# Patient Record
Sex: Female | Born: 1955 | Race: Black or African American | Hispanic: No | Marital: Single | Smoking: Former smoker
Health system: Southern US, Community
[De-identification: ages and names within clinical notes are randomized; demographics above are authoritative.]

## PROBLEM LIST (undated history)

## (undated) DIAGNOSIS — K635 Polyp of colon: Secondary | ICD-10-CM

## (undated) DIAGNOSIS — E785 Hyperlipidemia, unspecified: Secondary | ICD-10-CM

## (undated) DIAGNOSIS — Z87442 Personal history of urinary calculi: Secondary | ICD-10-CM

## (undated) DIAGNOSIS — R0600 Dyspnea, unspecified: Secondary | ICD-10-CM

## (undated) DIAGNOSIS — J189 Pneumonia, unspecified organism: Secondary | ICD-10-CM

## (undated) DIAGNOSIS — R911 Solitary pulmonary nodule: Secondary | ICD-10-CM

## (undated) DIAGNOSIS — J302 Other seasonal allergic rhinitis: Secondary | ICD-10-CM

## (undated) DIAGNOSIS — K219 Gastro-esophageal reflux disease without esophagitis: Secondary | ICD-10-CM

## (undated) DIAGNOSIS — I10 Essential (primary) hypertension: Secondary | ICD-10-CM

## (undated) HISTORY — PX: MULTIPLE TOOTH EXTRACTIONS: SHX2053

## (undated) HISTORY — DX: Essential (primary) hypertension: I10

## (undated) HISTORY — DX: Polyp of colon: K63.5

## (undated) HISTORY — PX: OTHER SURGICAL HISTORY: SHX169

---
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
## 1998-02-26 ENCOUNTER — Emergency Department (HOSPITAL_COMMUNITY): Admission: EM | Admit: 1998-02-26 | Discharge: 1998-02-26 | Payer: Self-pay | Admitting: Emergency Medicine

## 1998-08-31 ENCOUNTER — Emergency Department (HOSPITAL_COMMUNITY): Admission: EM | Admit: 1998-08-31 | Discharge: 1998-08-31 | Payer: Self-pay

## 2000-02-23 ENCOUNTER — Emergency Department (HOSPITAL_COMMUNITY): Admission: EM | Admit: 2000-02-23 | Discharge: 2000-02-23 | Payer: Self-pay

## 2001-01-18 ENCOUNTER — Emergency Department (HOSPITAL_COMMUNITY): Admission: EM | Admit: 2001-01-18 | Discharge: 2001-01-18 | Payer: Self-pay

## 2004-06-17 ENCOUNTER — Emergency Department (HOSPITAL_COMMUNITY): Admission: EM | Admit: 2004-06-17 | Discharge: 2004-06-17 | Payer: Self-pay | Admitting: Emergency Medicine

## 2005-03-24 ENCOUNTER — Emergency Department (HOSPITAL_COMMUNITY): Admission: EM | Admit: 2005-03-24 | Discharge: 2005-03-24 | Payer: Self-pay | Admitting: Emergency Medicine

## 2005-04-05 ENCOUNTER — Emergency Department (HOSPITAL_COMMUNITY): Admission: EM | Admit: 2005-04-05 | Discharge: 2005-04-05 | Payer: Self-pay | Admitting: Emergency Medicine

## 2005-07-26 ENCOUNTER — Emergency Department (HOSPITAL_COMMUNITY): Admission: EM | Admit: 2005-07-26 | Discharge: 2005-07-26 | Payer: Self-pay | Admitting: Emergency Medicine

## 2006-03-03 ENCOUNTER — Emergency Department (HOSPITAL_COMMUNITY): Admission: EM | Admit: 2006-03-03 | Discharge: 2006-03-03 | Payer: Self-pay | Admitting: Emergency Medicine

## 2006-06-08 ENCOUNTER — Emergency Department (HOSPITAL_COMMUNITY): Admission: EM | Admit: 2006-06-08 | Discharge: 2006-06-08 | Payer: Self-pay | Admitting: Family Medicine

## 2007-01-31 ENCOUNTER — Emergency Department (HOSPITAL_COMMUNITY): Admission: EM | Admit: 2007-01-31 | Discharge: 2007-01-31 | Payer: Self-pay | Admitting: Emergency Medicine

## 2007-07-26 ENCOUNTER — Emergency Department (HOSPITAL_COMMUNITY): Admission: EM | Admit: 2007-07-26 | Discharge: 2007-07-26 | Payer: Self-pay | Admitting: Emergency Medicine

## 2008-06-07 ENCOUNTER — Ambulatory Visit: Payer: Self-pay | Admitting: Internal Medicine

## 2008-06-07 ENCOUNTER — Inpatient Hospital Stay (HOSPITAL_COMMUNITY): Admission: EM | Admit: 2008-06-07 | Discharge: 2008-06-09 | Payer: Self-pay | Admitting: Emergency Medicine

## 2008-06-08 ENCOUNTER — Encounter: Payer: Self-pay | Admitting: Internal Medicine

## 2008-06-12 ENCOUNTER — Encounter (HOSPITAL_COMMUNITY): Admission: RE | Admit: 2008-06-12 | Discharge: 2008-09-10 | Payer: Self-pay | Admitting: Internal Medicine

## 2008-06-12 ENCOUNTER — Ambulatory Visit: Payer: Self-pay | Admitting: Cardiology

## 2008-08-16 ENCOUNTER — Ambulatory Visit: Payer: Self-pay | Admitting: Internal Medicine

## 2008-08-16 DIAGNOSIS — M25569 Pain in unspecified knee: Secondary | ICD-10-CM | POA: Insufficient documentation

## 2008-08-16 DIAGNOSIS — I10 Essential (primary) hypertension: Secondary | ICD-10-CM | POA: Insufficient documentation

## 2008-08-16 DIAGNOSIS — E785 Hyperlipidemia, unspecified: Secondary | ICD-10-CM

## 2009-01-02 ENCOUNTER — Encounter (INDEPENDENT_AMBULATORY_CARE_PROVIDER_SITE_OTHER): Payer: Self-pay | Admitting: *Deleted

## 2010-07-17 LAB — LIPID PANEL
Cholesterol: 209 mg/dL — ABNORMAL HIGH (ref 0–200)
HDL: 29 mg/dL — ABNORMAL LOW (ref >39)
LDL Cholesterol: 143 mg/dL — ABNORMAL HIGH (ref 0–99)
Triglycerides: 187 mg/dL — ABNORMAL HIGH (ref <150)

## 2010-07-17 LAB — URINALYSIS, ROUTINE W REFLEX MICROSCOPIC
Hgb urine dipstick: NEGATIVE
Ketones, ur: NEGATIVE mg/dL
Protein, ur: NEGATIVE mg/dL
Urobilinogen, UA: 0.2 mg/dL (ref 0.0–1.0)

## 2010-07-17 LAB — COMPREHENSIVE METABOLIC PANEL
Albumin: 4.2 g/dL (ref 3.5–5.2)
BUN: 5 mg/dL — ABNORMAL LOW (ref 6–23)
Creatinine, Ser: 0.7 mg/dL (ref 0.4–1.2)
Total Protein: 7.3 g/dL (ref 6.0–8.3)

## 2010-07-17 LAB — CBC
HCT: 39.4 % (ref 36.0–46.0)
MCV: 94.5 fL (ref 78.0–100.0)
Platelets: 298 10*3/uL (ref 150–400)
RDW: 13.8 % (ref 11.5–15.5)

## 2010-07-17 LAB — TSH: TSH: 2.018 u[IU]/mL (ref 0.350–4.500)

## 2010-07-17 LAB — CK TOTAL AND CKMB (NOT AT ARMC): Total CK: 182 U/L — ABNORMAL HIGH (ref 7–177)

## 2010-07-17 LAB — CARDIAC PANEL(CRET KIN+CKTOT+MB+TROPI): Troponin I: <0.01 ng/mL (ref 0.00–0.06)

## 2010-07-17 LAB — DIFFERENTIAL
Basophils Absolute: 0 10*3/uL (ref 0.0–0.1)
Lymphocytes Relative: 54 % — ABNORMAL HIGH (ref 12–46)
Monocytes Absolute: 0.3 10*3/uL (ref 0.1–1.0)
Monocytes Relative: 5 % (ref 3–12)
Neutro Abs: 2.5 10*3/uL (ref 1.7–7.7)

## 2010-07-17 LAB — POCT CARDIAC MARKERS
CKMB, poc: 1.6 ng/mL (ref 1.0–8.0)
Troponin i, poc: <0.05 ng/mL (ref 0.00–0.09)

## 2010-08-19 NOTE — Consult Note (Signed)
NAMESINDI, Tasha Nguyen                ACCOUNT NO.:  [id number]   MEDICAL RECORD NO.:  [id number]          PATIENT TYPE:  INP   LOCATION:  4712                         FACILITY:  [facility]   PHYSICIAN:  Doylene Canning. Ladona Ridgel, MD    DATE OF BIRTH:  11/28/1955   DATE OF CONSULTATION:  06/07/2008  DATE OF DISCHARGE:                                 CONSULTATION   The patient is referred today by Dr. Flonnie Overman for evaluation of chest pain  and make recommendations about her treatment.   The patient is a 55 year old woman who has a very strong family history  for coronary artery disease.  She also has a history of hypertension,  age undetermined.  She has a history of alcohol abuse and tobacco abuse  remotely.  The patient works 2 jobs and has done so for years.  She only  sleeps 3-4 hours at night.  She noted that over the last several days  that she has noted substernal chest discomfort which radiates to the  right arm.  This has been waxing and waning but worsened today and  resulted in her presenting to the hospital for additional evaluation  where she was treated with nitroglycerin with resolution of her pain.  The patient describes the pain is sharp and radiating to the right arm  and shoulder and axilla.  There is associated nausea, mild diaphoresis,  and shortness of breath.  The symptoms are not related to exertion.  The  patient is now here for additional evaluation.  She has never had frank  syncope.  She has rare palpitations.  She states that nothing makes the  pain worse or better.  She has otherwise been healthy.  Her additional  past medical history is notable for hypertension.  She notes that she  drinks 3-4 alcoholic beverages daily, sometimes more.  She has a history  of tobacco abuse but stopped smoking several months ago.   FAMILY HISTORY:  Father died of an MI in his 67s.  Her mother is alive  and well and healthy.  Her several siblings have had heart disease.   SOCIAL HISTORY:  The  patient is single.  She works 2 jobs.  Her alcohol  and tobacco abuse are previously described.  The patient does admit to  some marijuana use.   REVIEW OF SYSTEMS:  Negative except as noted in the HPI.  All systems  were reviewed and negative except as noted above.   PHYSICAL EXAMINATION:  GENERAL:  Notable for being a pleasant, middle-  aged woman in no acute distress.  VITAL SIGNS:  Blood pressure was initially 160/100, repeat blood  pressure several hours later after nitroglycerin was 130/90; pulse was  80 and regular; respirations were 18-20; the temperature was 98; and  oxygen saturation was 99%.  HEENT:  Normocephalic and atraumatic.  Pupils were equal and round.  Oropharynx is moist.  Sclerae anicteric.  The patient was wearing  glasses.  NECK:  No jugular venous distention.  There is no thyromegaly.  Trachea  was midline.  Carotids are 2+ and symmetric.  LUNGS:  Clear bilaterally to auscultation.  No wheezes, rales, or  rhonchi are present.  There is no increased work of breathing.  CARDIOVASCULAR:  Regular rate and rhythm.  Normal S1 and S2.  There is  soft S4 gallop.  There are no murmurs or rubs.  ABDOMEN:  Soft and  nontender.  There is no organomegaly.  Bowel sounds are present.  There  is no rebound or guarding.  EXTREMITIES:  Demonstrated no cyanosis, clubbing, or edema.  Pulses were  2+ and symmetric.  NEUROLOGIC:  Alert and oriented x3.  Cranial nerves II through XII  intact.  Strength is 5/5 and symmetric.   EKG demonstrates sinus rhythm.  There are no acute ST-T wave changes.  Labs of note include an initial cardiac markers being negative.  Sodium  was 140, potassium 3.7, glucose was 91, and creatinine 0.7.  Her  urinalysis was negative.  Her white blood cell count was normal.  Her  hemoglobin was 13.6.   IMPRESSION:  1. Chest pain with multiple features for unstable angina, also some      atypical features.  2. Hypertension.  3. Strong family history for  heart disease (premature heart disease).   DISCUSSION:  I have recommended that the patient be admitted and  observed on telemetry.  Serial cardiac enzymes and EKGs will be  recommended as well as a 2-D echo.  If her enzymes are negative and her  echo is okay, a stress Myoview will be performed perhaps even as an  outpatient.  I have encouraged the patient stopped drinking alcohol in  excess.      Doylene Canning. Ladona Ridgel, MD  Electronically Signed     GWT/MEDQ  D:  06/07/2008  T:  06/08/2008  Job:  [id number]

## 2010-08-19 NOTE — H&P (Signed)
Tasha Nguyen, Tasha Nguyen                ACCOUNT NO.:  [id number]   MEDICAL RECORD NO.:  [id number]          PATIENT TYPE:  EMS   LOCATION:  MAJO                         FACILITY:  [facility]   PHYSICIAN:  Lucita Ferrara, MD         DATE OF BIRTH:  1955-09-24   DATE OF ADMISSION:  06/07/2008  DATE OF DISCHARGE:                              HISTORY & PHYSICAL   PRIMARY CARE PHYSICIAN:  [facility]/unassigned.   HISTORY OF PRESENT ILLNESS:  The patient is a 55 year old African  American female with a chief complaint of chest pressure and chest  discomfort located in the left anterior precordial area, nonexertional,  but worse when she does exert herself and pressure-like in its  characterization.  It radiates to the shoulder as well, but  nonreproducible.  Not associated with p.o. intake, not associated with  gastroesophageal reflux disease-type symptoms.  There are no fevers,  cough.  Chest discomfort has been on and off for the last month, but  again started yesterday.  The patient's risk factors include a  significant family history of early premature coronary artery disease.  Risk factors include hypertension undiagnosed.  She currently does not smoke tobacco, but smokes cannabis.  She is an  occasional drinker.   SOCIAL HISTORY:  Former smoker, occasional drinker, cannabis abuse.   FAMILY HISTORY:  Significant for premature coronary artery disease and  diabetes.   PAST MEDICAL HISTORY:  None.   ALLERGIES:  NO KNOWN DRUG ALLERGIES.   MEDICATIONS:  None.   REVIEW OF SYSTEMS:  As per HPI, otherwise negative.   PHYSICAL EXAMINATION:  GENERAL:  The patient in no acute distress.  The  pain still persists, however.  VITAL SIGNS:  Blood pressure 164/95, pulse 79, respirations 18,  temperature 98.3, pulse ox 99% on room air.  HEENT:  Normocephalic, atraumatic.  Sclerae anicteric.  Mucous membranes  are moist.  NECK:  Supple.  No JVD.  CARDIOVASCULAR:  S1 and S2.  Regular rate and rhythm.  No  murmurs, rubs  or clicks.  LUNGS:  Clear to auscultation bilaterally.  No rhonchi or wheezes.  ABDOMEN:  Soft, nontender, nondistended.  Positive bowel sounds.  EXTREMITIES:  No clubbing, cyanosis or edema.  NEURO:  Patient is alert and oriented x3.  Cranial nerves II-XII are  grossly intact.   LABORATORY DATA:  Urinalysis negative.  Cardiac enzymes x1 negative.  Complete metabolic panel, AST, ALT, alk phos and electrolytes within  normal limits.  CBC normal.   DIAGNOSTICS:  Chest x-ray:  No cardiopulmonary disease.   ASSESSMENT:  The patient is a 55 year old African American female with  chest pain that is characterized as pressure-like.  Her risk factors  include undiagnosed hypertension, family history of coronary disease.   PLAN:  She will be admitted to the medical telemetry unit.  Cardiac  enzymes x3 q.8 h.  I have already asked [facility] to come and  see her, and proceed with appropriate measures including a possible  nuclear stress test.  Fasting lipid profile and TSH in the morning.  DVT  and GI prophylaxis  with Lovenox and Protonix.      Lucita Ferrara, MD  Electronically Signed     RR/MEDQ  D:  06/07/2008  T:  06/07/2008  Job:  [id number]

## 2010-08-19 NOTE — Discharge Summary (Signed)
Tasha Nguyen, Tasha Nguyen                ACCOUNT NO.:  [id number]   MEDICAL RECORD NO.:  [id number]          PATIENT TYPE:  INP   LOCATION:  4712                         FACILITY:  [facility]   PHYSICIAN:  Michelene Gardener, MD    DATE OF BIRTH:  1956/02/06   DATE OF ADMISSION:  06/07/2008  DATE OF DISCHARGE:  06/09/2008                               DISCHARGE SUMMARY   PRIMARY PHYSICIAN:  [facility].   DISCHARGE DIAGNOSES:  1. Chest pain.  2. Hypertension.  3. Hyperlipidemia, which is diet controlled.  4. Positive family history of heart disease.   DISCHARGE MEDICATIONS:  1. Aspirin 81 mg once a day.  2. Toprol-XL 25 mg once a day.   CONSULTATIONS:  Cardiology consult by Dr. Lewayne Bunting.   PROCEDURES:  None.   RADIOLOGY STUDIES:  1. Chest x-ray on June 07, 2008, showed no active disease.  2. Echocardiogram on June 08, 2008, showed ejection fraction of 55-65%      without evidence of wall motion abnormalities.   COURSE OF HOSPITALIZATION:  1. Chest pain.  This patient had some risk factors that include      hypertension, hyperlipidemia, which is diet controlled and positive      family history.  The patient was admitted to the hospital for      further evaluation.  She was monitored in telemetry and her tele      monitor showed no evidence of arrhythmia.  Three sets of troponin      and cardiac enzymes were negative.  Echocardiogram was done and      showed normal ejection fraction without evidence of wall motion      abnormalities.  Cardiology consultation was done by [facility]      Cardiology.  Cardiology discussed the case with the patient on the      time of discharge, and the patient chose to go home and to come      back for a stress test next week.  Stress test was scheduled on      Tuesday and as per Cardiology that will be reviewed by Dr. Ladona Ridgel.  2. Hypertension.  The patient was started on Toprol-XL that gave      better control.  3. Hyperlipidemia.  That was diet  controlled and the patient was      advised about lifestyle modifications.   Otherwise other medical conditions remained stable.  At the time of  discharge, the patient was in stable condition.  No chest pain and no  shortness of breath.  She was advised to come to the ER if she developed  symptoms such as chest pain or shortness of breath.   TOTAL ASSESSMENT TIME:  40 minutes.      Michelene Gardener, MD  Electronically Signed     NAE/MEDQ  D:  06/09/2008  T:  06/10/2008  Job:  [phone]

## 2010-12-30 LAB — POCT RAPID STREP A: Streptococcus, Group A Screen (Direct): POSITIVE — AB

## 2011-03-17 ENCOUNTER — Other Ambulatory Visit (HOSPITAL_COMMUNITY)
Admission: RE | Admit: 2011-03-17 | Discharge: 2011-03-17 | Disposition: A | Discharge status: Home / Self Care | Payer: BC Managed Care – PPO | Source: Ambulatory Visit | Attending: Family Medicine | Admitting: Family Medicine

## 2011-03-17 DIAGNOSIS — Z01419 Encounter for gynecological examination (general) (routine) without abnormal findings: Secondary | ICD-10-CM | POA: Insufficient documentation

## 2013-10-25 ENCOUNTER — Ambulatory Visit (INDEPENDENT_AMBULATORY_CARE_PROVIDER_SITE_OTHER): Payer: Self-pay | Admitting: Family Medicine

## 2013-10-25 VITALS — BP 138/84 | HR 82 | Temp 98.0°F | Resp 16 | Ht 68.5 in | Wt 132.6 lb

## 2013-10-25 DIAGNOSIS — S41102A Unspecified open wound of left upper arm, initial encounter: Secondary | ICD-10-CM

## 2013-10-25 DIAGNOSIS — M79602 Pain in left arm: Secondary | ICD-10-CM

## 2013-10-25 DIAGNOSIS — S41109A Unspecified open wound of unspecified upper arm, initial encounter: Secondary | ICD-10-CM

## 2013-10-25 DIAGNOSIS — M79609 Pain in unspecified limb: Secondary | ICD-10-CM
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
## 2013-10-25 NOTE — Patient Instructions (Signed)
Keep your arm covered while at work, return in one week to take out the stitches. Please come in sooner if he sees swelling, increasing pain or redness.

## 2013-10-25 NOTE — Progress Notes (Signed)
Patient ID: Tasha Nguyen MRN: [id number], DOB: 11/25/1955, 58 y.o. Date of Encounter: 10/25/2013, 7:11 PM   PROCEDURE NOTE: Verbal consent obtained. Sterile technique employed. Numbing: Anesthesia obtained with 2% lidocaine with epinephrine.   Cleansed with soap and water. Irrigated.  Wound explored, no deep structures involved, no foreign bodies.   Wound repaired with # 5 SI sutures.  Hemostasis obtained. Wound cleansed and dressed.  Wound care instructions including precautions covered with patient. Handout given.  Anticipate suture removal in 7-10 days  Georgiann Mccoy, PA-C 10/25/2013 7:11 PM

## 2013-11-06 ENCOUNTER — Ambulatory Visit (INDEPENDENT_AMBULATORY_CARE_PROVIDER_SITE_OTHER): Payer: Self-pay | Admitting: Physician Assistant

## 2013-11-06 VITALS — BP 100/64 | HR 75 | Temp 98.2°F | Resp 16 | Ht 68.5 in | Wt 134.0 lb

## 2013-11-06 DIAGNOSIS — S41102D Unspecified open wound of left upper arm, subsequent encounter: Secondary | ICD-10-CM

## 2013-11-06 DIAGNOSIS — S41109A Unspecified open wound of unspecified upper arm, initial encounter: Secondary | ICD-10-CM

## 2013-11-06 DIAGNOSIS — Z5189 Encounter for other specified aftercare: Secondary | ICD-10-CM

## 2013-11-06 NOTE — Progress Notes (Signed)
   Subjective:    Patient ID: Tasha Nguyen, female    DOB: June 07, 1955, 58 y.o.   MRN: [id number]  HPI  Pt presents to clinic for suture removal.  He has had no problems with the wound but a little mild tenderness.    Review of Systems     Objective:   Physical Exam  Vitals reviewed. Constitutional: She is oriented to person, place, and time. She appears well-developed and well-nourished.  HENT:  Head: Normocephalic and atraumatic.  Right Ear: External ear normal.  Left Ear: External ear normal.  Pulmonary/Chest: Effort normal.  Neurological: She is alert and oriented to person, place, and time.  Skin: Skin is warm and dry.  3 cm wound healed on arm without signs of infection.  Some mild swelling present.   Sutures removed without difficulty but center of wound slightly dehisced on the epidermis so steri strips placed to prevent opening of the wound.    Assessment & Plan:  Arm wound, left, subsequent encounter  - well healed wound on her arm without signs of infection - wound care d/w pt  Windell Hummingbird PA-C  [facility] 11/06/2013 4:37 PM

## 2013-12-30 ENCOUNTER — Emergency Department (HOSPITAL_COMMUNITY)
Admission: EM | Admit: 2013-12-30 | Discharge: 2013-12-30 | Disposition: A | Discharge status: Home / Self Care | Payer: BC Managed Care – PPO | Attending: Emergency Medicine | Admitting: Emergency Medicine

## 2013-12-30 ENCOUNTER — Emergency Department (HOSPITAL_COMMUNITY): Payer: BC Managed Care – PPO

## 2013-12-30 ENCOUNTER — Encounter (HOSPITAL_COMMUNITY): Payer: Self-pay | Admitting: Emergency Medicine

## 2013-12-30 DIAGNOSIS — S4980XA Other specified injuries of shoulder and upper arm, unspecified arm, initial encounter: Secondary | ICD-10-CM | POA: Insufficient documentation

## 2013-12-30 DIAGNOSIS — M25512 Pain in left shoulder: Secondary | ICD-10-CM

## 2013-12-30 DIAGNOSIS — Y9389 Activity, other specified: Secondary | ICD-10-CM | POA: Insufficient documentation

## 2013-12-30 DIAGNOSIS — S46909A Unspecified injury of unspecified muscle, fascia and tendon at shoulder and upper arm level, unspecified arm, initial encounter: Secondary | ICD-10-CM | POA: Insufficient documentation

## 2013-12-30 DIAGNOSIS — Y92410 Unspecified street and highway as the place of occurrence of the external cause: Secondary | ICD-10-CM | POA: Insufficient documentation

## 2013-12-30 MED ORDER — IBUPROFEN 800 MG PO TABS: 800.0000 mg | ORAL_TABLET | Freq: Three times a day (TID) | ORAL | Status: DC | PRN

## 2013-12-30 MED ORDER — IBUPROFEN 800 MG PO TABS
800.0000 mg | ORAL_TABLET | Freq: Once | ORAL | Status: AC
  Administered 2013-12-30: 800 mg via ORAL
  Filled 2013-12-30: qty 1
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
## 2013-12-30 NOTE — ED Notes (Signed)
Patient transported to X-ray 
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
## 2014-07-12 ENCOUNTER — Encounter (HOSPITAL_COMMUNITY): Payer: Self-pay | Admitting: Emergency Medicine

## 2014-07-12 ENCOUNTER — Emergency Department (HOSPITAL_COMMUNITY)
Admission: EM | Admit: 2014-07-12 | Discharge: 2014-07-13 | Disposition: A | Discharge status: Home / Self Care | Payer: BC Managed Care – PPO | Attending: Emergency Medicine | Admitting: Emergency Medicine

## 2014-07-12 ENCOUNTER — Emergency Department (HOSPITAL_COMMUNITY): Payer: BC Managed Care – PPO

## 2014-07-12 DIAGNOSIS — J209 Acute bronchitis, unspecified: Secondary | ICD-10-CM

## 2014-07-12 DIAGNOSIS — R197 Diarrhea, unspecified: Secondary | ICD-10-CM | POA: Insufficient documentation

## 2014-07-12 DIAGNOSIS — R110 Nausea: Secondary | ICD-10-CM | POA: Insufficient documentation

## 2014-07-12 NOTE — ED Notes (Signed)
Pt states she has had a cough for the past 5 days or so  Pt states this evening she was coughing and coughed up about 2 tablespoons of bright red blood  Pt states she felt like a quiver in her chest then started coughing and that's when the blood came up

## 2014-07-13 LAB — CBC WITH DIFFERENTIAL/PLATELET
Basophils Absolute: 0 10*3/uL (ref 0.0–0.1)
Basophils Relative: 0 % (ref 0–1)
EOS ABS: 0 10*3/uL (ref 0.0–0.7)
EOS PCT: 1 % (ref 0–5)
HCT: 39.4 % (ref 36.0–46.0)
HEMOGLOBIN: 13.1 g/dL (ref 12.0–15.0)
Lymphocytes Relative: 40 % (ref 12–46)
Lymphs Abs: 3.2 10*3/uL (ref 0.7–4.0)
MCH: 32.1 pg (ref 26.0–34.0)
MCHC: 33.2 g/dL (ref 30.0–36.0)
MCV: 96.6 fL (ref 78.0–100.0)
MONO ABS: 0.4 10*3/uL (ref 0.1–1.0)
MONOS PCT: 5 % (ref 3–12)
Neutro Abs: 4.3 10*3/uL (ref 1.7–7.7)
Neutrophils Relative %: 54 % (ref 43–77)
Platelets: 367 10*3/uL (ref 150–400)
RBC: 4.08 MIL/uL (ref 3.87–5.11)
RDW: 13.6 % (ref 11.5–15.5)
WBC: 7.9 10*3/uL (ref 4.0–10.5)

## 2014-07-13 LAB — BASIC METABOLIC PANEL
ANION GAP: 9 (ref 5–15)
BUN: 14 mg/dL (ref 6–23)
CO2: 24 mmol/L (ref 19–32)
Calcium: 9.4 mg/dL (ref 8.4–10.5)
Chloride: 108 mmol/L (ref 96–112)
Creatinine, Ser: 0.6 mg/dL (ref 0.50–1.10)
GFR calc Af Amer: >90 mL/min (ref >90)
Glucose, Bld: 97 mg/dL (ref 70–99)
Potassium: 3.9 mmol/L (ref 3.5–5.1)
Sodium: 141 mmol/L (ref 135–145)

## 2014-07-13 LAB — D-DIMER, QUANTITATIVE (NOT AT ARMC)

## 2014-07-13 MED ORDER — NAPROXEN 500 MG PO TABS: 500.0000 mg | ORAL_TABLET | Freq: Two times a day (BID) | ORAL | Status: DC

## 2014-07-13 MED ORDER — PROMETHAZINE HCL 25 MG PO TABS
25.0000 mg | ORAL_TABLET | Freq: Four times a day (QID) | ORAL | Status: DC | PRN
Start: 2014-07-13 — End: 2014-10-24

## 2014-07-13 MED ORDER — BENZONATATE 100 MG PO CAPS: 100.0000 mg | ORAL_CAPSULE | Freq: Three times a day (TID) | ORAL | Status: DC

## 2014-07-13 NOTE — ED Notes (Addendum)
Per pt, night sweats, "hard to catch breath"  5 days along with cough. She cough twice and noticed blood. She estimated to be a tsp. Denies dizziness. Family at bedside.

## 2014-07-13 NOTE — ED Notes (Signed)
Pt verbalized understanding of discharge instructions. Pt ambulated to exit with family, moving all extremities well.

## 2014-07-13 NOTE — Discharge Instructions (Signed)
Acute Bronchitis Bronchitis is inflammation of the airways that extend from the windpipe into the lungs (bronchi). The inflammation often causes mucus to develop. This leads to a cough, which is the most common symptom of bronchitis.  In acute bronchitis, the condition usually develops suddenly and goes away over time, usually in a couple weeks. Smoking, allergies, and asthma can make bronchitis worse. Repeated episodes of bronchitis may cause further lung problems.  CAUSES Acute bronchitis is most often caused by the same virus that causes a cold. The virus can spread from person to person (contagious) through coughing, sneezing, and touching contaminated objects. SIGNS AND SYMPTOMS   Cough.   Fever.   Coughing up mucus.   Body aches.   Chest congestion.   Chills.   Shortness of breath.   Sore throat.  DIAGNOSIS  Acute bronchitis is usually diagnosed through a physical exam. Your health care provider will also ask you questions about your medical history. Tests, such as chest X-rays, are sometimes done to rule out other conditions.  TREATMENT  Acute bronchitis usually goes away in a couple weeks. Oftentimes, no medical treatment is necessary. Medicines are sometimes given for relief of fever or cough. Antibiotic medicines are usually not needed but may be prescribed in certain situations. In some cases, an inhaler may be recommended to help reduce shortness of breath and control the cough. A cool mist vaporizer may also be used to help thin bronchial secretions and make it easier to clear the chest.  HOME CARE INSTRUCTIONS  Get plenty of rest.   Drink enough fluids to keep your urine clear or pale yellow (unless you have a medical condition that requires fluid restriction). Increasing fluids may help thin your respiratory secretions (sputum) and reduce chest congestion, and it will prevent dehydration.   Take medicines only as directed by your health care provider.  If  you were prescribed an antibiotic medicine, finish it all even if you start to feel better.  Avoid smoking and secondhand smoke. Exposure to cigarette smoke or irritating chemicals will make bronchitis worse. If you are a smoker, consider using nicotine gum or skin patches to help control withdrawal symptoms. Quitting smoking will help your lungs heal faster.   Reduce the chances of another bout of acute bronchitis by washing your hands frequently, avoiding people with cold symptoms, and trying not to touch your hands to your mouth, nose, or eyes.   Keep all follow-up visits as directed by your health care provider.  SEEK MEDICAL CARE IF: Your symptoms do not improve after 1 week of treatment.  SEEK IMMEDIATE MEDICAL CARE IF:  You develop an increased fever or chills.   You have chest pain.   You have severe shortness of breath.  You have bloody sputum.   You develop dehydration.  You faint or repeatedly feel like you are going to pass out.  You develop repeated vomiting.  You develop a severe headache. MAKE SURE YOU:   Understand these instructions.  Will watch your condition.  Will get help right away if you are not doing well or get worse. Document Released: 04/30/2004 Document Revised: 08/07/2013 Document Reviewed: 09/13/2012 [facility] Patient Information 2015 [place], [place]. This information is not intended to replace advice given to you by your health care provider. Make sure you discuss any questions you have with your health care provider.   Viral Infections A viral infection can be caused by different types of viruses.Most viral infections are not serious and resolve on their  own. However, some infections may cause severe symptoms and may lead to further complications. SYMPTOMS Viruses can frequently cause:  Minor sore throat.  Aches and pains.  Headaches.  Runny nose.  Different types of rashes.  Watery eyes.  Tiredness.  Cough.  Loss of  appetite.  Gastrointestinal infections, resulting in nausea, vomiting, and diarrhea. These symptoms do not respond to antibiotics because the infection is not caused by bacteria. However, you might catch a bacterial infection following the viral infection. This is sometimes called a "superinfection." Symptoms of such a bacterial infection may include:  Worsening sore throat with pus and difficulty swallowing.  Swollen neck glands.  Chills and a high or persistent fever.  Severe headache.  Tenderness over the sinuses.  Persistent overall ill feeling (malaise), muscle aches, and tiredness (fatigue).  Persistent cough.  Yellow, green, or [place] mucus production with coughing. HOME CARE INSTRUCTIONS   Only take over-the-counter or prescription medicines for pain, discomfort, diarrhea, or fever as directed by your caregiver.  Drink enough water and fluids to keep your urine clear or pale yellow. Sports drinks can provide valuable electrolytes, sugars, and hydration.  Get plenty of rest and maintain proper nutrition. Soups and broths with crackers or rice are fine. SEEK IMMEDIATE MEDICAL CARE IF:   You have severe headaches, shortness of breath, chest pain, neck pain, or an unusual rash.  You have uncontrolled vomiting, diarrhea, or you are unable to keep down fluids.  You or your child has an oral temperature above 102 F (38.9 C), not controlled by medicine.  Your baby is older than 3 months with a rectal temperature of 102 F (38.9 C) or higher.  Your baby is 38 months old or younger with a rectal temperature of 100.4 F (38 C) or higher. MAKE SURE YOU:   Understand these instructions.  Will watch your condition.  Will get help right away if you are not doing well or get worse. Document Released: 12/31/2004 Document Revised: 06/15/2011 Document Reviewed: 07/28/2010 [facility] Patient Information 2015 [place], [place]. This information is not intended to replace advice given  to you by your health care provider. Make sure you discuss any questions you have with your health care provider.

## 2014-07-13 NOTE — ED Notes (Signed)
Claiborne Billings PA at bedside with pt and family at this time

## 2014-07-13 NOTE — ED Provider Notes (Signed)
CSN: [id number]     Arrival date & time 07/12/14  2216 History   First MD Initiated Contact with Patient 07/13/14 0309     Chief Complaint  Patient presents with  . Hemoptysis    (Consider location/radiation/quality/duration/timing/severity/associated sxs/prior Treatment) HPI Comments: Patient is a 59 year old female with no significant past medical history who presents to the emergency department for further evaluation of hemoptysis. Patient states that she has had a "cold" over the past 5 days for which she has been taking TheraFlu. She reports that TheraFlu has been helping her cough, but has not been providing relief to her associated diarrhea, nausea, and sweats. She reports a mild shortness of breath when coughing. She states that, yesterday evening, she coughed up approximately 2 tablespoons of bright red blood followed by mucus. She denies any recurrence of these symptoms since arrival in the ED. Patient states that she has been around sick contacts at work. She denies associated fever, leg swelling, loss of consciousness, vomiting, recent travel, and recent surgeries or hospitalizations. Patient denies a history of DVT/PE.  The history is provided by the patient. No language interpreter was used.    History reviewed. No pertinent past medical history. History reviewed. No pertinent past surgical history. Family History  Problem Relation Age of Onset  . CAD Father   . CAD Sister   . CAD Other    History  Substance Use Topics  . Smoking status: Never Smoker   . Smokeless tobacco: Not on file  . Alcohol Use: Yes     Comment: weekends   OB History    No data available      Review of Systems  Constitutional: Positive for diaphoresis. Negative for fever.  HENT: Negative for sore throat and trouble swallowing.   Respiratory: Positive for cough and shortness of breath.   Cardiovascular: Negative for chest pain and leg swelling.  Gastrointestinal: Positive for nausea and  diarrhea. Negative for vomiting.  Neurological: Negative for syncope.  All other systems reviewed and are negative.   Allergies  Oxycodone  Home Medications   Prior to Admission medications   Medication Sig Start Date End Date Taking? Authorizing Provider  Chlorphen-Pseudoephed-APAP (THERAFLU FLU/COLD PO) Take 1 packet by mouth every 4 (four) hours as needed (cough).   Yes Historical Provider, MD  benzonatate (TESSALON) 100 MG capsule Take 1 capsule (100 mg total) by mouth every 8 (eight) hours. 07/13/14   Antonietta Breach, PA-C  ibuprofen (ADVIL,MOTRIN) 800 MG tablet Take 1 tablet (800 mg total) by mouth every 8 (eight) hours as needed for moderate pain. Patient not taking: Reported on 07/13/2014 12/30/13   Everlene Balls, MD  naproxen (NAPROSYN) 500 MG tablet Take 1 tablet (500 mg total) by mouth 2 (two) times daily. 07/13/14   Antonietta Breach, PA-C  promethazine (PHENERGAN) 25 MG tablet Take 1 tablet (25 mg total) by mouth every 6 (six) hours as needed for nausea or vomiting. 07/13/14   Antonietta Breach, PA-C   BP 131/95 mmHg  Pulse 79  Temp(Src) 98.3 F (36.8 C) (Oral)  Resp 18  SpO2 98%   Physical Exam  Constitutional: She is oriented to person, place, and time. She appears well-developed and well-nourished. No distress.  Nontoxic/nonseptic appearing  HENT:  Head: Normocephalic and atraumatic.  Mouth/Throat: Oropharynx is clear and moist. No oropharyngeal exudate.  Oropharynx clear. Uvula midline. No posterior oropharyngeal erythema.  Eyes: Conjunctivae and EOM are normal. Pupils are equal, round, and reactive to light. No scleral icterus.  Neck: Normal  range of motion.  Cardiovascular: Normal rate, regular rhythm and normal heart sounds.   Pulmonary/Chest: Effort normal and breath sounds normal. No respiratory distress. She has no wheezes. She has no rales.  Lungs clear. No tachypnea or dyspnea.  Musculoskeletal: Normal range of motion.  Neurological: She is alert and oriented to person, place,  and time. She exhibits normal muscle tone. Coordination normal.  GCS 15. Patient moves extremities without ataxia.  Skin: Skin is warm and dry. No rash noted. She is not diaphoretic. No erythema. No pallor.  Psychiatric: She has a normal mood and affect. Her behavior is normal.  Nursing note and vitals reviewed.   ED Course  Procedures (including critical care time) Labs Review Labs Reviewed  D-DIMER, QUANTITATIVE  CBC WITH DIFFERENTIAL/PLATELET  BASIC METABOLIC PANEL    Imaging Review Dg Chest 2 View  07/12/2014   CLINICAL DATA:  Cough and cold symptoms  EXAM: CHEST  2 VIEW  COMPARISON:  06/07/2008  FINDINGS: The heart size and mediastinal contours are within normal limits. Both lungs are clear. The visualized skeletal structures are unremarkable.  IMPRESSION: No active cardiopulmonary disease.   Electronically Signed   By: Inez Catalina M.D.   On: 07/12/2014 23:01     EKG Interpretation None      MDM   Final diagnoses:  Acute bronchitis, unspecified organism    59 year old female presents to the emergency department for further evaluation of hemoptysis in the setting of upper respiratory symptoms and cough. Symptoms likely secondary to a viral process and acute bronchitis. Doubt pulmonary embolus and d-dimer is negative today. Lungs CTAB. No hypoxia. Patient has no anemia or signs of acute blood loss such as tachycardia, hypotension, or elevated BUN. She has no leukocytosis or fever today. Patient is, overall, well-appearing. She is pleasant.  Patient to be discharged with instructions for supportive treatment as well as primary care follow-up for a recheck of symptoms. Return precautions discussed and provided. Patient agreeable to plan with no unaddressed concerns. Patient discharged in good condition; VSS.   Filed Vitals:   07/12/14 2231 07/13/14 0133 07/13/14 0432  BP: 159/92 149/90 131/95  Pulse: 90 75 79  Temp: 98.3 F (36.8 C)    TempSrc: Oral    Resp: 20 18    SpO2: 100% 100% 98%     Antonietta Breach, PA-C 07/13/14 0535  Veatrice Kells, MD 07/13/14 [phone]

## 2014-10-24 ENCOUNTER — Emergency Department (HOSPITAL_COMMUNITY)
Admission: EM | Admit: 2014-10-24 | Discharge: 2014-10-24 | Disposition: A | Discharge status: Home / Self Care | Payer: No Typology Code available for payment source | Source: Home / Self Care

## 2014-10-24 ENCOUNTER — Encounter (HOSPITAL_COMMUNITY): Payer: Self-pay | Admitting: Emergency Medicine

## 2014-10-24 DIAGNOSIS — B070 Plantar wart: Secondary | ICD-10-CM | POA: Diagnosis not present

## 2014-10-24 DIAGNOSIS — L84 Corns and callosities: Secondary | ICD-10-CM

## 2014-10-24 DIAGNOSIS — M79675 Pain in left toe(s): Secondary | ICD-10-CM

## 2014-10-24 NOTE — ED Notes (Signed)
C/o left 5th toe bunion/pain onset 5 days Denies inj/trauma Alert, no signs of acute distress.

## 2014-10-24 NOTE — ED Provider Notes (Signed)
CSN: [id number]     Arrival date & time 10/24/14  1623 History   None    Chief Complaint  Patient presents with  . Toe Pain   (Consider location/radiation/quality/duration/timing/severity/associated sxs/prior Treatment) HPI  L 5th toe pain. Started 1 week ago after patient had a pedicure done. Toe has become significant swollen and very tender to palpation. Patient has tried Epsom salt soaks as well as changing her insoles to Dr. Felicie Morn without relief. Denies any previous callus/corn formation on the toe. Denies surrounding toe pain fevers, nausea, vomiting, chest pain, shortness of breath, palpitations, rash.  History reviewed. No pertinent past medical history. History reviewed. No pertinent past surgical history. Family History  Problem Relation Age of Onset  . CAD Father   . CAD Sister   . CAD Other   . Diabetes Other    History  Substance Use Topics  . Smoking status: Never Smoker   . Smokeless tobacco: Not on file  . Alcohol Use: Yes     Comment: weekends   OB History    No data available     Review of Systems Per HPI with all other pertinent systems negative.   Allergies  Oxycodone  Home Medications   Prior to Admission medications   Not on File   BP 151/87 mmHg  Pulse 65  Temp(Src) 97.9 F (36.6 C) (Oral)  Resp 18  SpO2 100% Physical Exam Physical Exam  Constitutional: oriented to person, place, and time. appears well-developed and well-nourished. No distress.  HENT:  Head: Normocephalic and atraumatic.  Eyes: EOMI. PERRL.  Neck: Normal range of motion.  Cardiovascular: RRR, no m/r/g, 2+ distal pulses,  Pulmonary/Chest: Effort normal and breath sounds normal. No respiratory distress.  Abdominal: Soft. Bowel sounds are normal. NonTTP, no distension.  Musculoskeletal: Left pinky toe with lateral 1 x 2 cm firm pale lesion. No central fluctuance. Tender to palpation. Surrounding tenderness to palpation without induration.  Neurological: alert and  oriented to person, place, and time.  Skin: Skin is warm. No rash noted. non diaphoretic.  Psychiatric: normal mood and affect. behavior is normal. Judgment and thought content normal.   ED Course  Procedures (including critical care time) Labs Review Labs Reviewed - No data to display  Imaging Review No results found.   MDM  No diagnosis found. After obtaining verbal consent. Digital block was performed and a 15 blade was used to begin taking layers of the callus off. Total lesion size was 1.5 x 1.8 cm. A central core was found which was spongy in nature and using pen cautery this was "out in successive treatments with cautery and 15 blade. Healthy tissue was found at the base of the lesion. No purulence was ever noted. Discussed extensively with patient treatment of calluses and basic plantar warts. Patient given detailed wound care instructions. Anabolic ointment and a sterile bandage was applied.    Waldemar Dickens, MD 10/24/14 [phone]

## 2014-10-24 NOTE — ED Notes (Signed)
Applied bacitracin and covered w/bandaid

## 2014-10-24 NOTE — Discharge Instructions (Signed)
Please apply antibiotic ointment twice daily over the next several days as well as a sterile bandage to your toe. After approximately 1 week you can start using an emery board and lotion to the toe to help keep it soft and smooth. He may need follow-up with a podiatrist for this problem in the future.

## 2014-11-06 ENCOUNTER — Encounter (HOSPITAL_COMMUNITY): Payer: Self-pay | Admitting: Emergency Medicine

## 2014-11-06 ENCOUNTER — Emergency Department (HOSPITAL_COMMUNITY)
Admission: EM | Admit: 2014-11-06 | Discharge: 2014-11-06 | Disposition: A | Discharge status: Home / Self Care | Payer: No Typology Code available for payment source | Attending: Emergency Medicine | Admitting: Emergency Medicine

## 2014-11-06 ENCOUNTER — Emergency Department (HOSPITAL_COMMUNITY): Payer: No Typology Code available for payment source

## 2014-11-06 DIAGNOSIS — M25542 Pain in joints of left hand: Secondary | ICD-10-CM

## 2014-11-06 DIAGNOSIS — Y998 Other external cause status: Secondary | ICD-10-CM | POA: Insufficient documentation

## 2014-11-06 DIAGNOSIS — M25521 Pain in right elbow: Secondary | ICD-10-CM

## 2014-11-06 DIAGNOSIS — S6992XA Unspecified injury of left wrist, hand and finger(s), initial encounter: Secondary | ICD-10-CM | POA: Insufficient documentation

## 2014-11-06 DIAGNOSIS — S4991XA Unspecified injury of right shoulder and upper arm, initial encounter: Secondary | ICD-10-CM | POA: Diagnosis not present

## 2014-11-06 DIAGNOSIS — M25511 Pain in right shoulder: Secondary | ICD-10-CM

## 2014-11-06 DIAGNOSIS — Y92410 Unspecified street and highway as the place of occurrence of the external cause: Secondary | ICD-10-CM | POA: Insufficient documentation

## 2014-11-06 DIAGNOSIS — Y9389 Activity, other specified: Secondary | ICD-10-CM | POA: Diagnosis not present

## 2014-11-06 MED ORDER — METHOCARBAMOL 500 MG PO TABS
500.0000 mg | ORAL_TABLET | Freq: Once | ORAL | Status: AC
  Administered 2014-11-06: 500 mg via ORAL
  Filled 2014-11-06: qty 1

## 2014-11-06 MED ORDER — METHOCARBAMOL 500 MG PO TABS: 500.0000 mg | ORAL_TABLET | Freq: Two times a day (BID) | ORAL | Status: DC

## 2014-11-06 MED ORDER — ACETAMINOPHEN 500 MG PO TABS
1000.0000 mg | ORAL_TABLET | Freq: Once | ORAL | Status: AC
  Administered 2014-11-06: 1000 mg via ORAL
  Filled 2014-11-06: qty 2

## 2014-11-06 MED ORDER — NAPROXEN 500 MG PO TABS: 500.0000 mg | ORAL_TABLET | Freq: Two times a day (BID) | ORAL | Status: DC

## 2014-11-06 NOTE — ED Notes (Signed)
Spoke with Ortho.
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
## 2014-11-06 NOTE — Progress Notes (Signed)
Orthopedic Tech Progress Note Patient Details:  Tasha Nguyen 09-11-1955 [id number]  Ortho Devices Type of Ortho Device: Thumb velcro splint Ortho Device/Splint Location: LUE Ortho Device/Splint Interventions: Ordered, Application   Braulio Bosch 11/06/2014, 3:35 PM

## 2014-11-06 NOTE — ED Notes (Signed)
Patient transported to X-ray 

## 2014-11-06 NOTE — Discharge Instructions (Signed)
1. Medications: robaxin, naproxyn, usual home medications 2. Treatment: rest, drink plenty of fluids, gentle stretching as discussed, alternate ice and heat 3. Follow Up: Please followup with your primary doctor in 3 days for discussion of your diagnoses and further evaluation after today's visit; if you do not have a primary care doctor use the resource guide provided to find one;  Return to the ER for development of severe back pain, difficulty walking, loss of bowel or bladder control or other worsening or concerning symptoms    Arthralgia Your caregiver has diagnosed you as suffering from an arthralgia. Arthralgia means there is pain in a joint. This can come from many reasons including:  Bruising the joint which causes soreness (inflammation) in the joint.  Wear and tear on the joints which occur as we grow older (osteoarthritis).  Overusing the joint.  Various forms of arthritis.  Infections of the joint. Regardless of the cause of pain in your joint, most of these different pains respond to anti-inflammatory drugs and rest. The exception to this is when a joint is infected, and these cases are treated with antibiotics, if it is a bacterial infection. HOME CARE INSTRUCTIONS   Rest the injured area for as long as directed by your caregiver. Then slowly start using the joint as directed by your caregiver and as the pain allows. Crutches as directed may be useful if the ankles, knees or hips are involved. If the knee was splinted or casted, continue use and care as directed. If an stretchy or elastic wrapping bandage has been applied today, it should be removed and re-applied every 3 to 4 hours. It should not be applied tightly, but firmly enough to keep swelling down. Watch toes and feet for swelling, bluish discoloration, coldness, numbness or excessive pain. If any of these problems (symptoms) occur, remove the ace bandage and re-apply more loosely. If these symptoms persist, contact your  caregiver or return to this location.  For the first 24 hours, keep the injured extremity elevated on pillows while lying down.  Apply ice for 15-20 minutes to the sore joint every couple hours while awake for the first half day. Then 03-04 times per day for the first 48 hours. Put the ice in a plastic bag and place a towel between the bag of ice and your skin.  Wear any splinting, casting, elastic bandage applications, or slings as instructed.  Only take over-the-counter or prescription medicines for pain, discomfort, or fever as directed by your caregiver. Do not use aspirin immediately after the injury unless instructed by your physician. Aspirin can cause increased bleeding and bruising of the tissues.  If you were given crutches, continue to use them as instructed and do not resume weight bearing on the sore joint until instructed. Persistent pain and inability to use the sore joint as directed for more than 2 to 3 days are warning signs indicating that you should see a caregiver for a follow-up visit as soon as possible. Initially, a hairline fracture (break in bone) may not be evident on X-rays. Persistent pain and swelling indicate that further evaluation, non-weight bearing or use of the joint (use of crutches or slings as instructed), or further X-rays are indicated. X-rays may sometimes not show a small fracture until a week or 10 days later. Make a follow-up appointment with your own caregiver or one to whom we have referred you. A radiologist (specialist in reading X-rays) may read your X-rays. Make sure you know how you are to  obtain your X-ray results. Do not assume everything is normal if you do not hear from [place]. SEEK MEDICAL CARE IF: Bruising, swelling, or pain increases. SEEK IMMEDIATE MEDICAL CARE IF:   Your fingers or toes are numb or blue.  The pain is not responding to medications and continues to stay the same or get worse.  The pain in your joint becomes severe.  You  develop a fever over 102 F (38.9 C).  It becomes impossible to move or use the joint. MAKE SURE YOU:   Understand these instructions.  Will watch your condition.  Will get help right away if you are not doing well or get worse. Document Released: 03/23/2005 Document Revised: 06/15/2011 Document Reviewed: 11/09/2007 [facility] Patient Information 2015 [place], [place]. This information is not intended to replace advice given to you by your health care provider. Make sure you discuss any questions you have with your health care provider.    Emergency Department Resource Guide 1) Find a Doctor and Pay Out of Pocket Although you won't have to find out who is covered by your insurance plan, it is a good idea to ask around and get recommendations. You will then need to call the office and see if the doctor you have chosen will accept you as a new patient and what types of options they offer for patients who are self-pay. Some doctors offer discounts or will set up payment plans for their patients who do not have insurance, but you will need to ask so you aren't surprised when you get to your appointment.  2) Contact Your Local Health Department Not all health departments have doctors that can see patients for sick visits, but many do, so it is worth a call to see if yours does. If you don't know where your local health department is, you can check in your phone book. The CDC also has a tool to help you locate your state's health department, and many state websites also have listings of all of their local health departments.  3) Find a [facility] If your illness is not likely to be very severe or complicated, you may want to try a walk in clinic. These are popping up all over the country in pharmacies, drugstores, and shopping centers. They're usually staffed by nurse practitioners or physician assistants that have been trained to treat common illnesses and complaints. They're usually fairly quick  and inexpensive. However, if you have serious medical issues or chronic medical problems, these are probably not your best option.  No Primary Care Doctor: - Call Health Connect at  [phone] - they can help you locate a primary care doctor that  accepts your insurance, provides certain services, etc. - Physician Referral Service- [phone]  Chronic Pain Problems: Organization         Address  Phone   Notes  [facility]  [phone] Patients need to be referred by their primary care doctor.   Medication Assistance: Organization         Address  Phone   Notes  [facility] [address] [place], [place] [address] [phone] --Must be a resident of [facility] -- Must have NO insurance coverage whatsoever (no Medicaid/ Medicare, etc.) -- The pt. MUST have a primary care doctor that directs their care regularly and follows them in the community   Med[facility]ist  [phone]   [employer]  [phone]    Agencies that provide  inexpensive medical care: Organization         Address  Phone   Notes  [place]  [phone]   [facility]    [phone]   [facility] [place], [place] [address] [phone]   [place] [address], [place] [phone]   Planned Parenthood    [phone]   [facility]    [phone]   [place] [address], [place] Phone:  [phone], Fax:  [phone] Hours of Operation:  9 am - 6 pm, M-F.  Also accepts Medicaid/Medicare and self-pay.  [facility] for [address], [address], [place] Phone: [phone], Fax: [phone]. Hours of Operation:  8:30 am - 5:30 pm, M-F.  Also accepts Medicaid and self-pay.  [facility] [address] [place] Phone: [phone]    [place], [place], [place] [phone], Ext. [phone] Mondays & Thursdays: 7-9 AM.  First 15 patients are seen on a first come, first serve basis.    [place] Providers:  Organization         Address  Phone   Notes  [facility] [address], [place] [phone] Also accepts self-pay patients.  [facility] [address], [place]  [phone]   [place], [address], [place] [phone]   [facility] [address] [place] [phone]   Lucianne Lei [address], [place]   [phone] Only accepts [place] Access [place] patients after they have their name applied to their card.   Self-Pay (no insurance) in [facility]:  Organization         Address  Phone   Notes  Sickle Cell Patients, [facility] [place] [phone]   [facility] [place] [phone]   [facility] [place]  [place], [place], [place] [phone]   [facility]/Dr. Osei-Bonsu  [address], [place] or [address], [place] [phone] Phone number for both [place] and [place] locations is the same.  [facility] [address], [place] [phone]   [facility] [address], [place] or [address] [phone]   [facility] [address], [place] [phone], phone; [phone], fax Sees patients 1st and 3rd Saturday of every month.  Must not qualify for public or private insurance (i.e. Medicaid, Medicare, [place] Health Choice, Veterans' Benefits)  Household income should be no more than 200% of the poverty level The clinic cannot treat you if you are  pregnant or think you are pregnant  Sexually transmitted diseases are not treated at the clinic.    Dental Care: Organization         Address  Phone  Notes  [facility] Department of [facility] [place] [phone] Accepts children up to age 65 who are enrolled in [place] or [place]; pregnant women with a Medicaid card; and children who have applied for Medicaid or [place] Health Choice,  but were declined, whose parents can pay a reduced fee at time of service.  [facility] Department of [facility]  [address], [place] [phone] Accepts children up to age 62 who are enrolled in [place] or [place]; pregnant women with a Medicaid card; and children who have applied for Medicaid or [place] Health Choice, but were declined, whose parents can pay a reduced fee at time of service.  [place] Adult [facility] Access PROGRAM  [place] [phone] Patients are seen by appointment only. Walk-ins are not accepted. [place] will see patients 58 years of age and older. Monday - Tuesday (8am-5pm) Most Wednesdays (8:30-5pm) $30 per visit, cash only  [facility] Adult Dental Access PROGRAM  [address], [facility] [phone] Patients are seen by appointment only. Walk-ins are not accepted. [place] will see patients 9 years of age and older. One Wednesday Evening (Monthly: Volunteer Based).  $30 per visit, cash only  [place]  [phone] for adults; Children under age 55, call Graduate Pediatric Dentistry at [phone]. Children aged 84-14, please call [phone] to request a pediatric application.  Dental services are provided in all areas of dental care including fillings, crowns and bridges, complete and partial dentures, implants, gum treatment, root canals, and extractions. Preventive care is also provided. Treatment is provided to  both adults and children. Patients are selected via a lottery and there is often a waiting list.   [facility] [address] [place]  [phone] [email or web address]   [facility] [address] [place], [place] [phone], Ext. [phone] Second and Fourth Thursday of each month, opens at 6:30 AM; Clinic ends at 9 AM.  Patients are seen on a first-come first-served basis, and a limited number are seen during each clinic.   [facility]  [address] Hillard Danker [place], [place] [phone]   Eligibility Requirements You must have lived in [place], [place], or [place] counties for at least the last three months.   You cannot be eligible for state or federal sponsored [employer], including [employer], [place], or [employer].   You generally cannot be eligible for healthcare insurance through your employer.    How to apply: Eligibility screenings are held every Tuesday and Wednesday afternoon from 1:00 pm until 4:00 pm. You do not need an appointment for the interview!  [facility] [address] [place], [place]   [place]  [place] Department  [place]  [phone]    Behavioral Health Resources in the Community: Intensive Outpatient Programs Organization         Address  Phone  Notes  [place] [facility]. [address] [place], [place] [phone]   [facility] Outpatient [address] [place], [place]   ADS: Alcohol [facility] Drug S[facility] [address] [place], [place]   [place] [address],  [place], [place] or [phone]   Substance Abuse Resources Organization         Address  Phone  Notes  Alcohol and Drug Services  [phone]   [facility]  [phone]   [facility] [place]   [phone]   Chinita Pester  [phone]   Residential & Outpatient Substance Abuse Program  [phone]   Psychological  Special educational needs teacher         Address  Phone  Notes  [facility] [place]  [place]  [phone]   [place] [address], [place] or [phone]    Mobile Crisis Teams Organization         Address  Phone  Notes  [facility], Mobile Crisis Care Unit  [phone]   [facility]  [address] [place], [place]   [place] [address] [place] [phone]    Self-Help/Support Groups Organization         Address  Phone             Notes  [place] [place] - variety of support groups  [place] Call for more information  Narcotics Anonymous (NA), [facility] [address], [employer] [place]  2 meetings at this location   Special educational needs teacher         Address  Phone  Notes  [facility] [place],    [place]  [phone]   [facility]  [address] [place] 784696, [place], [place]   [place], [place] [phone] Admissions: 8am-3pm M-F  Incentives Substance [place] [address]    [place], [place] [phone]   [facility] [address] [place], [place], [place]   [facility] [address],  [place], [place]   [facility] - Intensive Outpatient [address] Kristeen Mans [address], [place], [place]   [facility] [place].,  [place], [place] [phone] or [phone]   [facility] (RT[facility]) [address] [place], [place] Accepts Medicaid  [facility] [place] [address],  [place] [phone] Substance Abuse/Addiction Treatment   [facility] Organization         Address  Phone  Notes  [facility]  [phone]   Domenic Schwab, PhD [address] Arlis Porta [place], [place]   [phone] or [phone]   [facility] [place], [place] [phone]   [facility] [address], [place], [place] [phone] Insurance/Medicaid/sponsorship through [facility] and Families [address]                                    [place], [place] [phone] [place] [address], [place] [phone]    Dr. Adele Schilder  [phone]   [facility] Dept. 1) [address] [place] 2) [place] 3)  [place] [address], [place] [phone]  [phone]   [place] [phone] or [phone] (After Hours)

## 2014-12-18 ENCOUNTER — Other Ambulatory Visit: Payer: Self-pay | Admitting: Family Medicine

## 2014-12-18 DIAGNOSIS — Z1231 Encounter for screening mammogram for malignant neoplasm of breast: Secondary | ICD-10-CM

## 2014-12-24 ENCOUNTER — Ambulatory Visit: Payer: No Typology Code available for payment source

## 2014-12-25 ENCOUNTER — Ambulatory Visit
Admission: RE | Admit: 2014-12-25 | Discharge: 2014-12-25 | Disposition: A | Discharge status: Home / Self Care | Payer: No Typology Code available for payment source | Source: Ambulatory Visit | Attending: Family Medicine | Admitting: Family Medicine

## 2014-12-25 DIAGNOSIS — Z1231 Encounter for screening mammogram for malignant neoplasm of breast: Secondary | ICD-10-CM

## 2014-12-26 ENCOUNTER — Ambulatory Visit: Payer: No Typology Code available for payment source

## 2015-03-20 ENCOUNTER — Ambulatory Visit (INDEPENDENT_AMBULATORY_CARE_PROVIDER_SITE_OTHER): Payer: No Typology Code available for payment source | Admitting: Physician Assistant

## 2015-03-20 VITALS — BP 120/80 | HR 74 | Temp 98.4°F | Resp 16 | Ht 68.0 in | Wt 154.2 lb

## 2015-03-20 DIAGNOSIS — L84 Corns and callosities: Secondary | ICD-10-CM | POA: Diagnosis not present

## 2015-03-20 DIAGNOSIS — M25572 Pain in left ankle and joints of left foot: Secondary | ICD-10-CM

## 2015-03-20 MED ORDER — MELOXICAM 15 MG PO TABS: 15.0000 mg | ORAL_TABLET | Freq: Every day | ORAL | Status: DC

## 2015-03-20 NOTE — Progress Notes (Signed)
   Patient ID: Tasha Nguyen, female    DOB: January 29, 1956, 59 y.o.   MRN: [id number]  PCP: No primary care provider on file.  Chief Complaint  Patient presents with  . Foot Injury    left foot plantar wart    Subjective:  HPI Presents for evaluation of pain of the LEFT 5th toe and bottom of the LEFT foot. She was seen for the lesion on the toe at [facility] 10[facility], and had a callous pared and cauterized successively down to healthy skin . Reports it resolved [facility]. LEFT 5th toe lesion recurred about 3 weeks ago. The new lesion, on the bottom of the foot, has been present 4 weeks. Progressively worsening. Hurts to stand. Has to wear heavy work boots for her job, where she stands her whole shift. Soaking her foot in Dr. Felicie Morn foot powder and Epsom salts. Using an OTC electric callous shaver.  Review of Systems As above.    Patient Active Problem List   Diagnosis Date Noted  . HYPERLIPIDEMIA 08/16/2008  . HYPERTENSION 08/16/2008  . KNEE PAIN, RIGHT 08/16/2008    Allergies  Allergen Reactions  . Oxycodone Other (See Comments)    Hallucinations    Prior to Admission medications   Not on File     Past Medical, Surgical Family and Social History reviewed and updated.        Objective:  Physical Exam  Constitutional: She is oriented to person, place, and time. She appears well-developed and well-nourished. She is active and cooperative. No distress.  BP 120/80 mmHg  Pulse 74  Temp(Src) 98.4 F (36.9 C) (Oral)  Resp 16  Ht 5\' 8"  (1.727 m)  Wt 154 lb 3.2 oz (69.945 kg)  BMI 23.45 kg/m2  SpO2 97%   Eyes: Conjunctivae are normal.  Pulmonary/Chest: Effort normal.  Neurological: She is alert and oriented to person, place, and time.  Skin: Skin is warm and dry.  Thickened skin on the outer LEFT 5th toe, consistent with corn. Large callous on the plantar surface of the distal foot, head of the 2nd and 3rd metatarsals (she notes it is about  1/2 the original size). Both areas are tender on palpation. No surrounding erythema, edema or induration. No drainage.  Psychiatric: She has a normal mood and affect. Her speech is normal and behavior is normal.           Assessment & Plan:  1. Pain in joint, ankle and foot, left Due to #2 - meloxicam (MOBIC) 15 MG tablet; Take 1 tablet (15 mg total) by mouth daily.  Dispense: 30 tablet; Refill: 1 - Ambulatory referral to Podiatry  2. Pre-ulcerative corn or callous Continue soaks and paring the hypertrophic skin until she can see podiatry. Continue callous pads to cushion and reduce further friction. - Ambulatory referral to Podiatry   Fara Chute, PA-C Physician Assistant-Certified [facility]

## 2015-03-20 NOTE — Patient Instructions (Signed)
Continue soaking the foot and shaving the calloused skin. Continue using the pads to cushion the areas. The podiatry office will contact you directly to schedule with the foot specialist.  Take the meloxicam with food. Do not take ibuprofen or naproxen with it. You CAN take acetaminophen (Tylenol) with it if needed.

## 2015-05-20 ENCOUNTER — Encounter (HOSPITAL_COMMUNITY): Payer: Self-pay | Admitting: Emergency Medicine

## 2015-05-20 ENCOUNTER — Emergency Department (HOSPITAL_COMMUNITY)
Admission: EM | Admit: 2015-05-20 | Discharge: 2015-05-20 | Disposition: A | Discharge status: Home / Self Care | Payer: No Typology Code available for payment source | Attending: Emergency Medicine | Admitting: Emergency Medicine

## 2015-05-20 ENCOUNTER — Ambulatory Visit: Payer: Managed Care, Other (non HMO)

## 2015-05-20 DIAGNOSIS — Z791 Long term (current) use of non-steroidal anti-inflammatories (NSAID): Secondary | ICD-10-CM | POA: Insufficient documentation

## 2015-05-20 DIAGNOSIS — N390 Urinary tract infection, site not specified: Secondary | ICD-10-CM | POA: Insufficient documentation

## 2015-05-20 DIAGNOSIS — R319 Hematuria, unspecified: Secondary | ICD-10-CM

## 2015-05-20 DIAGNOSIS — R110 Nausea: Secondary | ICD-10-CM | POA: Insufficient documentation

## 2015-05-20 DIAGNOSIS — R197 Diarrhea, unspecified: Secondary | ICD-10-CM | POA: Insufficient documentation

## 2015-05-20 LAB — URINALYSIS, ROUTINE W REFLEX MICROSCOPIC
Bilirubin Urine: NEGATIVE
GLUCOSE, UA: NEGATIVE mg/dL
Ketones, ur: NEGATIVE mg/dL
Nitrite: NEGATIVE
PH: 5.5 (ref 5.0–8.0)
Protein, ur: NEGATIVE mg/dL
Specific Gravity, Urine: 1.009 (ref 1.005–1.030)

## 2015-05-20 LAB — URINE MICROSCOPIC-ADD ON

## 2015-05-20 MED ORDER — CIPROFLOXACIN HCL 500 MG PO TABS
500.0000 mg | ORAL_TABLET | Freq: Once | ORAL | Status: AC
  Administered 2015-05-20: 500 mg via ORAL
  Filled 2015-05-20: qty 1

## 2015-05-20 MED ORDER — CIPROFLOXACIN HCL 500 MG PO TABS: 500.0000 mg | ORAL_TABLET | Freq: Two times a day (BID) | ORAL | Status: DC

## 2015-05-20 NOTE — Discharge Instructions (Signed)
Urinary Tract Infection °Urinary tract infections (UTIs) can develop anywhere along your urinary tract. Your urinary tract is your body's drainage system for removing wastes and extra water. Your urinary tract includes two kidneys, two ureters, a bladder, and a urethra. Your kidneys are a pair of bean-shaped organs. Each kidney is about the size of your fist. They are located below your ribs, one on each side of your spine. °CAUSES °Infections are caused by microbes, which are microscopic organisms, including fungi, viruses, and bacteria. These organisms are so small that they can only be seen through a microscope. Bacteria are the microbes that most commonly cause UTIs. °SYMPTOMS  °Symptoms of UTIs may vary by age and gender of the patient and by the location of the infection. Symptoms in young women typically include a frequent and intense urge to urinate and a painful, burning feeling in the bladder or urethra during urination. Older women and men are more likely to be tired, shaky, and weak and have muscle aches and abdominal pain. A fever may mean the infection is in your kidneys. Other symptoms of a kidney infection include pain in your back or sides below the ribs, nausea, and vomiting. °DIAGNOSIS °To diagnose a UTI, your caregiver will ask you about your symptoms. Your caregiver will also ask you to provide a urine sample. The urine sample will be tested for bacteria and white blood cells. White blood cells are made by your body to help fight infection. °TREATMENT  °Typically, UTIs can be treated with medication. Because most UTIs are caused by a bacterial infection, they usually can be treated with the use of antibiotics. The choice of antibiotic and length of treatment depend on your symptoms and the type of bacteria causing your infection. °HOME CARE INSTRUCTIONS °· If you were prescribed antibiotics, take them exactly as your caregiver instructs you. Finish the medication even if you feel better after  you have only taken some of the medication. °· Drink enough water and fluids to keep your urine clear or pale yellow. °· Avoid caffeine, tea, and carbonated beverages. They tend to irritate your bladder. °· Empty your bladder often. Avoid holding urine for long periods of time. °· Empty your bladder before and after sexual intercourse. °· After a bowel movement, women should cleanse from front to back. Use each tissue only once. °SEEK MEDICAL CARE IF:  °· You have back pain. °· You develop a fever. °· Your symptoms do not begin to resolve within 3 days. °SEEK IMMEDIATE MEDICAL CARE IF:  °· You have severe back pain or lower abdominal pain. °· You develop chills. °· You have nausea or vomiting. °· You have continued burning or discomfort with urination. °MAKE SURE YOU:  °· Understand these instructions. °· Will watch your condition. °· Will get help right away if you are not doing well or get worse. °  °This information is not intended to replace advice given to you by your health care provider. Make sure you discuss any questions you have with your health care provider. °  °Document Released: 12/31/2004 Document Revised: 12/12/2014 Document Reviewed: 05/01/2011 °Elsevier Interactive Patient Education ©2016 [employer] ° °Hematuria, Adult °Hematuria is blood in your urine. It can be caused by a bladder infection, kidney infection, prostate infection, kidney stone, or cancer of your urinary tract. Infections can usually be treated with medicine, and a kidney stone usually will pass through your urine. If neither of these is the cause of your hematuria, further workup to find out   the reason may be needed. °It is very important that you tell your health care provider about any blood you see in your urine, even if the blood stops without treatment or happens without causing pain. Blood in your urine that happens and then stops and then happens again can be a symptom of a very serious condition. Also, pain is not a  symptom in the initial stages of many urinary cancers. °HOME CARE INSTRUCTIONS  °· Drink lots of fluid, 3-4 quarts a day. If you have been diagnosed with an infection, cranberry juice is especially recommended, in addition to large amounts of water. °· Avoid caffeine, tea, and carbonated beverages because they tend to irritate the bladder. °· Avoid alcohol because it may irritate the prostate. °· Take all medicines as directed by your health care provider. °· If you were prescribed an antibiotic medicine, finish it all even if you start to feel better. °· If you have been diagnosed with a kidney stone, follow your health care provider's instructions regarding straining your urine to catch the stone. °· Empty your bladder often. Avoid holding urine for long periods of time. °· After a bowel movement, women should cleanse front to back. Use each tissue only once. °· Empty your bladder before and after sexual intercourse if you are a female. °SEEK MEDICAL CARE IF: °· You develop back pain. °· You have a fever. °· You have a feeling of sickness in your stomach (nausea) or vomiting. °· Your symptoms are not better in 3 days. Return sooner if you are getting worse. °SEEK IMMEDIATE MEDICAL CARE IF:  °· You develop severe vomiting and are unable to keep the medicine down. °· You develop severe back or abdominal pain despite taking your medicines. °· You begin passing a large amount of blood or clots in your urine. °· You feel extremely weak or faint, or you pass out. °MAKE SURE YOU:  °· Understand these instructions. °· Will watch your condition. °· Will get help right away if you are not doing well or get worse. °  °This information is not intended to replace advice given to you by your health care provider. Make sure you discuss any questions you have with your health care provider. °  °Document Released: 03/23/2005 Document Revised: 04/13/2014 Document Reviewed: 11/21/2012 °Elsevier Interactive Patient Education ©2016  [employer]vier Inc. ° °

## 2015-05-20 NOTE — ED Provider Notes (Signed)
CSN: [id number]     Arrival date & time 05/20/15  1214 History  By signing my name below, I, Tasha Nguyen, attest that this documentation has been prepared under the direction and in the presence of Delos Haring, PA-C Electronically Signed: [name], ED Scribe. 05/20/2015. 8:40 PM.   Chief Complaint  Patient presents with  . Hematuria      The history is provided by the patient. No language interpreter was used.    HPI Comments: Tasha Nguyen is a 60 y.o. female  who presents to the Emergency Department complaining of hematuria onset this morning. She notes that she did an epsom salt soak prior to the onset of her symptoms for her muscle aches. She states that she said blood mixed in with her urine intially and it has now resulted in light pink urine. She states that she is having associated symptoms of abdominal pressure when urinating, diarrhea, and frequency. She has not tried any medications for the relief for her symptoms. She denies fever, vomiting, nausea, flank pain, and any other symptoms. Pt PCP is at [facility]. Pt is allergic to oxycodone. Pt denies any medical issues besides arthritis at this time.   History reviewed. No pertinent past medical history. History reviewed. No pertinent past surgical history. Family History  Problem Relation Age of Onset  . CAD Father   . CAD Sister   . CAD Other   . Diabetes Other    Social History  Substance Use Topics  . Smoking status: Never Smoker   . Smokeless tobacco: None  . Alcohol Use: Yes     Comment: weekends   OB History    No data available     Review of Systems  Constitutional: Negative for fever.  Gastrointestinal: Positive for abdominal pain and diarrhea. Negative for nausea and vomiting.  Genitourinary: Positive for frequency and hematuria. Negative for flank pain.  All other systems reviewed and are negative.     Allergies  Oxycodone  Home Medications   Prior to Admission  medications   Medication Sig Start Date End Date Taking? Authorizing Provider  meloxicam (MOBIC) 15 MG tablet Take 1 tablet (15 mg total) by mouth daily. 03/20/15   Chelle Jeffery, PA-C   BP 142/100 mmHg  Pulse 68  Temp(Src) 98.1 F (36.7 C) (Oral)  Resp 18  SpO2 99% Physical Exam  Constitutional: She is oriented to person, place, and time. She appears well-developed and well-nourished. No distress.  HENT:  Head: Normocephalic and atraumatic.  Eyes: EOM are normal.  Neck: Neck supple.  Cardiovascular: Normal rate, regular rhythm and normal heart sounds.  Exam reveals no gallop and no friction rub.   No murmur heard. Pulmonary/Chest: Effort normal and breath sounds normal. No respiratory distress. She has no wheezes. She has no rales.  Abdominal: Soft. Bowel sounds are normal. She exhibits no distension. There is tenderness (mild) in the suprapubic area. There is no CVA tenderness.  Musculoskeletal: Normal range of motion. She exhibits no tenderness.  Neurological: She is alert and oriented to person, place, and time.  Skin: Skin is warm and dry.  Psychiatric: She has a normal mood and affect. Her behavior is normal.  Nursing note and vitals reviewed.   ED Course  Procedures (including critical care time) DIAGNOSTIC STUDIES: Oxygen Saturation is 99% on RA, nl by my interpretation.    COORDINATION OF CARE: 8:38 PM Discussed treatment plan with pt at bedside which includes UA, ciprofloxacin Rx, follow up with PCP,  and pt agreed to plan.   Labs Review Labs Reviewed  URINALYSIS, ROUTINE W REFLEX MICROSCOPIC (NOT AT [facility]) - Abnormal; Notable for the following:    APPearance TURBID (*)    Hgb urine dipstick LARGE (*)    Leukocytes, UA LARGE (*)    All other components within normal limits  URINE MICROSCOPIC-ADD ON - Abnormal; Notable for the following:    Squamous Epithelial / LPF 0-5 (*)    Bacteria, UA RARE (*)    All other components within normal limits  URINE CULTURE     Imaging Review No results found. I have personally reviewed and evaluated these lab results as part of my medical decision-making.   EKG Interpretation None      MDM   Final diagnoses:  UTI (lower urinary tract infection)  Hematuria   Medications  ciprofloxacin (CIPRO) tablet 500 mg (500 mg Oral Given 05/20/15 2045)    Pt diagnosed with a UTI. Pt is afebrile, tachycardia, hypotension, or other signs of serious infection.  Pt to be dc home with antibiotics and instructions to follow up with PCP in 7-10 days if symptoms persist. Discussed return precautions. Pt appears safe for discharge. I personally performed the services described in this documentation, which was scribed in my presence. The recorded information has been reviewed and is accurate.    Delos Haring, PA-C 05/24/15 1112  Davonna Belling, MD 05/24/15 2209

## 2015-05-20 NOTE — ED Notes (Signed)
Pt reports blood in urine this am. Reports pressure in her stomach when she urinates and increase frequency. Denies n/v/d. Denies pain at this time and fever at home.

## 2015-05-22 LAB — URINE CULTURE

## 2016-06-18 DIAGNOSIS — G5762 Lesion of plantar nerve, left lower limb: Secondary | ICD-10-CM | POA: Insufficient documentation

## 2016-11-13 ENCOUNTER — Emergency Department (HOSPITAL_COMMUNITY): Payer: No Typology Code available for payment source

## 2016-11-13 ENCOUNTER — Encounter (HOSPITAL_COMMUNITY): Payer: Self-pay

## 2016-11-13 ENCOUNTER — Emergency Department (HOSPITAL_COMMUNITY)
Admission: EM | Admit: 2016-11-13 | Discharge: 2016-11-13 | Disposition: A | Discharge status: Home / Self Care | Payer: No Typology Code available for payment source | Attending: Emergency Medicine | Admitting: Emergency Medicine

## 2016-11-13 DIAGNOSIS — W2209XA Striking against other stationary object, initial encounter: Secondary | ICD-10-CM | POA: Diagnosis not present

## 2016-11-13 DIAGNOSIS — Y929 Unspecified place or not applicable: Secondary | ICD-10-CM | POA: Diagnosis not present

## 2016-11-13 DIAGNOSIS — Y939 Activity, unspecified: Secondary | ICD-10-CM | POA: Diagnosis not present

## 2016-11-13 DIAGNOSIS — Y990 Civilian activity done for income or pay: Secondary | ICD-10-CM | POA: Insufficient documentation

## 2016-11-13 DIAGNOSIS — Z79899 Other long term (current) drug therapy: Secondary | ICD-10-CM | POA: Diagnosis not present

## 2016-11-13 DIAGNOSIS — S63621A Sprain of interphalangeal joint of right thumb, initial encounter: Secondary | ICD-10-CM | POA: Diagnosis not present

## 2016-11-13 DIAGNOSIS — M79641 Pain in right hand: Secondary | ICD-10-CM | POA: Diagnosis present

## 2016-11-13 NOTE — ED Triage Notes (Signed)
Pt jammed rt thumb yesterday. Today pain in thumb through hand and up arm

## 2016-11-13 NOTE — Discharge Instructions (Signed)
Please read attached information. If you experience any new or worsening signs or symptoms please return to the emergency room for evaluation. Please follow-up with your primary care provider or specialist as discussed.  °

## 2016-11-13 NOTE — ED Notes (Signed)
Discharge instructions reviewed with patient. Patient verbalizes understanding. VSS.   

## 2016-11-13 NOTE — ED Provider Notes (Signed)
[facility] DEPT Provider Note   CSN: [id number] Arrival date & time: 11/13/16  1716     History   Chief Complaint Chief Complaint  Patient presents with  . Hand Pain    HPI Tasha Nguyen is a 61 y.o. female.  HPI   61 year old female presents today with complaints of right thumb and hand pain.  Patient notes that she works in a [employer], clinical (histocompatibility and immunogenetics) her right thumb into a pan last night.  She notes swelling to the thumb with pain in the proximal hand with radiation up into her elbow.  She denies any pain or swelling to the wrist forearm or elbow other than the radiation of symptoms.  No medications prior to arrival.  No history of the same.  History reviewed. No pertinent past medical history.  Patient Active Problem List   Diagnosis Date Noted  . HYPERLIPIDEMIA 08/16/2008  . HYPERTENSION 08/16/2008  . KNEE PAIN, RIGHT 08/16/2008    History reviewed. No pertinent surgical history.  OB History    No data available       Home Medications    Prior to Admission medications   Medication Sig Start Date End Date Taking? Authorizing Provider  ciprofloxacin (CIPRO) 500 MG tablet Take 1 tablet (500 mg total) by mouth 2 (two) times daily. 05/20/15   Delos Haring, PA-C  meloxicam (MOBIC) 15 MG tablet Take 1 tablet (15 mg total) by mouth daily. 03/20/15   Harrison Mons, PA-C    Family History Family History  Problem Relation Age of Onset  . CAD Father   . CAD Sister   . CAD Other   . Diabetes Other     Social History Social History  Substance Use Topics  . Smoking status: Never Smoker  . Smokeless tobacco: Never Used  . Alcohol use Yes     Comment: weekends     Allergies   Oxycodone   Review of Systems Review of Systems  All other systems reviewed and are negative.    Physical Exam Updated Vital Signs BP (!) 135/96 (BP Location: Left Arm)   Pulse 75   Temp 99.7 F (37.6 C) (Oral)   Resp 18   Ht 5\' 9"  (1.753 m)   Wt 61.2 kg (135 lb)   SpO2  100%   BMI 19.94 kg/m   Physical Exam  Constitutional: She is oriented to person, place, and time. She appears well-developed and well-nourished.  HENT:  Head: Normocephalic and atraumatic.  Eyes: Pupils are equal, round, and reactive to light. Conjunctivae are normal. Right eye exhibits no discharge. Left eye exhibits no discharge. No scleral icterus.  Neck: Normal range of motion. No JVD present. No tracheal deviation present.  Pulmonary/Chest: Effort normal. No stridor.  Musculoskeletal:  Tenderness to palpation of the right distal and middle phalanx of the right thumb, no open wounds, no subungual hematoma,  Neurological: She is alert and oriented to person, place, and time. Coordination normal.  Psychiatric: She has a normal mood and affect. Her behavior is normal. Judgment and thought content normal.  Nursing note and vitals reviewed.    ED Treatments / Results  Labs (all labs ordered are listed, but only abnormal results are displayed) Labs Reviewed - No data to display  EKG  EKG Interpretation None       Radiology Dg Finger Thumb Right  Result Date: 11/13/2016 CLINICAL DATA:  Thumb injury yesterday. Patient jammed the thumb and has pain today the hand and arm. EXAM: RIGHT THUMB 2+V  COMPARISON:  None. FINDINGS: There is no evidence of fracture or dislocation. Osteoarthritic joint space narrowing of the first [facility] articulation. There is no evidence of arthropathy or other focal bone abnormality. Soft tissues are unremarkable apart from a punctate capsular calcification at the first MCP. IMPRESSION: Negative for acute fracture or dislocations of the right thumb. 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 Electronically Signed   By: Ashley Royalty M.D.   On: 11/13/2016 18:46    Procedures Procedures (including critical care time)  Medications Ordered in ED Medications - No data to display   Initial Impression / Assessment and Plan / ED Course  I have reviewed the triage vital  signs and the nursing notes.  Pertinent labs & imaging results that were available during my care of the patient were reviewed by me and considered in my medical decision making (see chart for details).     61 year old female presents today with hand pain secondary to jamming her thumb.  She has no significant laxity or severe sprain.  She has no open wounds or broken bones.  Splinted with symptomatic care instructions and return precautions.  She verbalized understanding and agreement to today's plan.  Final Clinical Impressions(s) / ED Diagnoses   Final diagnoses:  Sprain of interphalangeal joint of right thumb, initial encounter    New Prescriptions Discharge Medication List as of 11/13/2016  8:05 PM       Okey Regal, PA-C 11/13/16 2314    Carmin Muskrat, MD 11/14/16 [phone]

## 2018-01-28 ENCOUNTER — Emergency Department (HOSPITAL_COMMUNITY)
Admission: EM | Admit: 2018-01-28 | Discharge: 2018-01-28 | Disposition: A | Discharge status: Home / Self Care | Payer: No Typology Code available for payment source | Attending: Emergency Medicine | Admitting: Emergency Medicine

## 2018-01-28 ENCOUNTER — Other Ambulatory Visit: Payer: Self-pay

## 2018-01-28 ENCOUNTER — Emergency Department (HOSPITAL_COMMUNITY): Payer: No Typology Code available for payment source

## 2018-01-28 ENCOUNTER — Encounter (HOSPITAL_COMMUNITY): Payer: Self-pay | Admitting: Emergency Medicine

## 2018-01-28 DIAGNOSIS — R110 Nausea: Secondary | ICD-10-CM | POA: Diagnosis not present

## 2018-01-28 DIAGNOSIS — I10 Essential (primary) hypertension: Secondary | ICD-10-CM | POA: Insufficient documentation

## 2018-01-28 DIAGNOSIS — R1084 Generalized abdominal pain: Secondary | ICD-10-CM | POA: Insufficient documentation

## 2018-01-28 DIAGNOSIS — Z79899 Other long term (current) drug therapy: Secondary | ICD-10-CM | POA: Insufficient documentation

## 2018-01-28 DIAGNOSIS — R197 Diarrhea, unspecified: Secondary | ICD-10-CM | POA: Insufficient documentation

## 2018-01-28 LAB — CBC WITH DIFFERENTIAL/PLATELET
ABS IMMATURE GRANULOCYTES: 0.03 10*3/uL (ref 0.00–0.07)
BASOS ABS: 0 10*3/uL (ref 0.0–0.1)
Basophils Relative: 0 %
EOS ABS: 0 10*3/uL (ref 0.0–0.5)
Eosinophils Relative: 0 %
HEMATOCRIT: 46.3 % — AB (ref 36.0–46.0)
Hemoglobin: 14.9 g/dL (ref 12.0–15.0)
Immature Granulocytes: 1 %
LYMPHS ABS: 0.9 10*3/uL (ref 0.7–4.0)
Lymphocytes Relative: 15 %
MCH: 31.3 pg (ref 26.0–34.0)
MCHC: 32.2 g/dL (ref 30.0–36.0)
MCV: 97.3 fL (ref 80.0–100.0)
Monocytes Absolute: 0.3 10*3/uL (ref 0.1–1.0)
Monocytes Relative: 5 %
NEUTROS ABS: 4.6 10*3/uL (ref 1.7–7.7)
NEUTROS PCT: 79 %
NRBC: 0 % (ref 0.0–0.2)
Platelets: 343 10*3/uL (ref 150–400)
RBC: 4.76 MIL/uL (ref 3.87–5.11)
RDW: 13.7 % (ref 11.5–15.5)
WBC: 5.8 10*3/uL (ref 4.0–10.5)

## 2018-01-28 LAB — COMPREHENSIVE METABOLIC PANEL
ALBUMIN: 4.1 g/dL (ref 3.5–5.0)
ALT: 15 U/L (ref 0–44)
AST: 20 U/L (ref 15–41)
Alkaline Phosphatase: 64 U/L (ref 38–126)
Anion gap: 10 (ref 5–15)
BILIRUBIN TOTAL: 0.7 mg/dL (ref 0.3–1.2)
BUN: 10 mg/dL (ref 6–20)
CHLORIDE: 108 mmol/L (ref 98–111)
CO2: 22 mmol/L (ref 22–32)
CREATININE: 0.54 mg/dL (ref 0.44–1.00)
Calcium: 9.1 mg/dL (ref 8.9–10.3)
GFR calc Af Amer: >60 mL/min (ref >60)
GFR calc non Af Amer: >60 mL/min (ref >60)
Glucose, Bld: 94 mg/dL (ref 70–99)
POTASSIUM: 3.4 mmol/L — AB (ref 3.5–5.1)
Sodium: 140 mmol/L (ref 135–145)
TOTAL PROTEIN: 7.4 g/dL (ref 6.5–8.1)

## 2018-01-28 LAB — LIPASE, BLOOD: Lipase: 22 U/L (ref 11–51)

## 2018-01-28 MED ORDER — FAMOTIDINE IN NACL 20-0.9 MG/50ML-% IV SOLN
20.0000 mg | Freq: Once | INTRAVENOUS | Status: AC
  Administered 2018-01-28: 20 mg via INTRAVENOUS
  Filled 2018-01-28: qty 50

## 2018-01-28 MED ORDER — ONDANSETRON HCL 4 MG/2ML IJ SOLN
4.0000 mg | Freq: Once | INTRAMUSCULAR | Status: AC
  Administered 2018-01-28: 4 mg via INTRAVENOUS
  Filled 2018-01-28: qty 2

## 2018-01-28 MED ORDER — DICYCLOMINE HCL 10 MG/ML IM SOLN
20.0000 mg | Freq: Once | INTRAMUSCULAR | Status: AC
  Administered 2018-01-28: 20 mg via INTRAMUSCULAR
  Filled 2018-01-28: qty 2

## 2018-01-28 MED ORDER — IOPAMIDOL (ISOVUE-300) INJECTION 61%
INTRAVENOUS | Status: AC
  Filled 2018-01-28: qty 100

## 2018-01-28 MED ORDER — DICYCLOMINE HCL 20 MG PO TABS: 20.0000 mg | ORAL_TABLET | Freq: Two times a day (BID) | ORAL | 0 refills | Status: DC

## 2018-01-28 MED ORDER — SUCRALFATE 1 GM/10ML PO SUSP: 1.0000 g | Freq: Three times a day (TID) | ORAL | 0 refills | Status: DC

## 2018-01-28 MED ORDER — FAMOTIDINE 20 MG PO TABS: 20.0000 mg | ORAL_TABLET | Freq: Two times a day (BID) | ORAL | 0 refills | Status: DC

## 2018-01-28 MED ORDER — ONDANSETRON 4 MG PO TBDP: ORAL_TABLET | ORAL | 0 refills | Status: DC

## 2018-01-28 MED ORDER — SODIUM CHLORIDE 0.9 % IJ SOLN
INTRAMUSCULAR | Status: AC
  Filled 2018-01-28: qty 50

## 2018-01-28 MED ORDER — IOPAMIDOL (ISOVUE-300) INJECTION 61%
100.0000 mL | Freq: Once | INTRAVENOUS | Status: AC | PRN
  Administered 2018-01-28: 100 mL via INTRAVENOUS

## 2018-01-28 MED ORDER — SODIUM CHLORIDE 0.9 % IV BOLUS
1000.0000 mL | Freq: Once | INTRAVENOUS | Status: AC
  Administered 2018-01-28: 1000 mL via INTRAVENOUS

## 2018-01-28 NOTE — ED Triage Notes (Signed)
Pt c/o abd pains x 2 days. Diarrhea and nausea started today. Was seen at PCP today and had blood work and UA done there. Was told to come to ED imaging on abdomin.   Asked patient if we could get more blood work on her due to not being able to see the results from her PCP, patient wants to wait to be seen by provider first and results to be sent from her PCP.

## 2018-01-28 NOTE — Discharge Instructions (Signed)
Your CT scan and lab work was very reassuring today and I think your symptoms are likely due to gastroenteritis this can be caused by a virus and is usually self-limited and improved on its own you can use these medications to help improve your symptoms.  Zofran to help with nausea, Pepcid and Carafate to help with stomach inflammation and irritation, and Bentyl to help with abdominal pain.  If your symptoms are not improving please follow-up with your primary care doctor if you have worsening abdominal pain, fevers, persistent vomiting, worsening diarrhea or blood in the stools please return to the emergency department for reevaluation.

## 2018-01-28 NOTE — ED Provider Notes (Signed)
[facility] DEPT Provider Note   CSN: [id number] Arrival date & time: 01/28/18  1601     History   Chief Complaint Chief Complaint  Patient presents with  . Abdominal Pain  . Diarrhea    HPI KHRYSTAL JEANMARIE is a 62 y.o. female.  OLIVER NEUWIRTH is a 62 y.o. Female with a history of hypertension, hyperlipidemia, who presents to the emergency department for evaluation of 2 days of abdominal pain with nausea and diarrhea that started today.  She was seen by her primary care doctor and had blood work in any urinalysis done which was unremarkable but given her diffuse abdominal tenderness she was encouraged to come to the emergency department for possible imaging of her abdomen.  She reports no history of abdominal issues in the past, no prior surgeries.  She has not had any fevers or chills, has had multiple episodes of vomiting, nonbloody and several episodes of watery diarrhea, no melena or hematochezia.  She has not been on any recent antibiotics.  She denies any dysuria or urinary frequency.  No chest pain or shortness of breath.  She has not taken anything to treat her symptoms prior to arrival.     History reviewed. No pertinent past medical history.  Patient Active Problem List   Diagnosis Date Noted  . HYPERLIPIDEMIA 08/16/2008  . HYPERTENSION 08/16/2008  . KNEE PAIN, RIGHT 08/16/2008    History reviewed. No pertinent surgical history.   OB History   None      Home Medications    Prior to Admission medications   Medication Sig Start Date End Date Taking? Authorizing Provider  ciprofloxacin (CIPRO) 500 MG tablet Take 1 tablet (500 mg total) by mouth 2 (two) times daily. 05/20/15   Delos Haring, PA-C  dicyclomine (BENTYL) 20 MG tablet Take 1 tablet (20 mg total) by mouth 2 (two) times daily. 01/28/18   Jacqlyn Larsen, PA-C  famotidine (PEPCID) 20 MG tablet Take 1 tablet (20 mg total) by mouth 2 (two) times daily. 01/28/18   Jacqlyn Larsen, PA-C  meloxicam (MOBIC) 15 MG tablet Take 1 tablet (15 mg total) by mouth daily. 03/20/15   Harrison Mons, PA  ondansetron (ZOFRAN ODT) 4 MG disintegrating tablet 4mg  ODT q4 hours prn nausea/vomit 01/28/18   Jacqlyn Larsen, PA-C  sucralfate (CARAFATE) 1 GM/10ML suspension Take 10 mLs (1 g total) by mouth 4 (four) times daily -  with meals and at bedtime. 01/28/18   Jacqlyn Larsen, PA-C    Family History Family History  Problem Relation Age of Onset  . CAD Father   . CAD Sister   . CAD Other   . Diabetes Other     Social History Social History   Tobacco Use  . Smoking status: Never Smoker  . Smokeless tobacco: Never Used  Substance Use Topics  . Alcohol use: Yes    Comment: weekends  . Drug use: Yes    Types: Marijuana    Comment: occ     Allergies   Oxycodone   Review of Systems Review of Systems  Constitutional: Negative for chills and fever.  HENT: Negative.   Eyes: Negative for visual disturbance.  Respiratory: Negative for cough and shortness of breath.   Cardiovascular: Negative for chest pain.  Gastrointestinal: Positive for abdominal pain, diarrhea, nausea and vomiting. Negative for blood in stool and constipation.  Genitourinary: Negative for dysuria, flank pain and frequency.  Musculoskeletal: Negative for arthralgias, back pain and  joint swelling.  Skin: Negative for color change and rash.  Neurological: Negative for dizziness, syncope and light-headedness.     Physical Exam Updated Vital Signs BP (!) 156/116 (BP Location: Right Arm)   Pulse (!) 102   Temp 98.4 F (36.9 C) (Oral)   Resp 18   SpO2 97%   Physical Exam  Constitutional: She appears well-developed and well-nourished. She does not appear ill. No distress.  HENT:  Head: Normocephalic and atraumatic.  Mouth/Throat: Oropharynx is clear and moist.  Eyes: Right eye exhibits no discharge. Left eye exhibits no discharge.  Neck: Neck supple.  Cardiovascular: Normal rate, regular  rhythm, normal heart sounds and intact distal pulses. Exam reveals no gallop and no friction rub.  No murmur heard. Pulmonary/Chest: Effort normal and breath sounds normal. No respiratory distress.  Respirations equal and unlabored, patient able to speak in full sentences, lungs clear to auscultation bilaterally  Abdominal: Soft. Normal appearance and bowel sounds are normal. She exhibits no distension. There is generalized tenderness. There is guarding. There is no rigidity, no rebound and no CVA tenderness.  Abdomen is soft, nondistended, bowel sounds are present throughout patient has diffuse tenderness with mild guarding there is no focal rigidity or rebound tenderness, no CVA tenderness bilaterally.  Musculoskeletal: She exhibits no deformity.  Neurological: She is alert. Coordination normal.  Skin: Skin is warm and dry. Capillary refill takes less than 2 seconds. She is not diaphoretic.  Psychiatric: She has a normal mood and affect. Her behavior is normal.  Nursing note and vitals reviewed.    ED Treatments / Results  Labs (all labs ordered are listed, but only abnormal results are displayed) Labs Reviewed  CBC WITH DIFFERENTIAL/PLATELET - Abnormal; Notable for the following components:      Result Value   HCT 46.3 (*)    All other components within normal limits  COMPREHENSIVE METABOLIC PANEL - Abnormal; Notable for the following components:   Potassium 3.4 (*)    All other components within normal limits  GASTROINTESTINAL PANEL BY PCR, STOOL (REPLACES STOOL CULTURE)  LIPASE, BLOOD  URINALYSIS, ROUTINE W REFLEX MICROSCOPIC    EKG None  Radiology Ct Abdomen Pelvis W Contrast  Addendum Date: 01/28/2018   ADDENDUM REPORT: 01/28/2018 21:41 IMPRESSION: 6. There is a 6 millimeter nodule at the RIGHT lung base, associated with atelectasis within the RIGHT LOWER lobe. Non-contrast chest CT at 6-12 months is recommended. If the nodule is stable at time of repeat CT, then future CT  at 18-24 months (from today's scan) is considered optional for low-risk patients, but is recommended for high-risk patients. This recommendation follows the consensus statement: Guidelines for Management of Incidental Pulmonary Nodules Detected on CT Images: From the Fleischner Society 2017; Radiology 2017; 284:228-243. Electronically Signed   By: Nolon Nations M.D.   On: 01/28/2018 21:41   Result Date: 01/28/2018 CLINICAL DATA:  Pt c/o abd pains x 2 days. Diarrhea and nausea started today. Was seen at PCP today and had blood work and UA done there. Was told to come to ED imaging on abdomen. EXAM: CT ABDOMEN AND PELVIS WITH CONTRAST TECHNIQUE: Multidetector CT imaging of the abdomen and pelvis was performed using the standard protocol following bolus administration of intravenous contrast. CONTRAST:  163mL ISOVUE-300 IOPAMIDOL (ISOVUE-300) INJECTION 61% COMPARISON:  None. FINDINGS: Lower chest: There is streaky atelectasis at the RIGHT lung base. A 6 millimeter nodule is identified at the RIGHT lung base. LEFT lung base is clear. Hepatobiliary: Gallbladder is distended and otherwise  normal in CT appearance. The liver is homogeneous without focal mass. Pancreas: Unremarkable. No pancreatic ductal dilatation or surrounding inflammatory changes. Spleen: Normal in size without focal abnormality. Adrenals/Urinary Tract: Adrenal glands are normal in appearance. There is symmetric enhancement and excretion from both kidneys. No renal mass. No ureteral obstruction. The bladder and visualized portion of the urethra are normal. Stomach/Bowel: The stomach and small bowel loops are normal in appearance. The appendix is well seen and has a normal appearance. Loops of colon are normal in appearance. Vascular/Lymphatic: There is atherosclerotic calcification of the abdominal aorta not associated with aneurysm. No retroperitoneal or mesenteric adenopathy. Reproductive: The uterus is present.  No adnexal mass. Other: There is  a small amount of free pelvic fluid, of indeterminate etiology. Small fat containing paraumbilical hernia is present. Musculoskeletal: No acute or significant osseous findings. IMPRESSION: 1. No acute abnormality of the abdomen or pelvis. 2. Normal appendix. 3.  Aortic atherosclerosis.  (ICD10-I70.0) 4. Small amount of free pelvic fluid, of indeterminate the etiology/significance. 5. Small fat containing paraumbilical hernia. Electronically Signed: By: Nolon Nations M.D. On: 01/28/2018 21:15    Procedures Procedures (including critical care time)  Medications Ordered in ED Medications  iopamidol (ISOVUE-300) 61 % injection (has no administration in time range)  sodium chloride 0.9 % injection (has no administration in time range)  ondansetron (ZOFRAN) injection 4 mg (4 mg Intravenous Given 01/28/18 1931)  dicyclomine (BENTYL) injection 20 mg (20 mg Intramuscular Given 01/28/18 1933)  famotidine (PEPCID) IVPB 20 mg premix (0 mg Intravenous Stopped 01/28/18 2008)  sodium chloride 0.9 % bolus 1,000 mL (0 mLs Intravenous Stopped 01/28/18 2039)  iopamidol (ISOVUE-300) 61 % injection 100 mL (100 mLs Intravenous Contrast Given 01/28/18 2049)     Initial Impression / Assessment and Plan / ED Course  I have reviewed the triage vital signs and the nursing notes.  Pertinent labs & imaging results that were available during my care of the patient were reviewed by me and considered in my medical decision making (see chart for details).  Patient presents to the emergency department for evaluation of 2 days of generalized abdominal pain with associated nausea and diarrhea that began today.  No melena or hematochezia.  No fevers.  No urinary symptoms.  Patient was seen by her PCP with reassuring labs but given abdominal tenderness was sent for further evaluation.  I am unable to see the lab work done by her primary care doctor.  On arrival patient appears audible but is in no acute distress stable vitals.   She has diffuse tenderness with guarding throughout the abdomen.  Will get abdominal labs and CT abdomen pelvis.  We will give IV fluids, Bentyl, Zofran and Pepcid for symptomatic management.  Given abdominal exam and symptoms I suspect gastroenteritis will check CT for any other acute intra-abdominal pathology requiring surgical intervention such as diverticulitis or colitis.   Labs are overall very reassuring, no leukocytosis, normal hemoglobin, no acute electrolyte derangements requiring intervention, normal renal function liver function and normal lipase.  No evidence of urinary tract infection.  CT abdomen pelvis shows no acute intra-abdominal findings there is a small fat-containing periumbilical hernia no other acute findings.  I think symptoms are likely related to gastroenteritis, likely viral, on reevaluation after medications patient reports she is feeling much better.  I have will treat him home with Carafate, Pepcid, Zofran and Bentyl and have her follow closely with her primary care doctor.  Return precautions have been discussed.  Patient expresses understanding  of plan.  She is stable for discharge home in good condition.  Final Clinical Impressions(s) / ED Diagnoses   Final diagnoses:  Generalized abdominal pain  Nausea  Diarrhea, unspecified type    ED Discharge Orders         Ordered    dicyclomine (BENTYL) 20 MG tablet  2 times daily     01/28/18 2131    ondansetron (ZOFRAN ODT) 4 MG disintegrating tablet     01/28/18 2131    famotidine (PEPCID) 20 MG tablet  2 times daily     01/28/18 2131    sucralfate (CARAFATE) 1 GM/10ML suspension  3 times daily with meals & bedtime     01/28/18 2131           Jacqlyn Larsen, PA-C 01/29/18 1141    Drenda Freeze, MD 01/29/18 1451

## 2019-04-07 HISTORY — PX: COLONOSCOPY: SHX174

## 2019-05-21 ENCOUNTER — Other Ambulatory Visit: Payer: Self-pay

## 2019-05-21 ENCOUNTER — Emergency Department (HOSPITAL_COMMUNITY)
Admission: EM | Admit: 2019-05-21 | Discharge: 2019-05-22 | Disposition: A | Discharge status: Home / Self Care | Payer: No Typology Code available for payment source | Attending: Emergency Medicine | Admitting: Emergency Medicine

## 2019-05-21 ENCOUNTER — Encounter (HOSPITAL_COMMUNITY): Payer: Self-pay

## 2019-05-21 DIAGNOSIS — N390 Urinary tract infection, site not specified: Secondary | ICD-10-CM | POA: Insufficient documentation

## 2019-05-21 DIAGNOSIS — I10 Essential (primary) hypertension: Secondary | ICD-10-CM | POA: Insufficient documentation

## 2019-05-21 DIAGNOSIS — R109 Unspecified abdominal pain: Secondary | ICD-10-CM

## 2019-05-21 DIAGNOSIS — R1012 Left upper quadrant pain: Secondary | ICD-10-CM | POA: Diagnosis present

## 2019-05-21 DIAGNOSIS — Z79899 Other long term (current) drug therapy: Secondary | ICD-10-CM | POA: Diagnosis not present

## 2019-05-21 NOTE — ED Triage Notes (Signed)
Pt reports L flank pain that started yesterday. She reports that the area is swollen. Endorses a "pulling" sensation with urination. She denies hematuria or foul smelling urine. Denies nausea/ vomiting.

## 2019-05-22 ENCOUNTER — Other Ambulatory Visit: Payer: Self-pay

## 2019-05-22 ENCOUNTER — Emergency Department (HOSPITAL_COMMUNITY): Payer: No Typology Code available for payment source

## 2019-05-22 LAB — URINALYSIS, ROUTINE W REFLEX MICROSCOPIC
Bilirubin Urine: NEGATIVE
Glucose, UA: NEGATIVE mg/dL
Ketones, ur: NEGATIVE mg/dL
Nitrite: NEGATIVE
Protein, ur: 30 mg/dL — AB
Specific Gravity, Urine: 1.005 (ref 1.005–1.030)
WBC, UA: >50 WBC/hpf — ABNORMAL HIGH (ref 0–5)
pH: 6 (ref 5.0–8.0)

## 2019-05-22 LAB — BASIC METABOLIC PANEL
Anion gap: 9 (ref 5–15)
BUN: 11 mg/dL (ref 8–23)
CO2: 24 mmol/L (ref 22–32)
Calcium: 9.3 mg/dL (ref 8.9–10.3)
Chloride: 108 mmol/L (ref 98–111)
Creatinine, Ser: 0.68 mg/dL (ref 0.44–1.00)
GFR calc Af Amer: >60 mL/min (ref >60)
GFR calc non Af Amer: >60 mL/min (ref >60)
Glucose, Bld: 117 mg/dL — ABNORMAL HIGH (ref 70–99)
Potassium: 3.7 mmol/L (ref 3.5–5.1)
Sodium: 141 mmol/L (ref 135–145)

## 2019-05-22 LAB — CBC
HCT: 41.8 % (ref 36.0–46.0)
Hemoglobin: 13.5 g/dL (ref 12.0–15.0)
MCH: 32.1 pg (ref 26.0–34.0)
MCHC: 32.3 g/dL (ref 30.0–36.0)
MCV: 99.5 fL (ref 80.0–100.0)
Platelets: 346 10*3/uL (ref 150–400)
RBC: 4.2 MIL/uL (ref 3.87–5.11)
RDW: 13.2 % (ref 11.5–15.5)
WBC: 8.7 10*3/uL (ref 4.0–10.5)
nRBC: 0 % (ref 0.0–0.2)

## 2019-05-22 MED ORDER — IBUPROFEN 200 MG PO TABS
400.0000 mg | ORAL_TABLET | Freq: Once | ORAL | Status: AC
  Administered 2019-05-22: 400 mg via ORAL
  Filled 2019-05-22: qty 2

## 2019-05-22 MED ORDER — CEPHALEXIN 500 MG PO CAPS: 500.0000 mg | ORAL_CAPSULE | Freq: Three times a day (TID) | ORAL | 0 refills | Status: DC

## 2019-05-22 MED ORDER — CEPHALEXIN 500 MG PO CAPS
500.0000 mg | ORAL_CAPSULE | Freq: Once | ORAL | Status: AC
  Administered 2019-05-22: 02:00:00 500 mg via ORAL
  Filled 2019-05-22: qty 1

## 2019-05-22 NOTE — ED Notes (Signed)
Patient complaining of left plank pain since yesterday. Patient reports pain when urinating.

## 2019-05-22 NOTE — ED Provider Notes (Signed)
[facility] DEPT Provider Note   CSN: [id number] Arrival date & time: 05/21/19  2333   History Chief Complaint  Patient presents with   Flank Pain    Tasha Nguyen is a 64 y.o. female.  The history is provided by the patient.  Flank Pain  She has history of hypertension, hyperlipidemia and comes in complaining of pain in the left flank which started yesterday.  She noted a pulling sensation in that area when she urinated.  She noted that there is swelling in that area.  She wonders if she may have strained it But does not recall any specific trauma.  Pain is relatively modest and she rates it at 3/10.  She has taken acetaminophen which has given slight relief.  She denies fever, chills, sweats.  She denies nausea or vomiting.  History reviewed. No pertinent past medical history.  Patient Active Problem List   Diagnosis Date Noted   HYPERLIPIDEMIA 08/16/2008   HYPERTENSION 08/16/2008   KNEE PAIN, RIGHT 08/16/2008    History reviewed. No pertinent surgical history.   OB History   No obstetric history on file.     Family History  Problem Relation Age of Onset   CAD Father    CAD Sister    CAD Other    Diabetes Other     Social History   Tobacco Use   Smoking status: Never Smoker   Smokeless tobacco: Never Used  Substance Use Topics   Alcohol use: Yes    Comment: weekends   Drug use: Yes    Types: Marijuana    Comment: occ    Home Medications Prior to Admission medications   Medication Sig Start Date End Date Taking? Authorizing Provider  ciprofloxacin (CIPRO) 500 MG tablet Take 1 tablet (500 mg total) by mouth 2 (two) times daily. 05/20/15   Delos Haring, PA-C  dicyclomine (BENTYL) 20 MG tablet Take 1 tablet (20 mg total) by mouth 2 (two) times daily. 01/28/18   Jacqlyn Larsen, PA-C  famotidine (PEPCID) 20 MG tablet Take 1 tablet (20 mg total) by mouth 2 (two) times daily. 01/28/18   Jacqlyn Larsen, PA-C    meloxicam (MOBIC) 15 MG tablet Take 1 tablet (15 mg total) by mouth daily. 03/20/15   Harrison Mons, PA  ondansetron (ZOFRAN ODT) 4 MG disintegrating tablet 4mg  ODT q4 hours prn nausea/vomit 01/28/18   Jacqlyn Larsen, PA-C  sucralfate (CARAFATE) 1 GM/10ML suspension Take 10 mLs (1 g total) by mouth 4 (four) times daily -  with meals and at bedtime. 01/28/18   Jacqlyn Larsen, PA-C    Allergies    Hydrocodone-acetaminophen, Oxycodone, and Oxycodone-acetaminophen  Review of Systems   Review of Systems  Genitourinary: Positive for flank pain.  All other systems reviewed and are negative.   Physical Exam Updated Vital Signs BP (!) 132/97 (BP Location: Left Arm)    Pulse 83    Temp 98.6 F (37 C) (Oral)    Resp 18    SpO2 97%   Physical Exam Vitals and nursing note reviewed.   64 year old female, resting comfortably and in no acute distress. Vital signs are significant for mildly elevated blood pressure. Oxygen saturation is 97%, which is normal. Head is normocephalic and atraumatic. PERRLA, EOMI. Oropharynx is clear. Neck is nontender and supple without adenopathy or JVD. Back: There is tenderness over the left posterior lower rib cage with some soft tissue swelling noted over the lower rib cage corresponding  to the area of tenderness.  There is no erythema or warmth.  There is no crepitus. Lungs are clear without rales, wheezes, or rhonchi. Chest is nontender. Heart has regular rate and rhythm without murmur. Abdomen is soft, flat, nontender without masses or hepatosplenomegaly and peristalsis is normoactive. Extremities have no cyanosis or edema, full range of motion is present. Skin is warm and dry without rash. Neurologic: Mental status is normal, cranial nerves are intact, there are no motor or sensory deficits.  ED Results / Procedures / Treatments   Labs (all labs ordered are listed, but only abnormal results are displayed) Labs Reviewed  URINALYSIS, ROUTINE W REFLEX  MICROSCOPIC - Abnormal; Notable for the following components:      Result Value   APPearance CLOUDY (*)    Hgb urine dipstick MODERATE (*)    Protein, ur 30 (*)    Leukocytes,Ua LARGE (*)    WBC, UA >50 (*)    Bacteria, UA MANY (*)    All other components within normal limits  BASIC METABOLIC PANEL - Abnormal; Notable for the following components:   Glucose, Bld 117 (*)    All other components within normal limits  URINE CULTURE  CBC   Radiology CT Renal Stone Study  Result Date: 05/22/2019 CLINICAL DATA:  Flank pain. Left-sided flank pain. EXAM: CT ABDOMEN AND PELVIS WITHOUT CONTRAST TECHNIQUE: Multidetector CT imaging of the abdomen and pelvis was performed following the standard protocol without IV contrast. COMPARISON:  01/28/2018 FINDINGS: Lower chest: The lung bases are clear. The heart size is normal. Hepatobiliary: The liver is normal. Normal gallbladder.There is no biliary ductal dilation. Pancreas: Normal contours without ductal dilatation. No peripancreatic fluid collection. Spleen: No splenic laceration or hematoma. Adrenals/Urinary Tract: --Adrenal glands: No adrenal hemorrhage. --Right kidney/ureter: No hydronephrosis or perinephric hematoma. --Left kidney/ureter: No hydronephrosis or perinephric hematoma. --Urinary bladder: The urinary bladder is moderately distended. Stomach/Bowel: --Stomach/Duodenum: No hiatal hernia or other gastric abnormality. Normal duodenal course and caliber. --Small bowel: No dilatation or inflammation. --Colon: No focal abnormality. --Appendix: Not visualized. No right lower quadrant inflammation or free fluid. Vascular/Lymphatic: Atherosclerotic calcification is present within the non-aneurysmal abdominal aorta, without hemodynamically significant stenosis. --No retroperitoneal lymphadenopathy. --No mesenteric lymphadenopathy. --No pelvic or inguinal lymphadenopathy. Reproductive: Unremarkable Other: No ascites or free air. The abdominal wall is normal.  Musculoskeletal. No acute displaced fractures. IMPRESSION: No CT findings to explain left flank pain. Electronically Signed   By: Constance Holster M.D.   On: 05/22/2019 01:40    Procedures Procedures   Medications Ordered in ED Medications - No data to display  ED Course  I have reviewed the triage vital signs and the nursing notes.  Pertinent labs & imaging results that were available during my care of the patient were reviewed by me and considered in my medical decision making (see chart for details).  MDM Rules/Calculators/A&P Left flank pain with some soft tissue swelling suggesting musculoskeletal injury.  Will check urinalysis.  Old records are reviewed and she had a CT of abdomen and pelvis January 28, 2018 at which time a 6 mm nodule was noted at the right lung base with the recommended follow-up CT.  Patient states that follow-up CT was never obtained.  Will obtain renal protocol CT scan which should also visualize the prior nodule.  CT scan is unremarkable.  No evidence of lung nodule, rib fracture, urolithiasis.  Urinalysis does show evidence of infection specimen is sent for culture.  She is discharged with prescription for cephalexin.  She did have moderate relief of pain with ibuprofen, advised to use over-the-counter NSAIDs plus acetaminophen as needed for pain.  Return precautions discussed.  Final Clinical Impression(s) / ED Diagnoses Final diagnoses:  Left flank pain  Urinary tract infection with hematuria, site unspecified    Rx / DC Orders ED Discharge Orders         Ordered    cephALEXin (KEFLEX) 500 MG capsule  3 times daily     05/22/19 [id number]           Delora Fuel, MD [id number] [phone]

## 2019-05-22 NOTE — Discharge Instructions (Addendum)
Your tests showed you have a urinary infection.  That may be what is causing your pain.  Alternatively, you may have pulled a muscle in that area.  Apply ice to the area that is sore.  Keep ice on it for 30 minutes at a time, 3-4 times a day.  Take ibuprofen or naproxen as needed for pain.  You may take acetaminophen in addition to the ibuprofen or naproxen to get additional pain relief.  Return if you develop a high fever, start vomiting, or if pain is not being adequately controlled at home.

## 2019-05-24 LAB — URINE CULTURE: Culture: >=100000 — AB

## 2019-05-25 ENCOUNTER — Telehealth: Payer: Self-pay

## 2019-05-25 NOTE — Progress Notes (Signed)
ED Antimicrobial Stewardship Positive Culture Follow Up   Tasha Nguyen is an 64 y.o. female who presented to [facility] on 05/21/2019 with a chief complaint of  Chief Complaint  Patient presents with  . Flank Pain    Recent Results (from the past 720 hour(s))  Urine culture     Status: Abnormal   Collection Time: 05/22/19 12:19 AM   Specimen: Urine, Clean Catch  Result Value Ref Range Status   Specimen Description URINE, CLEAN CATCH  Final   Special Requests   Final    NONE Performed at [facility] [address], [place], [place] [address]    Culture (A)  Final    >=100,000 COLONIES/mL ESCHERICHIA COLI Confirmed Extended Spectrum Beta-Lactamase Producer (ESBL).  In bloodstream infections from ESBL organisms, carbapenems are preferred over piperacillin/tazobactam. They are shown to have a lower risk of mortality.    Report Status 05/24/2019 FINAL  Final   Organism ID, Bacteria ESCHERICHIA COLI (A)  Final      Susceptibility   Escherichia coli - MIC*    AMPICILLIN >=32 RESISTANT Resistant     CEFAZOLIN >=64 RESISTANT Resistant     CEFTRIAXONE >=64 RESISTANT Resistant     CIPROFLOXACIN >=4 RESISTANT Resistant     GENTAMICIN >=16 RESISTANT Resistant     IMIPENEM <=0.25 SENSITIVE Sensitive     NITROFURANTOIN <=16 SENSITIVE Sensitive     TRIMETH/SULFA >=320 RESISTANT Resistant     AMPICILLIN/SULBACTAM >=32 RESISTANT Resistant     PIP/TAZO 8 SENSITIVE Sensitive     * >=100,000 COLONIES/mL ESCHERICHIA COLI   2/15 Renal Stone CT was negative for source of L flank pain.   [x]  Treated with Keflex, organism resistant to prescribed antimicrobial []  Patient discharged originally without antimicrobial agent and treatment is now indicated  New antibiotic prescription: Macrobid 100mg  Po BID x 5 days  ED Provider: Cleophas Dunker, PharmD 05/25/2019, 10:22 AM Clinical Pharmacist [phone]

## 2019-05-25 NOTE — Telephone Encounter (Signed)
Post ED Visit - Positive Culture Follow-up: Successful Patient Follow-Up  Culture assessed and recommendations reviewed by:  []  Elenor Quinones, Pharm.D. []  Heide Guile, Pharm.D., BCPS AQ-ID []  Parks Neptune, Pharm.D., BCPS []  Alycia Rossetti, Pharm.D., BCPS []  [place], Pharm.D., BCPS, AAHIVP [x]  Legrand Como, Pharm.D., BCPS, AAHIVP []  Salome Arnt, PharmD, BCPS []  Johnnette Gourd, PharmD, BCPS []  Hughes Better, PharmD, BCPS []  Leeroy Cha, PharmD  Positive urine culture  []  Patient discharged without antimicrobial prescription and treatment is now indicated [x]  Organism is resistant to prescribed ED discharge antimicrobial []  Patient with positive blood cultures  Changes discussed with ED provider: Lacretia Leigh MD New antibiotic prescription Microbid 100 mg POP BId x 5 days Called to [facility] [phone]  Contacted patient, date 05/25/19, time 1425   Tasha Nguyen, [name] 05/25/2019, 2:24 PM

## 2019-10-03 ENCOUNTER — Telehealth: Payer: Self-pay | Admitting: General Practice

## 2019-10-03 NOTE — Telephone Encounter (Signed)
Called patient and LVM to return call and schedule a new patient appt with Dr. Joya Gaskins in July for HTN.

## 2019-10-23 ENCOUNTER — Other Ambulatory Visit: Payer: Self-pay

## 2019-10-23 ENCOUNTER — Encounter: Payer: Self-pay | Admitting: Critical Care Medicine

## 2019-10-23 ENCOUNTER — Ambulatory Visit
Payer: No Typology Code available for payment source | Attending: Critical Care Medicine | Admitting: Critical Care Medicine

## 2019-10-23 VITALS — BP 136/95 | HR 88 | Temp 97.2°F | Resp 17 | Ht 64.0 in | Wt 129.0 lb

## 2019-10-23 DIAGNOSIS — I10 Essential (primary) hypertension: Secondary | ICD-10-CM | POA: Diagnosis not present

## 2019-10-23 DIAGNOSIS — Z114 Encounter for screening for human immunodeficiency virus [HIV]: Secondary | ICD-10-CM

## 2019-10-23 DIAGNOSIS — Z1231 Encounter for screening mammogram for malignant neoplasm of breast: Secondary | ICD-10-CM | POA: Diagnosis not present

## 2019-10-23 DIAGNOSIS — E782 Mixed hyperlipidemia: Secondary | ICD-10-CM

## 2019-10-23 DIAGNOSIS — Z23 Encounter for immunization: Secondary | ICD-10-CM

## 2019-10-23 DIAGNOSIS — Z7689 Persons encountering health services in other specified circumstances: Secondary | ICD-10-CM

## 2019-10-23 DIAGNOSIS — L84 Corns and callosities: Secondary | ICD-10-CM

## 2019-10-23 DIAGNOSIS — Z7289 Other problems related to lifestyle: Secondary | ICD-10-CM

## 2019-10-23 DIAGNOSIS — H547 Unspecified visual loss: Secondary | ICD-10-CM

## 2019-10-23 DIAGNOSIS — F1090 Alcohol use, unspecified, uncomplicated: Secondary | ICD-10-CM

## 2019-10-23 DIAGNOSIS — Z1159 Encounter for screening for other viral diseases: Secondary | ICD-10-CM

## 2019-10-23 DIAGNOSIS — Z1211 Encounter for screening for malignant neoplasm of colon: Secondary | ICD-10-CM

## 2019-10-23 DIAGNOSIS — Z789 Other specified health status: Secondary | ICD-10-CM | POA: Insufficient documentation

## 2019-10-23 MED ORDER — AMLODIPINE BESYLATE 10 MG PO TABS: 10.0000 mg | ORAL_TABLET | Freq: Every day | ORAL | 3 refills | Status: DC

## 2019-10-23 NOTE — Assessment & Plan Note (Signed)
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

## 2019-10-23 NOTE — Progress Notes (Addendum)
Subjective:    Patient ID: Tasha Nguyen, female    DOB: 08-27-1955, 64 y.o.   MRN: [id number]  63 y.o.F here to establish pcp.  This patient has not been seen by physician in over 10 years and has a history of hypertension and hyperlipidemia  Patient comes in today off medications no known prior history of diabetes except for positive family history.  She does need an eye exam.  Patient also needs a mammogram and labs obtained including complete metabolic panel blood count and lipid panel.  She complains of pain in the left foot from a callus on the plantar aspect of the foot.  She also is due up a colonoscopy.  And Pap smear is also needed.  Patient states no real headaches.  She denies any chest pain.  Blood pressure has been ranging up up.  The patient also drinks about 18 beers in 2 to 3 days.  She does not smoke cigarettes at this time.  She has no other systemic complaints at this time  Note the patient has received her Covid vaccine and this was documented.  The patient is doing hepatitis C and HIV study   No past medical history on file.   Family History  Problem Relation Age of Onset  . CAD Father   . CAD Sister   . CAD Other   . Diabetes Other      Social History   Socioeconomic History  . Marital status: Single    Spouse name: Not on file  . Number of children: Not on file  . Years of education: Not on file  . Highest education level: Not on file  Occupational History  . Not on file  Tobacco Use  . Smoking status: Never Smoker  . Smokeless tobacco: Never Used  Vaping Use  . Vaping Use: Never used  Substance and Sexual Activity  . Alcohol use: Yes    Comment: weekends  . Drug use: Yes    Types: Marijuana    Comment: occ  . Sexual activity: Yes    Birth control/protection: None  Other Topics Concern  . Not on file  Social History Narrative  . Not on file   Social Determinants of Health   Financial Resource Strain:   . Difficulty of Paying Living  Expenses: Not on file  Food Insecurity:   . Worried About Charity fundraiser in the Last Year: Not on file  . Ran Out of Food in the Last Year: Not on file  Transportation Needs:   . Lack of Transportation (Medical): Not on file  . Lack of Transportation (Non-Medical): Not on file  Physical Activity:   . Days of Exercise per Week: Not on file  . Minutes of Exercise per Session: Not on file  Stress:   . Feeling of Stress : Not on file  Social Connections:   . Frequency of Communication with Friends and Family: Not on file  . Frequency of Social Gatherings with Friends and Family: Not on file  . Attends Religious Services: Not on file  . Active Member of Clubs or Organizations: Not on file  . Attends Archivist Meetings: Not on file  . Marital Status: Not on file  Intimate Partner Violence:   . Fear of Current or Ex-Partner: Not on file  . Emotionally Abused: Not on file  . Physically Abused: Not on file  . Sexually Abused: Not on file     Allergies  Allergen Reactions  .  Hydrocodone-Acetaminophen     Other reaction(s): Other (See Comments) unknown  . Oxycodone Other (See Comments)    Hallucinations Other reaction(s): Other (See Comments) Hallucinations  . Oxycodone-Acetaminophen     Other reaction(s): Other (See Comments) unknown     Outpatient Medications Prior to Visit  Medication Sig Dispense Refill  . cephALEXin (KEFLEX) 500 MG capsule Take 1 capsule (500 mg total) by mouth 3 (three) times daily. 30 capsule 0   No facility-administered medications prior to visit.     Review of Systems  Constitutional: Negative.   HENT: Negative.   Eyes: Positive for visual disturbance. Negative for pain, discharge, redness and itching.  Respiratory: Positive for cough. Negative for choking, chest tightness, shortness of breath, wheezing and stridor.   Cardiovascular: Negative for chest pain, palpitations and leg swelling.  Gastrointestinal: Negative.   Endocrine:  Negative for cold intolerance, heat intolerance and polyuria.  Genitourinary: Negative.   Musculoskeletal: Negative.   Skin: Negative.   Allergic/Immunologic: Negative.   Neurological: Negative.  Negative for seizures.  Hematological: Negative for adenopathy. Bruises/bleeds easily.  Psychiatric/Behavioral: Negative.        Objective:   Physical Exam Vitals:   10/23/19 1028  BP: (!) 136/95  Pulse: 88  Resp: 17  Temp: (!) 97.2 F (36.2 C)  TempSrc: Temporal  SpO2: 98%  Weight: 129 lb (58.5 kg)  Height: 5\' 4"  (1.626 m)    Gen: Pleasant, thin, in no distress,  normal affect  ENT: No lesions,  mouth clear,  oropharynx clear, no postnasal drip  Neck: No JVD, no TMG, no carotid bruits  Lungs: No use of accessory muscles, no dullness to percussion, clear without rales or rhonchi  Cardiovascular: RRR, heart sounds normal, no murmur or gallops, no peripheral edema  Abdomen: soft and NT, no HSM,  BS normal  Musculoskeletal: No deformities, no cyanosis or clubbing  Neuro: alert, non focal  Skin: Warm, no lesions or rashes        Assessment & Plan:  I personally reviewed all images and lab data in the [facility] system as well as any outside material available during this office visit and agree with the  radiology impressions.   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  We will refer this patient to Christa See our clinical social worker  Mixed hyperlipidemia Mixed hyperlipidemia  Plan will be for the patient to have her lipid panel checked at this visit   Diagnoses and all orders for this visit:  Essential  hypertension -     CBC with Differential -     Comprehensive metabolic panel -     Lipid Panel  Encounter to establish care  Mixed hyperlipidemia -     Lipid Panel  Breast cancer screening by mammogram -     Cancel: MM Digital Screening; Future -     MM 3D SCREEN BREAST BILATERAL; Future  Colon cancer screening -     Ambulatory referral to Gastroenterology  Need for hepatitis C screening test -     Hepatitis C Antibody  Screening for HIV (human immunodeficiency virus) -     HIV antibody (with reflex)  Foot  callus -     Ambulatory referral to Podiatry  Alcohol use  Need for Tdap vaccination -     Tdap vaccine greater than or equal to 7yo IM  Decreased vision -     Ambulatory referral to Ophthalmology  Other orders -     amLODipine (NORVASC) 10 MG tablet; Take 1 tablet (10 mg total) by mouth daily.    Note we will also refer the patient for a Pap smear within the practice and a colonoscopy which is due  The patient was given a tetanus vaccine at this visit and mammogram was ordered  Also gave the patient samples of Ensure nutritional supplements to use because of some of the weight loss

## 2019-10-23 NOTE — Assessment & Plan Note (Signed)
Significant alcohol use in this patient  We will refer this patient to Christa See our clinical social worker

## 2019-10-23 NOTE — Assessment & Plan Note (Signed)
Mixed hyperlipidemia  Plan will be for the patient to have her lipid panel checked at this visit

## 2019-10-23 NOTE — Addendum Note (Signed)
Addended by: Asencion Noble E on: 10/23/2019 02:43 PM   Modules accepted: Orders

## 2019-10-23 NOTE — Patient Instructions (Signed)
A referral to podiatry will be made  Start amlodipine daily 10mg  for high blood pressure  Focus on reducing alcohol use.  An appointment with Christa See will be made for alcohol counseling  A mammogram will be scheduled,  A colonoscopy will be scheduled, A pap smear will be scheduled.  Labs today: HIV, Hep C  Metabolic panel, blood count, lipid panel   Return Dr Joya Gaskins one month

## 2019-10-23 NOTE — Assessment & Plan Note (Signed)
Hypertension which has not been treated recently  Plan will be for the patient to receive amlodipine 10 mg daily and follow-up metabolic panel and lipid panel

## 2019-10-24 ENCOUNTER — Other Ambulatory Visit: Payer: Self-pay | Admitting: Critical Care Medicine

## 2019-10-24 LAB — LIPID PANEL
Chol/HDL Ratio: 4.6 ratio — ABNORMAL HIGH (ref 0.0–4.4)
Cholesterol, Total: 253 mg/dL — ABNORMAL HIGH (ref 100–199)
HDL: 55 mg/dL (ref >39)
LDL Chol Calc (NIH): 151 mg/dL — ABNORMAL HIGH (ref 0–99)
Triglycerides: 260 mg/dL — ABNORMAL HIGH (ref 0–149)
VLDL Cholesterol Cal: 47 mg/dL — ABNORMAL HIGH (ref 5–40)

## 2019-10-24 LAB — CBC WITH DIFFERENTIAL/PLATELET
Basophils Absolute: 0 10*3/uL (ref 0.0–0.2)
Basos: 0 %
EOS (ABSOLUTE): 0.1 10*3/uL (ref 0.0–0.4)
Eos: 1 %
Hematocrit: 42.2 % (ref 34.0–46.6)
Hemoglobin: 13.9 g/dL (ref 11.1–15.9)
Immature Grans (Abs): 0 10*3/uL (ref 0.0–0.1)
Immature Granulocytes: 0 %
Lymphocytes Absolute: 2.1 10*3/uL (ref 0.7–3.1)
Lymphs: 42 %
MCH: 32.2 pg (ref 26.6–33.0)
MCHC: 32.9 g/dL (ref 31.5–35.7)
MCV: 98 fL — ABNORMAL HIGH (ref 79–97)
Monocytes Absolute: 0.3 10*3/uL (ref 0.1–0.9)
Monocytes: 5 %
Neutrophils Absolute: 2.5 10*3/uL (ref 1.4–7.0)
Neutrophils: 52 %
Platelets: 346 10*3/uL (ref 150–450)
RBC: 4.32 x10E6/uL (ref 3.77–5.28)
RDW: 13.1 % (ref 11.7–15.4)
WBC: 5 10*3/uL (ref 3.4–10.8)

## 2019-10-24 LAB — COMPREHENSIVE METABOLIC PANEL
ALT: 14 IU/L (ref 0–32)
AST: 18 IU/L (ref 0–40)
Albumin/Globulin Ratio: 1.5 (ref 1.2–2.2)
Albumin: 4.4 g/dL (ref 3.8–4.8)
Alkaline Phosphatase: 77 IU/L (ref 48–121)
BUN/Creatinine Ratio: 22 (ref 12–28)
BUN: 15 mg/dL (ref 8–27)
Bilirubin Total: 0.3 mg/dL (ref 0.0–1.2)
CO2: 20 mmol/L (ref 20–29)
Calcium: 9.8 mg/dL (ref 8.7–10.3)
Chloride: 106 mmol/L (ref 96–106)
Creatinine, Ser: 0.67 mg/dL (ref 0.57–1.00)
GFR calc Af Amer: 110 mL/min/{1.73_m2} (ref >59)
GFR calc non Af Amer: 95 mL/min/{1.73_m2} (ref >59)
Globulin, Total: 3 g/dL (ref 1.5–4.5)
Glucose: 88 mg/dL (ref 65–99)
Potassium: 4.5 mmol/L (ref 3.5–5.2)
Sodium: 142 mmol/L (ref 134–144)
Total Protein: 7.4 g/dL (ref 6.0–8.5)

## 2019-10-24 LAB — HEPATITIS C ANTIBODY: Hep C Virus Ab: <0.1 s/co ratio (ref 0.0–0.9)

## 2019-10-24 LAB — HIV ANTIBODY (ROUTINE TESTING W REFLEX): HIV Screen 4th Generation wRfx: NONREACTIVE

## 2019-10-24 MED ORDER — ATORVASTATIN CALCIUM 10 MG PO TABS: 10.0000 mg | ORAL_TABLET | Freq: Every day | ORAL | 3 refills | Status: DC

## 2019-11-18 IMAGING — CT CT ABD-PELV W/ CM
2 of 5 series · 15 of 46 positions shown, 17 images · IV contrast (iopamidol)
Comparison: None.

CLINICAL DATA: Pt c/o abd pains x 2 days. Diarrhea and nausea
started today. Was seen at PCP today and had blood work and UA done
there. Was told to come to ED imaging on abdomen.

EXAM:
CT ABDOMEN AND PELVIS WITH CONTRAST
TECHNIQUE: Multidetector CT imaging of the abdomen and pelvis was performed
using the standard protocol following bolus administration of
intravenous contrast.
CONTRAST:  100mL BSC3R4-SLL IOPAMIDOL (BSC3R4-SLL) INJECTION 61%

[Series 2: axial st · axial · 0.84mm/px · z∈[-21,+339]mm · 12 of 84 slices shown, 14 images]
[im 6/84  soft-tissue]
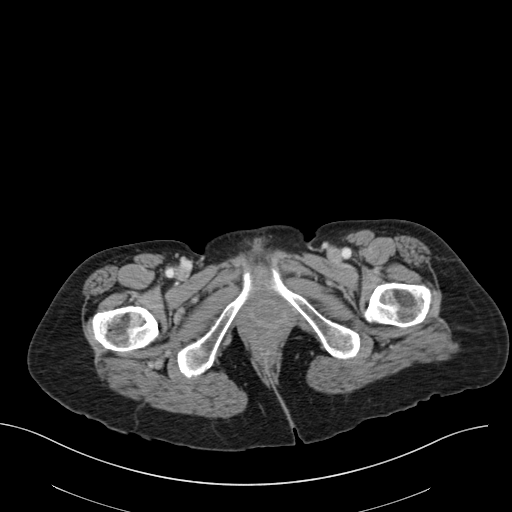
[im 6/84  bone]
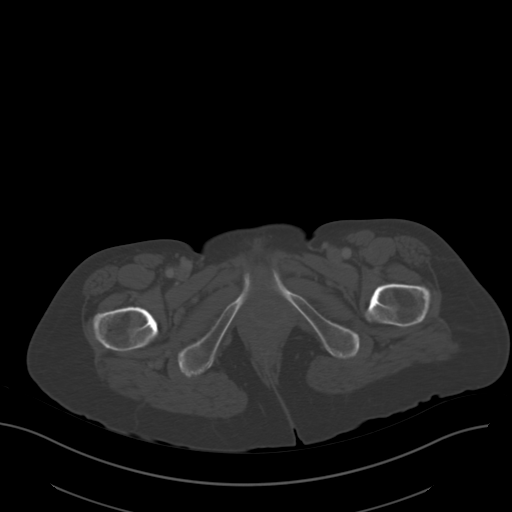
[im 11/84  soft-tissue]
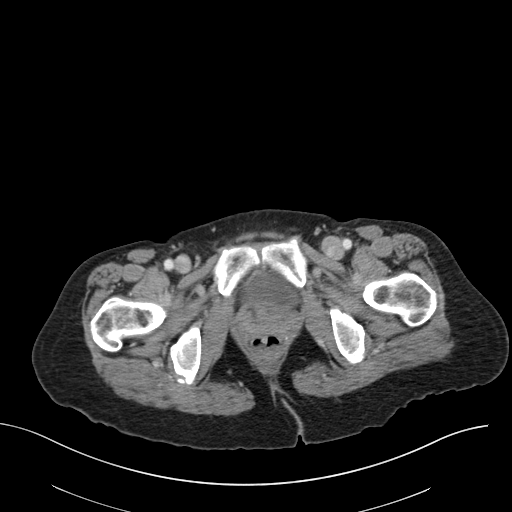
[im 21/84  soft-tissue]
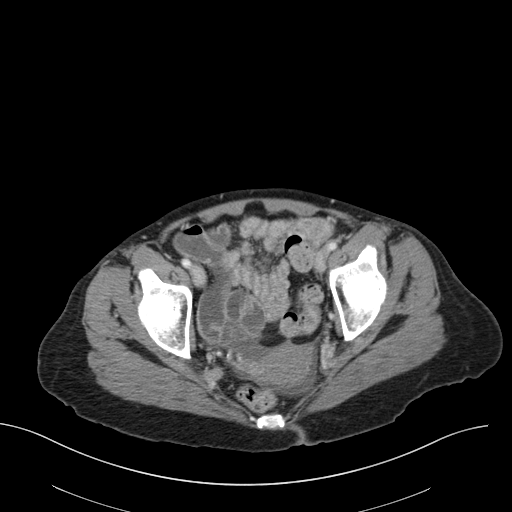
[im 26/84  soft-tissue]
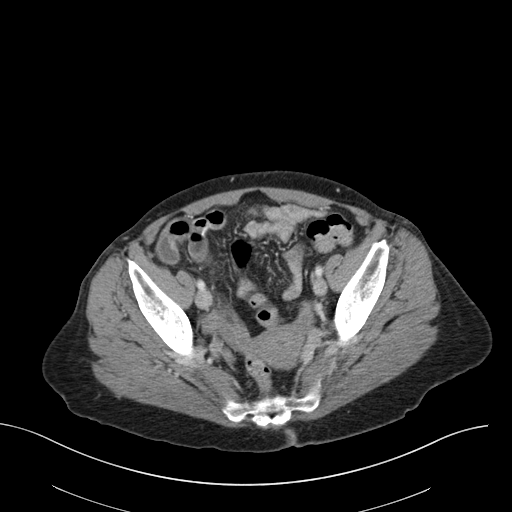
[im 32/84  soft-tissue]
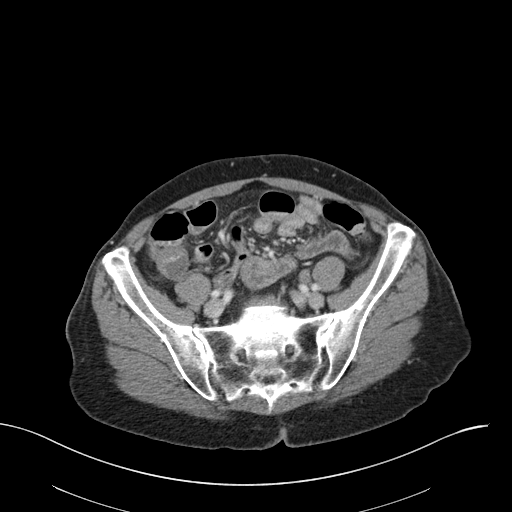
[im 37/84  soft-tissue]
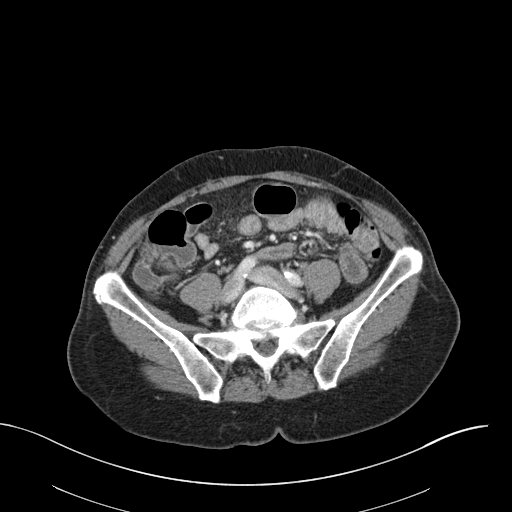
[im 47/84  soft-tissue]
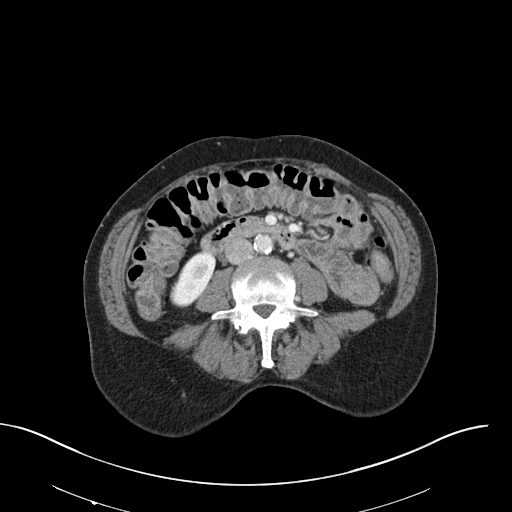
[im 52/84  soft-tissue]
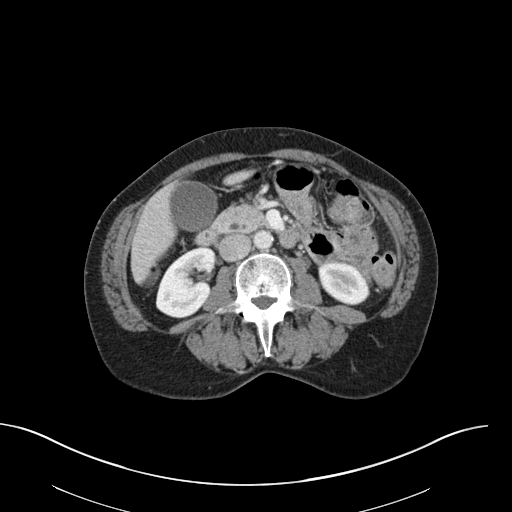
[im 58/84  soft-tissue]
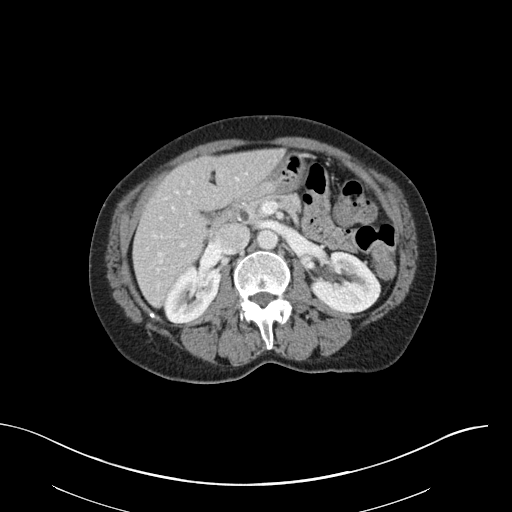
[im 58/84  bone]
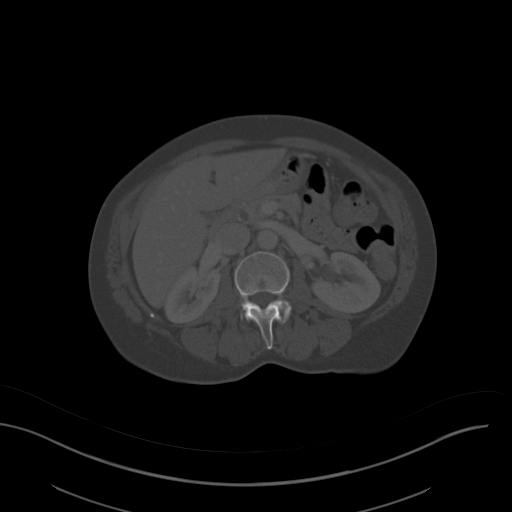
[im 63/84  soft-tissue]
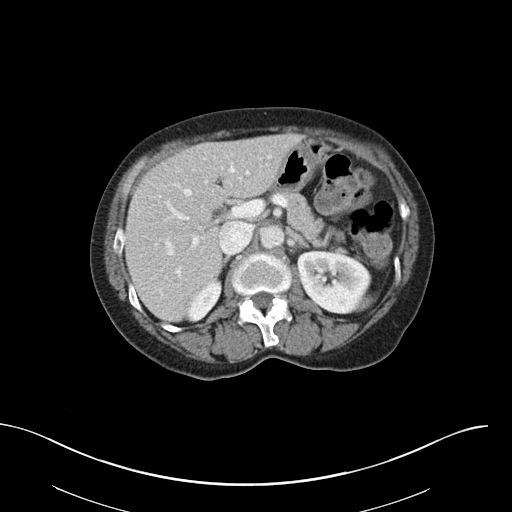
[im 73/84  soft-tissue]
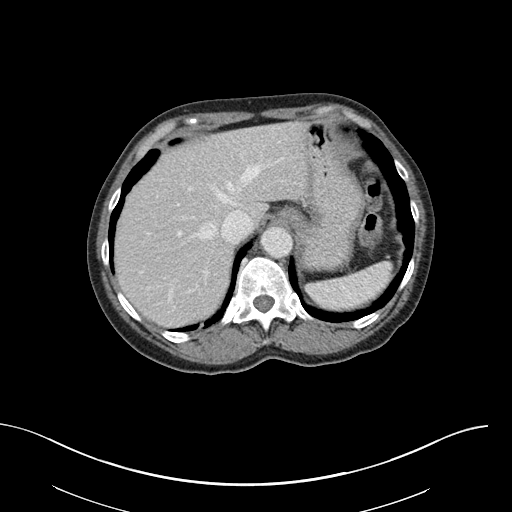
[im 78/84  soft-tissue]
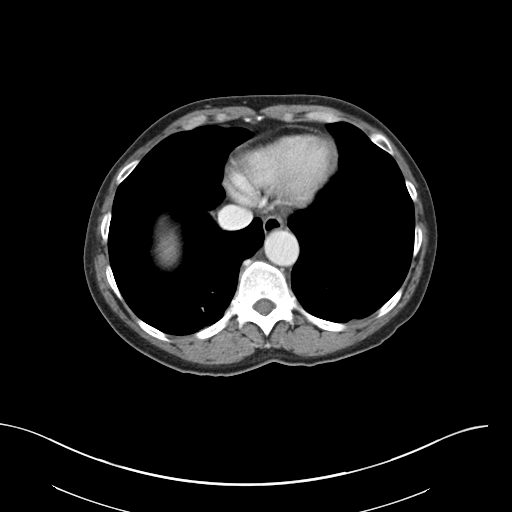

[Series 5: coronal st · coronal · 0.70mm/px · 3 of 104 slices shown]
[im 35/104  soft-tissue]
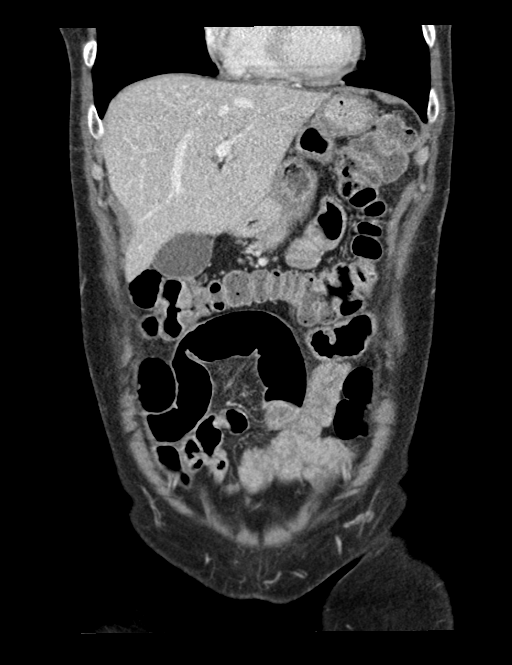
[im 46/104  soft-tissue]
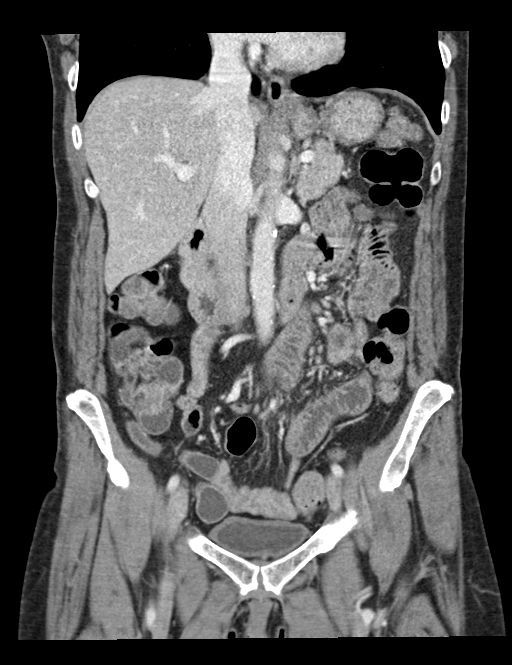
[im 58/104  soft-tissue]
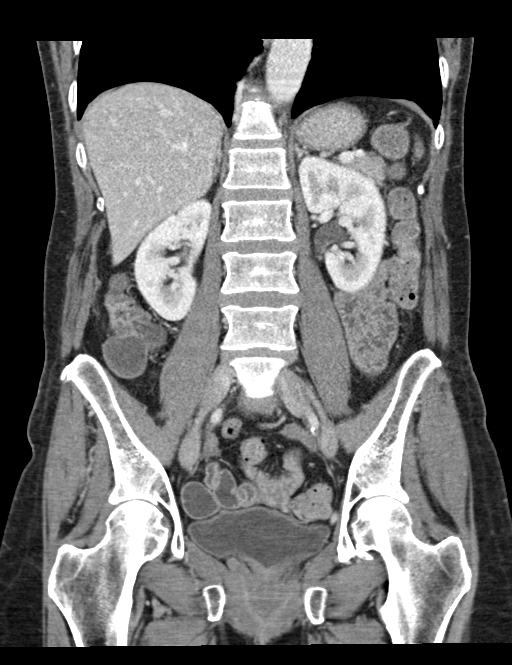

[15 of 46 positions shown; findings below may reference images not displayed]

IMPRESSION: 6. There is a 6 millimeter nodule at the RIGHT lung base, associated
with atelectasis within the RIGHT LOWER lobe. Non-contrast chest CT
at 6-12 months is recommended. If the nodule is stable at time of
repeat CT, then future CT at 18-24 months (from today's scan) is
considered optional for low-risk patients, but is recommended for
high-risk patients. This recommendation follows the consensus
statement: Guidelines for Management of Incidental Pulmonary Nodules
Detected on CT Images: From the [HOSPITAL] 3487; Radiology
3487; [DATE].
FINDINGS: Lower chest: There is streaky atelectasis at the RIGHT lung base. A
6 millimeter nodule is identified at the RIGHT lung base. LEFT lung
base is clear.

Hepatobiliary: Gallbladder is distended and otherwise normal in CT
appearance. The liver is homogeneous without focal mass.

Pancreas: Unremarkable. No pancreatic ductal dilatation or
surrounding inflammatory changes.

Spleen: Normal in size without focal abnormality.

Adrenals/Urinary Tract: Adrenal glands are normal in appearance.
There is symmetric enhancement and excretion from both kidneys. No
renal mass. No ureteral obstruction. The bladder and visualized
portion of the urethra are normal.

Stomach/Bowel: The stomach and small bowel loops are normal in
appearance. The appendix is well seen and has a normal appearance.
Loops of colon are normal in appearance.

Vascular/Lymphatic: There is atherosclerotic calcification of the
abdominal aorta not associated with aneurysm. No retroperitoneal or
mesenteric adenopathy.

Reproductive: The uterus is present.  No adnexal mass.

Other: There is a small amount of free pelvic fluid, of
indeterminate etiology. Small fat containing paraumbilical hernia is
present.

Musculoskeletal: No acute or significant osseous findings.
IMPRESSION: 1. No acute abnormality of the abdomen or pelvis.
2. Normal appendix.
3.  Aortic atherosclerosis.  ([id number])
4. Small amount of free pelvic fluid, of indeterminate the
etiology/significance.
5. Small fat containing paraumbilical hernia.

## 2019-11-23 ENCOUNTER — Encounter: Payer: Self-pay | Admitting: Critical Care Medicine

## 2019-11-23 ENCOUNTER — Ambulatory Visit
Payer: No Typology Code available for payment source | Attending: Critical Care Medicine | Admitting: Critical Care Medicine

## 2019-11-23 ENCOUNTER — Other Ambulatory Visit: Payer: Self-pay

## 2019-11-23 VITALS — BP 118/82 | HR 83 | Temp 97.2°F | Resp 16 | Wt 130.0 lb

## 2019-11-23 DIAGNOSIS — F411 Generalized anxiety disorder: Secondary | ICD-10-CM

## 2019-11-23 DIAGNOSIS — Z7289 Other problems related to lifestyle: Secondary | ICD-10-CM

## 2019-11-23 DIAGNOSIS — F5104 Psychophysiologic insomnia: Secondary | ICD-10-CM | POA: Insufficient documentation

## 2019-11-23 DIAGNOSIS — F4310 Post-traumatic stress disorder, unspecified: Secondary | ICD-10-CM | POA: Insufficient documentation

## 2019-11-23 DIAGNOSIS — Z789 Other specified health status: Secondary | ICD-10-CM

## 2019-11-23 DIAGNOSIS — I10 Essential (primary) hypertension: Secondary | ICD-10-CM

## 2019-11-23 MED ORDER — ATORVASTATIN CALCIUM 10 MG PO TABS: 10.0000 mg | ORAL_TABLET | Freq: Every day | ORAL | 3 refills | Status: DC

## 2019-11-23 NOTE — Patient Instructions (Addendum)
No change in medications  Referral to psychiatry was sent  See sleep hygiene tips below  Return Dr Joya Gaskins 6 months  Avoid alcohol    A pap smear will be arranged   Insomnia Insomnia is a sleep disorder that makes it difficult to fall asleep or stay asleep. Insomnia can cause fatigue, low energy, difficulty concentrating, mood swings, and poor performance at work or school. There are three different ways to classify insomnia:  Difficulty falling asleep.  Difficulty staying asleep.  Waking up too early in the morning. Any type of insomnia can be long-term (chronic) or short-term (acute). Both are common. Short-term insomnia usually lasts for three months or less. Chronic insomnia occurs at least three times a week for longer than three months. What are the causes? Insomnia may be caused by another condition, situation, or substance, such as:  Anxiety.  Certain medicines.  Gastroesophageal reflux disease (GERD) or other gastrointestinal conditions.  Asthma or other breathing conditions.  Restless legs syndrome, sleep apnea, or other sleep disorders.  Chronic pain.  Menopause.  Stroke.  Abuse of alcohol, tobacco, or illegal drugs.  Mental health conditions, such as depression.  Caffeine.  Neurological disorders, such as Alzheimer's disease.  An overactive thyroid (hyperthyroidism). Sometimes, the cause of insomnia may not be known. What increases the risk? Risk factors for insomnia include:  Gender. Women are affected more often than men.  Age. Insomnia is more common as you get older.  Stress.  Lack of exercise.  Irregular work schedule or working night shifts.  Traveling between different time zones.  Certain medical and mental health conditions. What are the signs or symptoms? If you have insomnia, the main symptom is having trouble falling asleep or having trouble staying asleep. This may lead to other symptoms, such as:  Feeling fatigued or  having low energy.  Feeling nervous about going to sleep.  Not feeling rested in the morning.  Having trouble concentrating.  Feeling irritable, anxious, or depressed. How is this diagnosed? This condition may be diagnosed based on:  Your symptoms and medical history. Your health care provider may ask about: ? Your sleep habits. ? Any medical conditions you have. ? Your mental health.  A physical exam. How is this treated? Treatment for insomnia depends on the cause. Treatment may focus on treating an underlying condition that is causing insomnia. Treatment may also include:  Medicines to help you sleep.  Counseling or therapy.  Lifestyle adjustments to help you sleep better. Follow these instructions at home: Eating and drinking   Limit or avoid alcohol, caffeinated beverages, and cigarettes, especially close to bedtime. These can disrupt your sleep.  Do not eat a large meal or eat spicy foods right before bedtime. This can lead to digestive discomfort that can make it hard for you to sleep. Sleep habits   Keep a sleep diary to help you and your health care provider figure out what could be causing your insomnia. Write down: ? When you sleep. ? When you wake up during the night. ? How well you sleep. ? How rested you feel the next day. ? Any side effects of medicines you are taking. ? What you eat and drink.  Make your bedroom a dark, comfortable place where it is easy to fall asleep. ? Put up shades or blackout curtains to block light from outside. ? Use a white noise machine to block noise. ? Keep the temperature cool.  Limit screen use before bedtime. This includes: ? Watching TV. ?  Using your smartphone, tablet, or computer.  Stick to a routine that includes going to bed and waking up at the same times every day and night. This can help you fall asleep faster. Consider making a quiet activity, such as reading, part of your nighttime routine.  Try to avoid  taking naps during the day so that you sleep better at night.  Get out of bed if you are still awake after 15 minutes of trying to sleep. Keep the lights down, but try reading or doing a quiet activity. When you feel sleepy, go back to bed. General instructions  Take over-the-counter and prescription medicines only as told by your health care provider.  Exercise regularly, as told by your health care provider. Avoid exercise starting several hours before bedtime.  Use relaxation techniques to manage stress. Ask your health care provider to suggest some techniques that may work well for you. These may include: ? Breathing exercises. ? Routines to release muscle tension. ? Visualizing peaceful scenes.  Make sure that you drive carefully. Avoid driving if you feel very sleepy.  Keep all follow-up visits as told by your health care provider. This is important. Contact a health care provider if:  You are tired throughout the day.  You have trouble in your daily routine due to sleepiness.  You continue to have sleep problems, or your sleep problems get worse. Get help right away if:  You have serious thoughts about hurting yourself or someone else. If you ever feel like you may hurt yourself or others, or have thoughts about taking your own life, get help right away. You can go to your nearest emergency department or call:  Your local emergency services (911 in the [place]  A suicide crisis helpline, such as the [facility] at 615-062-3078. This is open 24 hours a day. Summary  Insomnia is a sleep disorder that makes it difficult to fall asleep or stay asleep.  Insomnia can be long-term (chronic) or short-term (acute).  Treatment for insomnia depends on the cause. Treatment may focus on treating an underlying condition that is causing insomnia.  Keep a sleep diary to help you and your health care provider figure out what could be causing your  insomnia. This information is not intended to replace advice given to you by your health care provider. Make sure you discuss any questions you have with your health care provider. Document Revised: 03/05/2017 Document Reviewed: 12/31/2016 Elsevier Patient Education  2020 [employer].

## 2019-11-23 NOTE — Assessment & Plan Note (Signed)
I gave patient measures to improve her sleep hygiene

## 2019-11-23 NOTE — Assessment & Plan Note (Signed)
Ongoing alcohol use of advised continued work to be abstaining and will send to psychiatry for generalized anxiety disorder and posttraumatic stress

## 2019-11-23 NOTE — Assessment & Plan Note (Signed)
As per generalized anxiety will refer to psychiatry

## 2019-11-23 NOTE — Assessment & Plan Note (Signed)
Psychiatry referral given

## 2019-11-23 NOTE — Progress Notes (Signed)
Unable to sleep at night. Insomnia x 2 weeks.

## 2019-11-23 NOTE — Assessment & Plan Note (Signed)
Hypertension well controlled at this time we will continue amlodipine daily

## 2019-11-23 NOTE — Progress Notes (Signed)
Subjective:    Patient ID: Tasha Nguyen, female    DOB: Jan 05, 1956, 64 y.o.   MRN: [id number]  63 y.o.F here to establish pcp.  This patient has not been seen by physician in over 10 years and has a history of hypertension and hyperlipidemia  Patient comes in today off medications no known prior history of diabetes except for positive family history.  She does need an eye exam.  Patient also needs a mammogram and labs obtained including complete metabolic panel blood count and lipid panel.  She complains of pain in the left foot from a callus on the plantar aspect of the foot.  She also is due up a colonoscopy.  And Pap smear is also needed.  Patient states no real headaches.  She denies any chest pain.  Blood pressure has been ranging up up.  The patient also drinks about 18 beers in 2 to 3 days.  She does not smoke cigarettes at this time.  She has no other systemic complaints at this time  Note the patient has received her Covid vaccine and this was documented.  The patient is doing hepatitis C and HIV study  11/23/2019 The patient returns today doing well except she complains of insomnia and symptoms referable to her PTSD.  Patient's not able to sleep fully through the night however she goes to bed at 7 PM and watches TV right before she goes to sleep.  Patient is still using some alcohol trying to quit.  The patient is now on atorvastatin for mixed hyperlipidemia.  Patient maintains amlodipine 5 mg a day and doing well with this blood pressure is excellent on arrival 118/82  Patient is pending her colonoscopy there are no other complaints   No past medical history on file.   Family History  Problem Relation Age of Onset  . CAD Father   . CAD Sister   . CAD Other   . Diabetes Other      Social History   Socioeconomic History  . Marital status: Single    Spouse name: Not on file  . Number of children: Not on file  . Years of education: Not on file  . Highest education  level: Not on file  Occupational History  . Not on file  Tobacco Use  . Smoking status: Never Smoker  . Smokeless tobacco: Never Used  Vaping Use  . Vaping Use: Never used  Substance and Sexual Activity  . Alcohol use: Yes    Comment: weekends  . Drug use: Yes    Types: Marijuana    Comment: occ  . Sexual activity: Yes    Birth control/protection: None  Other Topics Concern  . Not on file  Social History Narrative  . Not on file   Social Determinants of Health   Financial Resource Strain:   . Difficulty of Paying Living Expenses: Not on file  Food Insecurity:   . Worried About Charity fundraiser in the Last Year: Not on file  . Ran Out of Food in the Last Year: Not on file  Transportation Needs:   . Lack of Transportation (Medical): Not on file  . Lack of Transportation (Non-Medical): Not on file  Physical Activity:   . Days of Exercise per Week: Not on file  . Minutes of Exercise per Session: Not on file  Stress:   . Feeling of Stress : Not on file  Social Connections:   . Frequency of Communication with Friends and  Family: Not on file  . Frequency of Social Gatherings with Friends and Family: Not on file  . Attends Religious Services: Not on file  . Active Member of Clubs or Organizations: Not on file  . Attends Archivist Meetings: Not on file  . Marital Status: Not on file  Intimate Partner Violence:   . Fear of Current or Ex-Partner: Not on file  . Emotionally Abused: Not on file  . Physically Abused: Not on file  . Sexually Abused: Not on file     Allergies  Allergen Reactions  . Hydrocodone-Acetaminophen     Other reaction(s): Other (See Comments) unknown  . Oxycodone Other (See Comments)    Hallucinations Other reaction(s): Other (See Comments) Hallucinations  . Oxycodone-Acetaminophen     Other reaction(s): Other (See Comments) unknown     Outpatient Medications Prior to Visit  Medication Sig Dispense Refill  . amLODipine (NORVASC)  10 MG tablet Take 1 tablet (10 mg total) by mouth daily. 90 tablet 3  . atorvastatin (LIPITOR) 10 MG tablet Take 1 tablet (10 mg total) by mouth daily. 60 tablet 3   No facility-administered medications prior to visit.     Review of Systems  Constitutional: Negative.   HENT: Negative.   Eyes: Positive for visual disturbance. Negative for pain, discharge, redness and itching.  Respiratory: Positive for cough. Negative for choking, chest tightness, shortness of breath, wheezing and stridor.   Cardiovascular: Negative for chest pain, palpitations and leg swelling.  Gastrointestinal: Negative.   Endocrine: Negative for cold intolerance, heat intolerance and polyuria.  Genitourinary: Negative.   Musculoskeletal: Negative.   Skin: Negative.   Allergic/Immunologic: Negative.   Neurological: Negative.  Negative for seizures.  Hematological: Negative for adenopathy. Bruises/bleeds easily.  Psychiatric/Behavioral: Negative.        Objective:   Physical Exam Vitals:   11/23/19 0910  BP: 118/82  Pulse: 83  Resp: 16  Temp: (!) 97.2 F (36.2 C)  SpO2: 98%  Weight: 130 lb (59 kg)    Gen: Pleasant, thin, in no distress,  normal affect  ENT: No lesions,  mouth clear,  oropharynx clear, no postnasal drip  Neck: No JVD, no TMG, no carotid bruits  Lungs: No use of accessory muscles, no dullness to percussion, clear without rales or rhonchi  Cardiovascular: RRR, heart sounds normal, no murmur or gallops, no peripheral edema  Abdomen: soft and NT, no HSM,  BS normal  Musculoskeletal: No deformities, no cyanosis or clubbing  Neuro: alert, non focal  Skin: Warm, no lesions or rashes        Assessment & Plan:  I personally reviewed all images and lab data in the [facility] system as well as any outside material available during this office visit and agree with the  radiology impressions.   Essential hypertension Hypertension well controlled at this time we will continue amlodipine  daily  Alcohol use Ongoing alcohol use of advised continued work to be abstaining and will send to psychiatry for generalized anxiety disorder and posttraumatic stress  Generalized anxiety disorder Psychiatry referral given  Psychophysiological insomnia I gave patient measures to improve her sleep hygiene  PTSD (post-traumatic stress disorder) As per generalized anxiety will refer to psychiatry   Geneal was seen today for follow-up.  Diagnoses and all orders for this visit:  PTSD (post-traumatic stress disorder) -     Ambulatory referral to Psychiatry  Generalized anxiety disorder -     Ambulatory referral to Psychiatry  Psychophysiological insomnia -  Ambulatory referral to Psychiatry  Essential hypertension  Alcohol use  Other orders -     atorvastatin (LIPITOR) 10 MG tablet; Take 1 tablet (10 mg total) by mouth daily.

## 2019-11-24 ENCOUNTER — Ambulatory Visit (INDEPENDENT_AMBULATORY_CARE_PROVIDER_SITE_OTHER): Payer: No Typology Code available for payment source | Admitting: Podiatry

## 2019-11-24 ENCOUNTER — Other Ambulatory Visit: Payer: Self-pay

## 2019-11-24 DIAGNOSIS — L989 Disorder of the skin and subcutaneous tissue, unspecified: Secondary | ICD-10-CM | POA: Diagnosis not present

## 2019-11-24 NOTE — Patient Instructions (Signed)

## 2019-11-27 ENCOUNTER — Other Ambulatory Visit: Payer: Self-pay

## 2019-11-27 ENCOUNTER — Ambulatory Visit
Admission: RE | Admit: 2019-11-27 | Discharge: 2019-11-27 | Disposition: A | Discharge status: Home / Self Care | Payer: No Typology Code available for payment source | Source: Ambulatory Visit | Attending: Critical Care Medicine | Admitting: Critical Care Medicine

## 2019-11-27 DIAGNOSIS — Z1231 Encounter for screening mammogram for malignant neoplasm of breast: Secondary | ICD-10-CM

## 2019-11-28 ENCOUNTER — Encounter: Payer: Self-pay | Admitting: Podiatry

## 2019-11-28 NOTE — Progress Notes (Signed)
  Subjective:  Patient ID: Tasha Nguyen, female    DOB: September 15, 1955,  MRN: [id number]  Chief Complaint  Patient presents with  . Callouses    painful callus lesion left foot    64 y.o. female presents with the above complaint.  Patient presents with a complaint of left submetatarsal two hyperkeratotic lesion and left lateral fifth digit hyperkeratotic lesion.  Patient states is painful to touch.  She has tried some shoe gear modification which has not helped.  She would like to know if this could be removed or debrided down.  She has not seen anyone else prior to seeing me.  She states the skin lesion have been hurting a lot.  She is constantly on her foot.  She denies any other acute complaints   Review of Systems: Negative except as noted in the HPI. Denies N/V/F/Ch.  No past medical history on file.  Current Outpatient Medications:  .  amLODipine (NORVASC) 10 MG tablet, Take 1 tablet (10 mg total) by mouth daily., Disp: 90 tablet, Rfl: 3 .  atorvastatin (LIPITOR) 10 MG tablet, Take 1 tablet (10 mg total) by mouth daily., Disp: 60 tablet, Rfl: 3  Social History   Tobacco Use  Smoking Status Never Smoker  Smokeless Tobacco Never Used    Allergies  Allergen Reactions  . Hydrocodone-Acetaminophen     Other reaction(s): Other (See Comments) unknown  . Oxycodone Other (See Comments)    Hallucinations Other reaction(s): Other (See Comments) Hallucinations  . Oxycodone-Acetaminophen     Other reaction(s): Other (See Comments) unknown   Objective:  There were no vitals filed for this visit. There is no height or weight on file to calculate BMI. Constitutional Well developed. Well nourished.  Vascular Dorsalis pedis pulses palpable bilaterally. Posterior tibial pulses palpable bilaterally. Capillary refill normal to all digits.  No cyanosis or clubbing noted. Pedal hair growth normal.  Neurologic Normal speech. Oriented to person, place, and time. Epicritic sensation to  light touch grossly present bilaterally.  Dermatologic  hyperkeratotic lesion noted to left submetatarsal two as well as left lateral fifth digit.  No pinpoint bleeding noted.  No central nucleated core noted.  Orthopedic: Normal joint ROM without pain or crepitus bilaterally. No visible deformities. No bony tenderness.   Radiographs: None Assessment:   1. Benign skin lesion    Plan:  Patient was evaluated and treated and all questions answered.  Left fifth digit and distal left submetatarsal two benign skin lesion -I explained patient the etiology of hyperkeratotic lesion/benign skin lesion various treatment options were extensively discussed.  This likely has to do the weightbearing distribution and tight shoes.  I discussed with her shoe gear modification and provided her with debridement of the hyperkeratotic lesion.  Patient states that she will make the shoe gear modification as we discussed as well as utilize toe protector. -Using chisel blade and a handle, the hyperkeratotic lesion was debrided down to healthy striated tissue.  No pinpoint bleeding noted.  No central nucleated core noted. -  Return in about 3 months (around 02/24/2020).

## 2019-12-02 ENCOUNTER — Telehealth: Payer: Self-pay

## 2019-12-02 NOTE — Telephone Encounter (Signed)
Contacted pt to go over MM results pt is aware and doesn't have any questions or concerns

## 2020-01-02 ENCOUNTER — Encounter: Payer: Self-pay | Admitting: Nurse Practitioner

## 2020-01-08 ENCOUNTER — Ambulatory Visit: Payer: No Typology Code available for payment source | Admitting: Nurse Practitioner

## 2020-02-01 ENCOUNTER — Encounter: Payer: Self-pay | Admitting: Nurse Practitioner

## 2020-02-01 ENCOUNTER — Ambulatory Visit (INDEPENDENT_AMBULATORY_CARE_PROVIDER_SITE_OTHER): Payer: No Typology Code available for payment source | Admitting: Nurse Practitioner

## 2020-02-01 VITALS — BP 130/70 | HR 68 | Ht 69.0 in | Wt 130.8 lb

## 2020-02-01 DIAGNOSIS — Z86010 Personal history of colonic polyps: Secondary | ICD-10-CM

## 2020-02-01 MED ORDER — SUTAB 1479-225-188 MG PO TABS: 1.0000 | ORAL_TABLET | ORAL | 0 refills | Status: DC

## 2020-02-01 NOTE — Progress Notes (Signed)
Agree with assessment and plan as outlined.  

## 2020-02-01 NOTE — Progress Notes (Signed)
02/01/2020 Tasha Nguyen [id number] Jun 30, 1955   CHIEF COMPLAINT: Schedule a colonoscopy   HISTORY OF PRESENT ILLNESS:  Tasha Nguyen is a 64 year old female with a past medical history of anxiety, hypertension, hyperlipidemia and colon polys. No past surgeries. She presents to our office today as referred by Dr. Asencion Noble to schedule a colonoscopy.  She reports completing a colonoscopy approximately 9 years ago by another GI office in [place] and a few polyps were removed.  She stated she was advised to repeat a colonoscopy in 6 years which was not done.  No family history of colon polyps or colon cancer.  She denies having any abdominal pain.  She is passing a normal [place] bowel movement daily.  No rectal bleeding or black stool.  Her weight is stable.  No GERD symptoms.  She reports feeling slightly short of breath for the past 2 days which she attributes to working 12-hour shifts 3 days weekly.  She was referred to psychiatry for anxiety treatment as recommended by Dr. Joya Gaskins.  She denies having any productive cough.  No chest pain or palpitations.  Significant family history of heart disease.  No fever.  She received 2 Covid vaccinations and intends to receive the Covid 19 booster within the next few weeks.  She drinks 2 beers daily. She smokes Marijuana 14 days monthly. No other complaints today.   CBC Latest Ref Rng & Units 10/23/2019 05/22/2019 01/28/2018  WBC 3.4 - 10.8 x10E3/uL 5.0 8.7 5.8  Hemoglobin 11.1 - 15.9 g/dL 13.9 13.5 14.9  Hematocrit 34.0 - 46.6 % 42.2 41.8 46.3(H)  Platelets 150 - 450 x10E3/uL 346 346 343      CMP Latest Ref Rng & Units 10/23/2019 05/22/2019 01/28/2018  Glucose 65 - 99 mg/dL 88 117(H) 94  BUN 8 - 27 mg/dL 15 11 10   Creatinine 0.57 - 1.00 mg/dL 0.67 0.68 0.54  Sodium 134 - 144 mmol/L 142 141 140  Potassium 3.5 - 5.2 mmol/L 4.5 3.7 3.4(L)  Chloride 96 - 106 mmol/L 106 108 108  CO2 20 - 29 mmol/L 20 24 22   Calcium 8.7 - 10.3 mg/dL 9.8 9.3  9.1  Total Protein 6.0 - 8.5 g/dL 7.4 - 7.4  Total Bilirubin 0.0 - 1.2 mg/dL 0.3 - 0.7  Alkaline Phos 48 - 121 IU/L 77 - 64  AST 0 - 40 IU/L 18 - 20  ALT 0 - 32 IU/L 14 - 15     Social History:  She smoked  cigarettes 1ppd x 7 or 8 years, quit smoking cigarettes 25 years ago. She drinks 2 light beers daily. She smokes marijuana 14 days monthly. No other drug use.   Family History: Father died age 64 with heart disease. Mother age 63 with a clotting disorder on a blood thinner.  Sister with heart disease, deceased. Two living sisters with heart disease. Cousin with breast cancer.    Allergies  Allergen Reactions   Hydrocodone-Acetaminophen     Other reaction(s): Other (See Comments) unknown   Oxycodone Other (See Comments)    Hallucinations Other reaction(s): Other (See Comments) Hallucinations   Oxycodone-Acetaminophen     Other reaction(s): Other (See Comments) unknown      Outpatient Encounter Medications as of 02/01/2020  Medication Sig   amLODipine (NORVASC) 10 MG tablet Take 1 tablet (10 mg total) by mouth daily.   atorvastatin (LIPITOR) 10 MG tablet Take 1 tablet (10 mg total) by mouth daily.   [DISCONTINUED] dicyclomine (BENTYL) 20  MG tablet Take 1 tablet (20 mg total) by mouth 2 (two) times daily.   [DISCONTINUED] famotidine (PEPCID) 20 MG tablet Take 1 tablet (20 mg total) by mouth 2 (two) times daily.   [DISCONTINUED] sucralfate (CARAFATE) 1 GM/10ML suspension Take 10 mLs (1 g total) by mouth 4 (four) times daily -  with meals and at bedtime.   No facility-administered encounter medications on file as of 02/01/2020.     REVIEW OF SYSTEMS:   Gen: Denies fever, sweats or chills. No weight loss.  CV: Denies chest pain, palpitations or edema. Resp: Denies cough, shortness of breath of hemoptysis.  GI: Denies heartburn, dysphagia, stomach or lower abdominal pain. No diarrhea or constipation.  GU : Denies urinary burning, blood in urine, increased urinary  frequency or incontinence. MS: Denies joint pain, muscles aches or weakness. Derm: Denies rash, itchiness, skin lesions or unhealing ulcers. Psych: + increased stress/anxiety. Heme: Denies bruising, bleeding. Neuro:  Denies headaches, dizziness or paresthesias. Endo:  Denies any problems with DM, thyroid or adrenal function.    PHYSICAL EXAM: BP 130/70    Pulse 68    Ht 5\' 9"  (1.753 m)    Wt 130 lb 12.8 oz (59.3 kg)    BMI 19.32 kg/m   General: Well developed 64 year old female in no acute distress. Head: Normocephalic and atraumatic. Eyes:  Sclerae non-icteric, conjunctive pink. Ears: Normal auditory acuity. Mouth: Dentition intact. No ulcers or lesions.  Neck: Supple, no lymphadenopathy or thyromegaly.  Lungs: Clear bilaterally to auscultation without wheezes, crackles or rhonchi. Heart: Regular rate and rhythm. No murmur, rub or gallop appreciated.  Abdomen: Soft, nontender, non distended. No masses. No hepatosplenomegaly. Normoactive bowel sounds x 4 quadrants.  Rectal: Deferred. Musculoskeletal: Symmetrical with no gross deformities. Skin: Warm and dry. No rash or lesions on visible extremities. Extremities: No edema. Neurological: Alert oriented x 4, no focal deficits.  Psychological:  Alert and cooperative. Normal mood and affect.  ASSESSMENT AND PLAN:  70. 64 year old female with a reported history of colon polyps per colonoscopy done approximately 9 years ago. -Our office will attempt to locate her past colonoscopy procedure/biopsy report  -Colonoscopy benefits and risks discussed including risk with sedation, risk of bleeding, perforation and infection -Further follow-up to be determined after colonoscopy completed  2. SOB x 2 days. No fever. +/- anxiety component.  -Advised the patient to follow up with Dr. Joya Gaskins if SOB persists or worsens. -Patient aware SOB symptom must be completely resolved prior to proceeding with a colonoscopy.          CC:  Elsie Stain, MD

## 2020-02-01 NOTE — Patient Instructions (Addendum)
Follow up with your primary care physician regarding your mild shortness of breath for 2 days.    You have been scheduled for a colonoscopy. Please follow written instructions given to you at your visit today.  Please pick up your prep supplies at the pharmacy within the next 1-3 days. If you use inhalers (even only as needed), please bring them with you on the day of your procedure.  Due to recent changes in healthcare laws, you may see the results of your imaging and laboratory studies on MyChart before your provider has had a chance to review them.  We understand that in some cases there may be results that are confusing or concerning to you. Not all laboratory results come back in the same time frame and the provider may be waiting for multiple results in order to interpret others.  Please give [place] 48 hours in order for your provider to thoroughly review all the results before contacting the office for clarification of your results.

## 2020-02-07 ENCOUNTER — Ambulatory Visit: Payer: Self-pay | Admitting: *Deleted

## 2020-02-07 ENCOUNTER — Ambulatory Visit: Payer: No Typology Code available for payment source | Attending: Critical Care Medicine | Admitting: Internal Medicine

## 2020-02-07 ENCOUNTER — Other Ambulatory Visit: Payer: Self-pay

## 2020-02-07 DIAGNOSIS — Z8249 Family history of ischemic heart disease and other diseases of the circulatory system: Secondary | ICD-10-CM | POA: Insufficient documentation

## 2020-02-07 DIAGNOSIS — R0789 Other chest pain: Secondary | ICD-10-CM | POA: Diagnosis not present

## 2020-02-07 NOTE — Progress Notes (Signed)
Pt states the pain has been going on for 4 days   Pt states the pain come and goes   Pt states she doesn't have any anxiety

## 2020-02-07 NOTE — Progress Notes (Signed)
Chest pain intermittently x 4 days.  Pain will last a "few seconds". She describes the pain as sharp and stabbing and resolves quickly.  The pain will occur once per day but did wake her from sleep once. It occurred at work today.  She does not remember any trauma to chest.  The pain is not associated with walking or exertion.   No hx of heart trouble. She is generally heatlhy and does not take any medications.   She smokes marijuana- no cigarettes.   FHx- father died with heart disease- MI Two younger sisters with "heart failure"- ? Cause.   This was a phone visit.  Patient with chest pain This does not sound cardiac but she has some risk factors (age, fhx, ? Smoking).   i'll  EKG and then send for stress test.

## 2020-02-07 NOTE — Telephone Encounter (Signed)
Left to mid intermittent CP over the last week. Sharp pain regardless of activity lasting only seconds. Latest episode was yesterday which took her breath briefly. The pain has not increased in severity/frequency. Pain does not radiate, denies dizziness/N/V/fever/sweating. No accident/injury. No B/P or HR taken. Denies sour taste in mouth. Reviewed urgent symptoms requiring immediate evaluation. Patient stated understanding. Warm transferred patient to [facility] to be scheduled within next 3 days according to disposition.   Reason for Disposition  [1] Chest pain from known angina comes and goes AND [2] is NOT happening more often (increasing in frequency) or getting worse (increasing in severity)  Answer Assessment - Initial Assessment Questions 1. LOCATION: "Where does it hurt?"       Left upper area of chest 2. RADIATION: "Does the pain go anywhere else?" (e.g., into neck, jaw, arms, back)     Yes, for the last 4 days.  3. ONSET: "When did the chest pain begin?" (Minutes, hours or days)      One week ago 4. PATTERN "Does the pain come and go, or has it been constant since it started?"  "Does it get worse with exertion?"      Comes and goes for one week 5. DURATION: "How long does it last" (e.g., seconds, minutes, hours)     seconds 6. SEVERITY: "How bad is the pain?"  (e.g., Scale 1-10; mild, moderate, or severe)    - MILD (1-3): doesn't interfere with normal activities     - MODERATE (4-7): interferes with normal activities or awakens from sleep    - SEVERE (8-10): excruciating pain, unable to do any normal activities       Sharp 8 on the scale 7. CARDIAC RISK FACTORS: "Do you have any history of heart problems or risk factors for heart disease?" (e.g., angina, prior heart attack; diabetes, high blood pressure, high cholesterol, smoker, or strong family history of heart disease)     no 8. PULMONARY RISK FACTORS: "Do you have any history of lung disease?"  (e.g., blood clots in lung, asthma,  emphysema, birth control pills)     no 9. CAUSE: "What do you think is causing the chest pain?"     unknown 10. OTHER SYMPTOMS: "Do you have any other symptoms?" (e.g., dizziness, nausea, vomiting, sweating, fever, difficulty breathing, cough)       none 11. PREGNANCY: "Is there any chance you are pregnant?" "When was your last menstrual period?"       na  Protocols used: CHEST PAIN-A-AH

## 2020-02-14 ENCOUNTER — Ambulatory Visit: Payer: No Typology Code available for payment source | Attending: Nurse Practitioner | Admitting: Nurse Practitioner

## 2020-02-14 ENCOUNTER — Encounter: Payer: Self-pay | Admitting: Nurse Practitioner

## 2020-02-14 ENCOUNTER — Other Ambulatory Visit: Payer: Self-pay

## 2020-02-14 ENCOUNTER — Other Ambulatory Visit (HOSPITAL_COMMUNITY)
Admission: RE | Admit: 2020-02-14 | Discharge: 2020-02-14 | Disposition: A | Discharge status: Home / Self Care | Payer: No Typology Code available for payment source | Source: Ambulatory Visit | Attending: Nurse Practitioner | Admitting: Nurse Practitioner

## 2020-02-14 VITALS — BP 108/72 | HR 85 | Temp 97.7°F | Ht 69.0 in | Wt 128.0 lb

## 2020-02-14 DIAGNOSIS — Z124 Encounter for screening for malignant neoplasm of cervix: Secondary | ICD-10-CM | POA: Diagnosis present

## 2020-02-14 NOTE — Progress Notes (Signed)
Assessment & Plan:  Tasha Nguyen was seen today for gynecologic exam.  Diagnoses and all orders for this visit:  Encounter for Papanicolaou smear for cervical cancer screening -     Cytology - PAP -     Cervicovaginal ancillary only    Patient has been counseled on age-appropriate routine health concerns for screening and prevention. These are reviewed and up-to-date. Referrals have been placed accordingly. Immunizations are up-to-date or declined.    Subjective:   Chief Complaint  Patient presents with  . Gynecologic Exam    Pt. is here for pap smear exam.    HPI Tasha Nguyen 64 y.o. female presents to office today for PAP SMEAR. She has no questions or concerns today.   Review of Systems  Constitutional: Negative.  Negative for chills, fever, malaise/fatigue and weight loss.  Respiratory: Negative.  Negative for cough, shortness of breath and wheezing.   Cardiovascular: Negative.  Negative for chest pain, orthopnea and leg swelling.  Gastrointestinal: Negative for abdominal pain.  Genitourinary: Negative.  Negative for flank pain.  Skin: Negative.  Negative for rash.  Psychiatric/Behavioral: Negative for suicidal ideas.    Past Medical History:  Diagnosis Date  . Colon polyps   . Hyperlipidemia   . Hypertension     Past Surgical History:  Procedure Laterality Date  . none      Family History  Problem Relation Age of Onset  . CAD Father   . CAD Sister   . Diabetes Sister   . Diabetes Mother   . Diabetes Sister   . Colon cancer Neg Hx   . Esophageal cancer Neg Hx   . Stomach cancer Neg Hx   . Pancreatic cancer Neg Hx   . Liver disease Neg Hx     Social History Reviewed with no changes to be made today.   No outpatient medications prior to visit.   No facility-administered medications prior to visit.    Allergies  Allergen Reactions  . Hydrocodone-Acetaminophen     Other reaction(s): Other (See Comments) unknown  . Oxycodone Other (See Comments)      Hallucinations Other reaction(s): Other (See Comments) Hallucinations  . Oxycodone-Acetaminophen     Other reaction(s): Other (See Comments) unknown       Objective:    BP 108/72 (BP Location: Left Arm, Patient Position: Sitting, Cuff Size: Normal)   Pulse 85   Temp 97.7 F (36.5 C) (Temporal)   Ht 5\' 9"  (1.753 m)   Wt 128 lb (58.1 kg)   SpO2 97%   BMI 18.90 kg/m  Wt Readings from Last 3 Encounters:  02/14/20 128 lb (58.1 kg)  02/01/20 130 lb 12.8 oz (59.3 kg)  11/23/19 130 lb (59 kg)    Physical Exam Exam conducted with a chaperone present.  Constitutional:      Appearance: She is well-developed.  HENT:     Head: Normocephalic.  Cardiovascular:     Rate and Rhythm: Normal rate and regular rhythm.     Heart sounds: Normal heart sounds.  Pulmonary:     Effort: Pulmonary effort is normal.     Breath sounds: Normal breath sounds.  Abdominal:     General: Bowel sounds are normal.     Palpations: Abdomen is soft.     Hernia: There is no hernia in the left inguinal area.  Genitourinary:    Exam position: Lithotomy position.     Labia:        Right: No rash, tenderness, lesion  or injury.        Left: No rash, tenderness, lesion or injury.      Vagina: Normal. No signs of injury and foreign body. No vaginal discharge, erythema, tenderness or bleeding.     Cervix: No cervical motion tenderness or friability.     Uterus: Not deviated and not enlarged.      Adnexa:        Right: No mass, tenderness or fullness.         Left: No mass, tenderness or fullness.       Rectum: Normal. No external hemorrhoid.  Lymphadenopathy:     Lower Body: No right inguinal adenopathy. No left inguinal adenopathy.  Skin:    General: Skin is warm and dry.  Neurological:     Mental Status: She is alert and oriented to person, place, and time.  Psychiatric:        Behavior: Behavior normal.        Thought Content: Thought content normal.        Judgment: Judgment normal.           Patient has been counseled extensively about nutrition and exercise as well as the importance of adherence with medications and regular follow-up. The patient was given clear instructions to go to ER or return to medical center if symptoms don't improve, worsen or new problems develop. The patient verbalized understanding.   Follow-up: Return if symptoms worsen or fail to improve, for with Dr. Joya Gaskins as instructed.   Gildardo Pounds, FNP-BC [facility] and [place], [place]   02/14/2020, 3:32 PM

## 2020-02-15 ENCOUNTER — Ambulatory Visit (INDEPENDENT_AMBULATORY_CARE_PROVIDER_SITE_OTHER): Payer: No Typology Code available for payment source | Admitting: Podiatry

## 2020-02-15 DIAGNOSIS — M79674 Pain in right toe(s): Secondary | ICD-10-CM | POA: Diagnosis not present

## 2020-02-15 DIAGNOSIS — B351 Tinea unguium: Secondary | ICD-10-CM

## 2020-02-15 DIAGNOSIS — M79675 Pain in left toe(s): Secondary | ICD-10-CM | POA: Diagnosis not present

## 2020-02-16 LAB — CERVICOVAGINAL ANCILLARY ONLY
Bacterial Vaginitis (gardnerella): POSITIVE — AB
Candida Glabrata: NEGATIVE
Candida Vaginitis: NEGATIVE
Chlamydia: NEGATIVE
Comment: NEGATIVE
Comment: NEGATIVE
Comment: NEGATIVE
Comment: NEGATIVE
Comment: NEGATIVE
Comment: NORMAL
Neisseria Gonorrhea: NEGATIVE
Trichomonas: NEGATIVE

## 2020-02-18 ENCOUNTER — Encounter: Payer: Self-pay | Admitting: Podiatry

## 2020-02-18 NOTE — Progress Notes (Signed)
  Subjective:  Patient ID: Tasha Nguyen, female    DOB: 09-Dec-1955,  MRN: [id number]  Chief Complaint  Patient presents with  . routine foot care    Nail/callous trim     64 y.o. female presents with the above complaint. History confirmed with patient.   Objective:  Physical Exam: warm, good capillary refill, no trophic changes or ulcerative lesions, normal DP and PT pulses and normal sensory exam.onychomycosis x10  Assessment:  No diagnosis found.   Plan:  Patient was evaluated and treated and all questions answered.  Discussed the etiology and treatment options for the condition in detail with the patient. Educated patient on the topical and oral treatment options for mycotic nails. Recommended debridement of the nails today. Sharp and mechanical debridement performed of all painful and mycotic nails today. Nails debrided in length and thickness using a nail nipper and a mechanical burr to level of comfort. Discussed treatment options including appropriate shoe gear. Follow up as needed for painful nails.   Return in about 3 months (around 05/17/2020), or if symptoms worsen or fail to improve.

## 2020-02-19 LAB — CYTOLOGY - PAP
Comment: NEGATIVE
Comment: NEGATIVE
Diagnosis: NEGATIVE
HPV 16: NEGATIVE
HPV 18 / 45: NEGATIVE
High risk HPV: POSITIVE — AB

## 2020-02-28 ENCOUNTER — Other Ambulatory Visit: Payer: Self-pay | Admitting: Nurse Practitioner

## 2020-02-28 ENCOUNTER — Telehealth: Payer: Self-pay | Admitting: Critical Care Medicine

## 2020-02-28 MED ORDER — METRONIDAZOLE 500 MG PO TABS
500.0000 mg | ORAL_TABLET | Freq: Two times a day (BID) | ORAL | 0 refills | Status: AC
Start: 2020-02-28 — End: 2020-03-06

## 2020-02-28 NOTE — Telephone Encounter (Signed)
Copied from [place] [id number]. Topic: Quick Communication - See Telephone Encounter >> Feb 28, 2020  8:18 AM Loma Boston wrote: CRM for notification. See Telephone encounter for: 02/28/20.  Please call pt back, Dr Raul Del did a PAP for PT this week, Pt states a antibiotic is to be called in as infection, (not at pharmacy yet) main concern is she will be on medication/ a infection, should she cancel her colonoscopy for Dec 3? Would still be on med. Pls FU let her know as if resch needs to notify FU [phone]

## 2020-02-28 NOTE — Telephone Encounter (Signed)
Rx was sent to the Pharmacy. Spoke to patient and informed she does not need to cancel her colonoscopy procedure.

## 2020-02-28 NOTE — Telephone Encounter (Signed)
Called pt unable to reach left VM. 

## 2020-02-28 NOTE — Telephone Encounter (Signed)
Pt returning call. Please advise. °

## 2020-03-08 ENCOUNTER — Encounter: Payer: Self-pay | Admitting: Gastroenterology

## 2020-03-08 ENCOUNTER — Other Ambulatory Visit: Payer: Self-pay

## 2020-03-08 ENCOUNTER — Other Ambulatory Visit: Payer: Self-pay | Admitting: Gastroenterology

## 2020-03-08 ENCOUNTER — Ambulatory Visit (AMBULATORY_SURGERY_CENTER): Payer: No Typology Code available for payment source | Admitting: Gastroenterology

## 2020-03-08 VITALS — BP 162/89 | HR 59 | Temp 98.9°F | Resp 18 | Ht 69.0 in | Wt 128.0 lb

## 2020-03-08 DIAGNOSIS — D122 Benign neoplasm of ascending colon: Secondary | ICD-10-CM

## 2020-03-08 DIAGNOSIS — D125 Benign neoplasm of sigmoid colon: Secondary | ICD-10-CM

## 2020-03-08 DIAGNOSIS — Z86010 Personal history of colonic polyps: Secondary | ICD-10-CM

## 2020-03-08 MED ORDER — SODIUM CHLORIDE 0.9 % IV SOLN: 500.0000 mL | INTRAVENOUS | Status: DC

## 2020-03-08 NOTE — Patient Instructions (Signed)
Handout given:  Polyps Resume previous diet Continue current medications Await pathology results  YOU HAD AN ENDOSCOPIC PROCEDURE TODAY AT THE [facility]:   Refer to the procedure report that was given to you for any specific questions about what was found during the examination.  If the procedure report does not answer your questions, please call your gastroenterologist to clarify.  If you requested that your care partner not be given the details of your procedure findings, then the procedure report has been included in a sealed envelope for you to review at your convenience later.  YOU SHOULD EXPECT: Some feelings of bloating in the abdomen. Passage of more gas than usual.  Walking can help get rid of the air that was put into your GI tract during the procedure and reduce the bloating. If you had a lower endoscopy (such as a colonoscopy or flexible sigmoidoscopy) you may notice spotting of blood in your stool or on the toilet paper. If you underwent a bowel prep for your procedure, you may not have a normal bowel movement for a few days.  Please Note:  You might notice some irritation and congestion in your nose or some drainage.  This is from the oxygen used during your procedure.  There is no need for concern and it should clear up in a day or so.  SYMPTOMS TO REPORT IMMEDIATELY:   Following lower endoscopy (colonoscopy or flexible sigmoidoscopy):  Excessive amounts of blood in the stool  Significant tenderness or worsening of abdominal pains  Swelling of the abdomen that is new, acute  Fever of 100F or higher  For urgent or emergent issues, a gastroenterologist can be reached at any hour by calling [phone]. Do not use MyChart messaging for urgent concerns.    DIET:  We do recommend a small meal at first, but then you may proceed to your regular diet.  Drink plenty of fluids but you should avoid alcoholic beverages for 24 hours.  ACTIVITY:  You should plan to take  it easy for the rest of today and you should NOT DRIVE or use heavy machinery until tomorrow (because of the sedation medicines used during the test).    FOLLOW UP: Our staff will call the number listed on your records 48-72 hours following your procedure to check on you and address any questions or concerns that you may have regarding the information given to you following your procedure. If we do not reach you, we will leave a message.  We will attempt to reach you two times.  During this call, we will ask if you have developed any symptoms of COVID 19. If you develop any symptoms (ie: fever, flu-like symptoms, shortness of breath, cough etc.) before then, please call [phone].  If you test positive for Covid 19 in the 2 weeks post procedure, please call and report this information to us.    If any biopsies were taken you will be contacted by phone or by letter within the next 1-3 weeks.  Please call us at [phone] if you have not heard about the biopsies in 3 weeks.   SIGNATURES/CONFIDENTIALITY: You and/or your care partner have signed paperwork which will be entered into your electronic medical record.  These signatures attest to the fact that that the information above on your After Visit Summary has been reviewed and is understood.  Full responsibility of the confidentiality of this discharge information lies with you and/or your care-partner. 

## 2020-03-08 NOTE — Progress Notes (Signed)
Called to room to assist during endoscopic procedure.  Patient ID and intended procedure confirmed with present staff. Received instructions for my participation in the procedure from the performing physician.  

## 2020-03-08 NOTE — Progress Notes (Signed)
Reviewed medical/surgical noted any changes since office visit.

## 2020-03-08 NOTE — Op Note (Signed)
[facility] Patient Name: Tasha Nguyen Procedure Date: 03/08/2020 3:13 PM MRN: [id number] Endoscopist: Remo Lipps P. Havery Moros , MD Age: 64 Referring MD:  Date of Birth: [birth year] Gender: Female Account #: [id number] Procedure:                Colonoscopy Indications:              High risk colon cancer surveillance: Personal                            history of colonic polyps (reported history of                            colon polyps on colonoscopy 9 years ago) Medicines:                Monitored Anesthesia Care Procedure:                Pre-Anesthesia Assessment:                           - Prior to the procedure, a History and Physical                            was performed, and patient medications and                            allergies were reviewed. The patient's tolerance of                            previous anesthesia was also reviewed. The risks                            and benefits of the procedure and the sedation                            options and risks were discussed with the patient.                            All questions were answered, and informed consent                            was obtained. Prior Anticoagulants: The patient has                            taken no previous anticoagulant or antiplatelet                            agents. ASA Grade Assessment: II - A patient with                            mild systemic disease. After reviewing the risks                            and benefits, the patient was deemed in  satisfactory condition to undergo the procedure.                           After obtaining informed consent, the colonoscope                            was passed under direct vision. Throughout the                            procedure, the patient's blood pressure, pulse, and                            oxygen saturations were monitored continuously. The                            Colonoscope was  introduced through the anus and                            advanced to the the cecum, identified by                            appendiceal orifice and ileocecal valve. The                            colonoscopy was performed without difficulty. The                            patient tolerated the procedure well. The quality                            of the bowel preparation was adequate. The                            ileocecal valve, appendiceal orifice, and rectum                            were photographed. Scope In: 3:30:54 PM Scope Out: 3:55:05 PM Scope Withdrawal Time: 0 hours 19 minutes 48 seconds  Total Procedure Duration: 0 hours 24 minutes 11 seconds  Findings:                 The perianal and digital rectal examinations were                            normal.                           A 3 mm polyp was found in the ascending colon. The                            polyp was sessile. The polyp was removed with a                            cold snare. Resection and retrieval were complete.  Two sessile polyps were found in the sigmoid colon.                            The polyps were 3 mm in size. These polyps were                            removed with a cold snare. Resection and retrieval                            were complete.                           The colon was tortuous with poor air retention in                            the left colon                           Internal hemorrhoids were found during retroflexion.                           The exam was otherwise without abnormality. Complications:            No immediate complications. Estimated blood loss:                            Minimal. Estimated Blood Loss:     Estimated blood loss was minimal. Impression:               - One 3 mm polyp in the ascending colon, removed                            with a cold snare. Resected and retrieved.                           - Two 3 mm polyps in  the sigmoid colon, removed                            with a cold snare. Resected and retrieved.                           - Tortuous colon.                           - Internal hemorrhoids.                           - The examination was otherwise normal. Recommendation:           - Patient has a contact number available for                            emergencies. The signs and symptoms of potential                            delayed complications were  discussed with the                            patient. Return to normal activities tomorrow.                            Written discharge instructions were provided to the                            patient.                           - Resume previous diet.                           - Continue present medications.                           - Await pathology results. Remo Lipps P. Maalle Starrett, MD 03/08/2020 3:58:41 PM This report has been signed electronically.

## 2020-03-08 NOTE — Progress Notes (Signed)
PT taken to PACU. Monitors in place. VSS. Report given to RN. 

## 2020-03-12 ENCOUNTER — Telehealth: Payer: Self-pay | Admitting: *Deleted

## 2020-03-12 ENCOUNTER — Telehealth: Payer: Self-pay

## 2020-03-12 NOTE — Telephone Encounter (Signed)
Left message on follow up call. 

## 2020-03-12 NOTE — Telephone Encounter (Signed)
  Follow up Call-  Call back number 03/08/2020  Post procedure Call Back phone  # [phone]  Permission to leave phone message Yes  Some recent data might be hidden     Patient questions:  Do you have a fever, pain , or abdominal swelling? No. Pain Score  0 *  Have you tolerated food without any problems? Yes.    Have you been able to return to your normal activities? Yes.    Do you have any questions about your discharge instructions: Diet   No. Medications  No. Follow up visit  No.  Do you have questions or concerns about your Care? No.  Actions: * If pain score is 4 or above: No action needed, pain <4.  1. Have you developed a fever since your procedure? no  2.   Have you had an respiratory symptoms (SOB or cough) since your procedure? no  3.   Have you tested positive for COVID 19 since your procedure no  4.   Have you had any family members/close contacts diagnosed with the COVID 19 since your procedure?  no   If yes to any of these questions please route to Joylene John, RN and Joella Prince, RN

## 2020-03-19 ENCOUNTER — Encounter: Payer: Self-pay | Admitting: Gastroenterology

## 2020-03-26 ENCOUNTER — Ambulatory Visit
Payer: No Typology Code available for payment source | Attending: Critical Care Medicine | Admitting: Critical Care Medicine

## 2020-03-26 ENCOUNTER — Other Ambulatory Visit: Payer: Self-pay

## 2020-03-26 NOTE — Progress Notes (Deleted)
Subjective:    Patient ID: Tasha Nguyen, female    DOB: Oct 17, 1955, 64 y.o.   MRN: [id number]  63 y.o.F here to establish pcp.  This patient has not been seen by physician in over 10 years and has a history of hypertension and hyperlipidemia  Patient comes in today off medications no known prior history of diabetes except for positive family history.  She does need an eye exam.  Patient also needs a mammogram and labs obtained including complete metabolic panel blood count and lipid panel.  She complains of pain in the left foot from a callus on the plantar aspect of the foot.  She also is due up a colonoscopy.  And Pap smear is also needed.  Patient states no real headaches.  She denies any chest pain.  Blood pressure has been ranging up up.  The patient also drinks about 18 beers in 2 to 3 days.  She does not smoke cigarettes at this time.  She has no other systemic complaints at this time  Note the patient has received her Covid vaccine and this was documented.  The patient is doing hepatitis C and HIV study  11/23/2019 The patient returns today doing well except she complains of insomnia and symptoms referable to her PTSD.  Patient's not able to sleep fully through the night however she goes to bed at 7 PM and watches TV right before she goes to sleep.  Patient is still using some alcohol trying to quit.  The patient is now on atorvastatin for mixed hyperlipidemia.  Patient maintains amlodipine 5 mg a day and doing well with this blood pressure is excellent on arrival 118/82  Patient is pending her colonoscopy there are no other complaints  03/26/2020  Essential hypertension Hypertension well controlled at this time we will continue amlodipine daily  Alcohol use Ongoing alcohol use of advised continued work to be abstaining and will send to psychiatry for generalized anxiety disorder and posttraumatic stress  Generalized anxiety disorder Psychiatry referral  given  Psychophysiological insomnia I gave patient measures to improve her sleep hygiene  PTSD (post-traumatic stress disorder) As per generalized anxiety will refer to psychiatry   Tasha Nguyen was seen today for follow-up.  Diagnoses and all orders for this visit:  PTSD (post-traumatic stress disorder) -     Ambulatory referral to Psychiatry  Generalized anxiety disorder -     Ambulatory referral to Psychiatry  Psychophysiological insomnia -     Ambulatory referral to Psychiatry  Essential hypertension  Alcohol use  Other orders -     atorvastatin (LIPITOR) 10 MG tablet; Take 1 tablet (10 mg total) by mouth daily.      Past Medical History:  Diagnosis Date  . Colon polyps      Family History  Problem Relation Age of Onset  . CAD Father   . CAD Sister   . Diabetes Sister   . Diabetes Mother   . Diabetes Sister   . Colon cancer Neg Hx   . Esophageal cancer Neg Hx   . Stomach cancer Neg Hx   . Pancreatic cancer Neg Hx   . Liver disease Neg Hx   . Rectal cancer Neg Hx      Social History   Socioeconomic History  . Marital status: Single    Spouse name: Not on file  . Number of children: Not on file  . Years of education: Not on file  . Highest education level: Not on file  Occupational  History  . Not on file  Tobacco Use  . Smoking status: Never Smoker  . Smokeless tobacco: Never Used  Vaping Use  . Vaping Use: Never used  Substance and Sexual Activity  . Alcohol use: Yes    Comment: weekends-4-5 beers  . Drug use: Yes    Types: Marijuana    Comment: occ  . Sexual activity: Yes    Birth control/protection: None  Other Topics Concern  . Not on file  Social History Narrative  . Not on file   Social Determinants of Health   Financial Resource Strain: Not on file  Food Insecurity: Not on file  Transportation Needs: Not on file  Physical Activity: Not on file  Stress: Not on file  Social Connections: Not on file  Intimate Partner Violence:  Not on file     Allergies  Allergen Reactions  . Hydrocodone-Acetaminophen     Other reaction(s): Other (See Comments) unknown  . Oxycodone Other (See Comments)    Hallucinations Other reaction(s): Other (See Comments) Hallucinations  . Oxycodone-Acetaminophen     Other reaction(s): Other (See Comments) unknown     No outpatient medications prior to visit.   No facility-administered medications prior to visit.     Review of Systems  Constitutional: Negative.   HENT: Negative.   Eyes: Positive for visual disturbance. Negative for pain, discharge, redness and itching.  Respiratory: Positive for cough. Negative for choking, chest tightness, shortness of breath, wheezing and stridor.   Cardiovascular: Negative for chest pain, palpitations and leg swelling.  Gastrointestinal: Negative.   Endocrine: Negative for cold intolerance, heat intolerance and polyuria.  Genitourinary: Negative.   Musculoskeletal: Negative.   Skin: Negative.   Allergic/Immunologic: Negative.   Neurological: Negative.  Negative for seizures.  Hematological: Negative for adenopathy. Bruises/bleeds easily.  Psychiatric/Behavioral: Negative.        Objective:   Physical Exam There were no vitals filed for this visit.  Gen: Pleasant, thin, in no distress,  normal affect  ENT: No lesions,  mouth clear,  oropharynx clear, no postnasal drip  Neck: No JVD, no TMG, no carotid bruits  Lungs: No use of accessory muscles, no dullness to percussion, clear without rales or rhonchi  Cardiovascular: RRR, heart sounds normal, no murmur or gallops, no peripheral edema  Abdomen: soft and NT, no HSM,  BS normal  Musculoskeletal: No deformities, no cyanosis or clubbing  Neuro: alert, non focal  Skin: Warm, no lesions or rashes        Assessment & Plan:  I personally reviewed all images and lab data in the [facility] system as well as any outside material available during this office visit and agree with the   radiology impressions.   No problem-specific Assessment & Plan notes found for this encounter.   There are no diagnoses linked to this encounter.

## 2020-06-28 ENCOUNTER — Ambulatory Visit (INDEPENDENT_AMBULATORY_CARE_PROVIDER_SITE_OTHER): Payer: BC Managed Care – PPO | Admitting: Podiatry

## 2020-06-28 ENCOUNTER — Other Ambulatory Visit: Payer: Self-pay

## 2020-06-28 ENCOUNTER — Ambulatory Visit (INDEPENDENT_AMBULATORY_CARE_PROVIDER_SITE_OTHER): Payer: BC Managed Care – PPO

## 2020-06-28 ENCOUNTER — Encounter: Payer: Self-pay | Admitting: Podiatry

## 2020-06-28 DIAGNOSIS — M2042 Other hammer toe(s) (acquired), left foot: Secondary | ICD-10-CM

## 2020-07-02 ENCOUNTER — Encounter: Payer: Self-pay | Admitting: Podiatry

## 2020-07-02 NOTE — Progress Notes (Signed)
Subjective:  Patient ID: Tasha Nguyen, female    DOB: 08-20-1955,  MRN: [id number]  Chief Complaint  Patient presents with  . Nail Problem    Nail trim     65 y.o. female presents with the above complaint.  Patient presents with follow-up of continuous left fifth digit hammertoe pain.  Patient states that she has tried different shoes has not helped.  She is tried various different conservative treatment options none of which has helped.  She would like to discuss surgical options at this time.   Review of Systems: Negative except as noted in the HPI. Denies N/V/F/Ch.  Past Medical History:  Diagnosis Date  . Colon polyps    No current outpatient medications on file.  Social History   Tobacco Use  Smoking Status Never Smoker  Smokeless Tobacco Never Used    Allergies  Allergen Reactions  . Hydrocodone-Acetaminophen     Other reaction(s): Other (See Comments) unknown  . Oxycodone Other (See Comments)    Hallucinations Other reaction(s): Other (See Comments) Hallucinations  . Oxycodone-Acetaminophen     Other reaction(s): Other (See Comments) unknown   Objective:  There were no vitals filed for this visit. There is no height or weight on file to calculate BMI. Constitutional Well developed. Well nourished.  Vascular Dorsalis pedis pulses palpable bilaterally. Posterior tibial pulses palpable bilaterally. Capillary refill normal to all digits.  No cyanosis or clubbing noted. Pedal hair growth normal.  Neurologic Normal speech. Oriented to person, place, and time. Epicritic sensation to light touch grossly present bilaterally.  Dermatologic  hyperkeratotic lesion noted to the lateral side of the fifth digit.  No pinpoint bleeding no central nucleated core  Orthopedic: Normal joint ROM without pain or crepitus bilaterally. 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  Pain on palpation to the fifth digit. No bony tenderness.    Radiographs: None Assessment:   1. Hammer toe of left foot    Plan:  Patient was evaluated and treated and all questions answered.  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  She has failed conservative treatment options including padding protecting offloading shoe gear modification.  She states is extremely painful and she would like to discuss surgical options at this time.  I discussed my surgical plan which includes correction of the fifth digit hammertoe with derotational arthroplasty.  Patient states understanding and would like to proceed with the surgery.  I discussed with the patient in extensive detail my surgical incision placement as well as my surgical goals.  Patient states understanding -Informed surgical risk consent was reviewed and read aloud to the patient.  I reviewed the films.  I have discussed my findings with the patient in great detail.  I have discussed all risks including but not limited to infection, stiffness, scarring, limp, disability, deformity, damage to blood vessels and nerves, numbness, poor healing, need for braces, arthritis, chronic pain, amputation, death.  All benefits and realistic expectations discussed in great detail.  I have made no promises as to the outcome.  I have provided realistic expectations.  I have offered the patient a 2nd opinion, which they have declined and assured me they preferred to proceed despite the risks -A total of 33 minutes was spent in direct patient care as well as pre and post patient encounter activities.  This includes documentation as well as reviewing  patient chart for labs, imaging, past medical, surgical, social, and family history as documented in the EMR.  I have reviewed medication allergies as documented in EMR.  I discussed the etiology of condition  and treatment options from conservative to surgical care.  All risks and benefit of the treatment course was discussed in detail.  All questions were answered and return appointment was discussed.  Since the visit completed in an ambulatory/outpatient setting, the patient and/or parent/guardian has been advised to contact the providers office for worsening condition and seek medical treatment and/or call 911 if the patient deems either is necessary.   No follow-ups on file.

## 2020-07-08 DIAGNOSIS — M79676 Pain in unspecified toe(s): Secondary | ICD-10-CM

## 2020-07-15 ENCOUNTER — Telehealth: Payer: Self-pay | Admitting: Urology

## 2020-07-15 NOTE — Telephone Encounter (Signed)
DOS - 08/05/20  HAMMERTOE REPAIR 5TH LEFT --- [id number]  [employer] EFFECTIVE DATE - 03/19/20    PLAN DEDUCTIBLE - $3,000.00 W/ $3,000.00 REMAINING OUT OF POCKET - $5,000.00 W/ $5,000.00 REMAINING COINSURANCE - 0% COPAY - $0.00    SPOKE WITH GLORIA WITH [employer] AND SHE STATED THAT CPT CODE 51102 HAS BEEN APPROVED AND IS GOOD FROM 08/05/20 - 6/30-22.  REF# [id number]

## 2020-07-24 ENCOUNTER — Telehealth: Payer: Self-pay

## 2020-07-24 NOTE — Telephone Encounter (Signed)
Tasha Nguyen called to cancel her surgery with Dr. Posey Pronto on 08/05/2020. She stated [name] called her about her $3000.00 deductible and she doesn't have that to pay right now. Notified Dr. Posey Pronto and Orpah Greek [place]

## 2020-08-14 ENCOUNTER — Encounter: Payer: BC Managed Care – PPO | Admitting: Podiatry

## 2020-08-28 ENCOUNTER — Encounter: Payer: BC Managed Care – PPO | Admitting: Podiatry

## 2020-12-02 ENCOUNTER — Telehealth: Payer: Self-pay | Admitting: Critical Care Medicine

## 2020-12-02 NOTE — Telephone Encounter (Signed)
-----   Message from Elsie Stain, MD sent at 11/29/2020  1:24 PM EDT ----- This pt needs ov with me in next 3 weeks ok to overbook

## 2020-12-02 NOTE — Telephone Encounter (Signed)
Called patient to get scheduled with Dr. Joya Gaskins in 3 weeks. Left vm to call [phone]

## 2020-12-17 ENCOUNTER — Ambulatory Visit: Payer: Self-pay | Attending: Critical Care Medicine | Admitting: Critical Care Medicine

## 2020-12-17 ENCOUNTER — Encounter: Payer: Self-pay | Admitting: Critical Care Medicine

## 2020-12-17 ENCOUNTER — Other Ambulatory Visit: Payer: Self-pay

## 2020-12-17 VITALS — BP 117/76 | HR 71 | Ht 69.0 in | Wt 132.4 lb

## 2020-12-17 DIAGNOSIS — Z23 Encounter for immunization: Secondary | ICD-10-CM

## 2020-12-17 DIAGNOSIS — Z789 Other specified health status: Secondary | ICD-10-CM

## 2020-12-17 DIAGNOSIS — E782 Mixed hyperlipidemia: Secondary | ICD-10-CM

## 2020-12-17 DIAGNOSIS — I10 Essential (primary) hypertension: Secondary | ICD-10-CM

## 2020-12-17 DIAGNOSIS — Z7289 Other problems related to lifestyle: Secondary | ICD-10-CM

## 2020-12-17 DIAGNOSIS — Z139 Encounter for screening, unspecified: Secondary | ICD-10-CM

## 2020-12-17 DIAGNOSIS — Z1231 Encounter for screening mammogram for malignant neoplasm of breast: Secondary | ICD-10-CM

## 2020-12-17 NOTE — Progress Notes (Signed)
Established Patient Office Visit  Subjective:  Patient ID: Tasha Nguyen, female    DOB: Jul 14, 1955  Age: 65 y.o. MRN: [id number]  CC:  Chief Complaint  Patient presents with   Annual Exam    HPI Tasha Nguyen presents for primary care follow-up visit.  Patient's not been seen since August 2021.  She still drinks 3 beers daily.  She now has [employer].  She needs a follow-up mammogram and pelvic exam.  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  Patient does have hypertension in the past but she is now off amlodipine and on arrival blood pressure is good at 117/76.  Her weight is stable.  She does agree to a flu vaccine at this visit.  She never access psychiatry resources she states her anxiety is stable at this time  Past Medical History:  Diagnosis Date   Colon polyps     Past Surgical History:  Procedure Laterality Date   none      Family History  Problem Relation Age of Onset   CAD Father    CAD Sister    Diabetes Sister    Diabetes Mother    Diabetes Sister    Colon cancer Neg Hx    Esophageal cancer Neg Hx    Stomach cancer Neg Hx    Pancreatic cancer Neg Hx    Liver disease Neg Hx    Rectal cancer Neg Hx     Social History   Socioeconomic History   Marital status: Single    Spouse name: Not on file   Number of children: Not on file   Years of education: Not on file   Highest education level: Not on file  Occupational History   Not on file  Tobacco Use   Smoking status: Never   Smokeless tobacco: Never  Vaping Use   Vaping Use: Never used  Substance and Sexual Activity   Alcohol use: Yes    Comment: weekends-4-5 beers   Drug use: Yes    Types: Marijuana    Comment: occ   Sexual activity: Yes    Birth control/protection: None  Other Topics Concern   Not on file  Social History Narrative   Not on file   Social Determinants of  Health   Financial Resource Strain: Not on file  Food Insecurity: Not on file  Transportation Needs: Not on file  Physical Activity: Not on file  Stress: Not on file  Social Connections: Not on file  Intimate Partner Violence: Not on file    No outpatient medications prior to visit.   No facility-administered medications prior to visit.    Allergies  Allergen Reactions   Hydrocodone-Acetaminophen     Other reaction(s): Other (See Comments) unknown   Oxycodone Other (See Comments)    Hallucinations Other reaction(s): Other (See Comments) Hallucinations   Oxycodone-Acetaminophen     Other reaction(s): Other (See Comments) unknown    ROS Review of Systems  Constitutional:  Negative for chills, diaphoresis and fever.  HENT:  Negative for congestion, ear discharge, ear pain, hearing loss, nosebleeds, sore throat and tinnitus.   Eyes:  Negative for photophobia and discharge.  Respiratory:  Negative for cough, shortness of breath, wheezing and stridor.        No excess mucus  Cardiovascular:  Negative for chest pain, palpitations and leg swelling.  Gastrointestinal:  Negative for abdominal pain, blood in stool, constipation, diarrhea, nausea and vomiting.  Endocrine: Negative for polydipsia.  Genitourinary:  Negative for dysuria, flank pain, frequency, hematuria and urgency.  Musculoskeletal:  Negative for back pain, myalgias and neck pain.  Skin:  Negative for rash.  Allergic/Immunologic: Negative for environmental allergies.  Neurological:  Negative for dizziness, tremors, seizures, weakness and headaches.  Hematological:  Does not bruise/bleed easily.  Psychiatric/Behavioral:  Negative for hallucinations and suicidal ideas. The patient is not nervous/anxious.   All other systems reviewed and are negative.    Objective:    Physical Exam Vitals reviewed.  Constitutional:      Appearance: Normal appearance. She is well-developed and normal weight. She is not  diaphoretic.  HENT:     Head: Normocephalic and atraumatic.     Nose: Nose normal. No nasal deformity, septal deviation, mucosal edema or rhinorrhea.     Right Sinus: No maxillary sinus tenderness or frontal sinus tenderness.     Left Sinus: No maxillary sinus tenderness or frontal sinus tenderness.     Mouth/Throat:     Mouth: Mucous membranes are moist.     Pharynx: Oropharynx is clear. No oropharyngeal exudate.     Comments: Edentulous, has dentures Eyes:     General: No scleral icterus.    Conjunctiva/sclera: Conjunctivae normal.     Pupils: Pupils are equal, round, and reactive to light.  Neck:     Thyroid: No thyromegaly.     Vascular: No carotid bruit or JVD.     Trachea: Trachea normal. No tracheal tenderness or tracheal deviation.  Cardiovascular:     Rate and Rhythm: Normal rate and regular rhythm.     Chest Wall: PMI is not displaced.     Pulses: Normal pulses. No decreased pulses.     Heart sounds: Normal heart sounds, S1 normal and S2 normal. Heart sounds not distant. No murmur heard. No systolic murmur is present.  No diastolic murmur is present.    No friction rub. No gallop. No S3 or S4 sounds.  Pulmonary:     Effort: No tachypnea, accessory muscle usage or respiratory distress.     Breath sounds: No stridor. No decreased breath sounds, wheezing, rhonchi or rales.  Chest:     Chest wall: No tenderness.  Abdominal:     General: Bowel sounds are normal. There is no distension.     Palpations: Abdomen is soft. Abdomen is not rigid.     Tenderness: There is no abdominal tenderness. There is no guarding or rebound.  Musculoskeletal:        General: Normal range of motion.     Cervical back: Normal range of motion and neck supple. No edema, erythema or rigidity. No muscular tenderness. Normal range of motion.  Lymphadenopathy:     Head:     Right side of head: No submental or submandibular adenopathy.     Left side of head: No submental or submandibular adenopathy.      Cervical: No cervical adenopathy.  Skin:    General: Skin is warm and dry.     Coloration: Skin is not pale.     Findings: No rash.     Nails: There is no clubbing.  Neurological:     General: No focal deficit present.     Mental Status: She is alert and oriented to person, place, and time. Mental status is at baseline.     Sensory: No sensory deficit.  Psychiatric:  Mood and Affect: Mood normal.        Speech: Speech normal.        Behavior: Behavior normal.        Thought Content: Thought content normal.        Judgment: Judgment normal.    BP 117/76   Pulse 71   Ht '5\' 9"'$  (1.753 m)   Wt 132 lb 6.4 oz (60.1 kg)   SpO2 99%   BMI 19.55 kg/m  Wt Readings from Last 3 Encounters:  12/17/20 132 lb 6.4 oz (60.1 kg)  03/08/20 128 lb (58.1 kg)  02/14/20 128 lb (58.1 kg)     Health Maintenance Due  Topic Date Due   Zoster Vaccines- Shingrix (1 of 2) Never done   COVID-19 Vaccine (3 - Booster for Pfizer series) 02/07/2020    There are no preventive care reminders to display for this patient.   Lab Results  Component Value Date   WBC 5.0 10/23/2019   HGB 13.9 10/23/2019   HCT 42.2 10/23/2019   MCV 98 (H) 10/23/2019   PLT 346 10/23/2019   Lab Results  Component Value Date   NA 142 10/23/2019   K 4.5 10/23/2019   CO2 20 10/23/2019   GLUCOSE 88 10/23/2019   BUN 15 10/23/2019   CREATININE 0.67 10/23/2019   BILITOT 0.3 10/23/2019   ALKPHOS 77 10/23/2019   AST 18 10/23/2019   ALT 14 10/23/2019   PROT 7.4 10/23/2019   ALBUMIN 4.4 10/23/2019   CALCIUM 9.8 10/23/2019   ANIONGAP 9 05/22/2019   Lab Results  Component Value Date   CHOL 253 (H) 10/23/2019   Lab Results  Component Value Date   HDL 55 10/23/2019   Lab Results  Component Value Date   LDLCALC 151 (H) 10/23/2019   Lab Results  Component Value Date   TRIG 260 (H) 10/23/2019   Lab Results  Component Value Date   CHOLHDL 4.6 (H) 10/23/2019   No results found for: HGBA1C     Assessment & Plan:   Problem List Items Addressed This Visit       Cardiovascular and Mediastinum   Essential hypertension    Blood pressure currently stable off medications will observe for now  Check thyroid function      Relevant Orders   Comprehensive metabolic panel   CBC with Differential/Platelet   Thyroid Panel With TSH     Other   Mixed hyperlipidemia    Plan repeat lipid panel may yet require treatment      Alcohol use    Advised to reduce  alcohol use      Other Visit Diagnoses     Encounter for health-related screening    -  Primary   Relevant Orders   Comprehensive metabolic panel   CBC with Differential/Platelet   Lipid panel   Thyroid Panel With TSH   Hemoglobin A1c   Encounter for screening mammogram for malignant neoplasm of breast       Relevant Orders   MM DIGITAL SCREENING BILATERAL      Plan repeat Pap smear and mammogram  Screen for hemoglobin and A1c screen for anemia and evaluate renal function liver function with alcohol use    Follow-up: Return in about 4 months (around 04/18/2021).    Asencion Noble, MD

## 2020-12-17 NOTE — Patient Instructions (Signed)
No medications you do not need to be on blood pressure medicine for now  Screening labs will be obtained today  Flu shot was given   Mammogram was ordered  Please return to see Tasha Nguyen for another Pap smear I am concerned with your history of HPV whether or not you have precancerous changes on your cervical area  Please obtain an appointment for your husband Tasha Nguyen on the checkout I need to see him sometime in October  Return to see me in 4 months  Please watch her alcohol intake try to moderate that to down to 1 or 2 beers every day or every other day  Follow a healthy diet as attached

## 2020-12-17 NOTE — Assessment & Plan Note (Signed)
Plan repeat lipid panel may yet require treatment

## 2020-12-17 NOTE — Assessment & Plan Note (Signed)
Advised to reduce  alcohol use

## 2020-12-17 NOTE — Assessment & Plan Note (Addendum)
Blood pressure currently stable off medications will observe for now  Check thyroid function

## 2020-12-18 LAB — CBC WITH DIFFERENTIAL/PLATELET
Basophils Absolute: 0 10*3/uL (ref 0.0–0.2)
Basos: 0 %
EOS (ABSOLUTE): 0.1 10*3/uL (ref 0.0–0.4)
Eos: 1 %
Hematocrit: 43.1 % (ref 34.0–46.6)
Hemoglobin: 14.3 g/dL (ref 11.1–15.9)
Immature Grans (Abs): 0 10*3/uL (ref 0.0–0.1)
Immature Granulocytes: 0 %
Lymphocytes Absolute: 2.6 10*3/uL (ref 0.7–3.1)
Lymphs: 51 %
MCH: 31.7 pg (ref 26.6–33.0)
MCHC: 33.2 g/dL (ref 31.5–35.7)
MCV: 96 fL (ref 79–97)
Monocytes Absolute: 0.4 10*3/uL (ref 0.1–0.9)
Monocytes: 8 %
Neutrophils Absolute: 2.1 10*3/uL (ref 1.4–7.0)
Neutrophils: 40 %
Platelets: 380 10*3/uL (ref 150–450)
RBC: 4.51 x10E6/uL (ref 3.77–5.28)
RDW: 13 % (ref 11.7–15.4)
WBC: 5.1 10*3/uL (ref 3.4–10.8)

## 2020-12-18 LAB — COMPREHENSIVE METABOLIC PANEL
ALT: 14 IU/L (ref 0–32)
AST: 20 IU/L (ref 0–40)
Albumin/Globulin Ratio: 1.6 (ref 1.2–2.2)
Albumin: 4.1 g/dL (ref 3.8–4.8)
Alkaline Phosphatase: 69 IU/L (ref 44–121)
BUN/Creatinine Ratio: 26 (ref 12–28)
BUN: 17 mg/dL (ref 8–27)
Bilirubin Total: 0.2 mg/dL (ref 0.0–1.2)
CO2: 21 mmol/L (ref 20–29)
Calcium: 9.6 mg/dL (ref 8.7–10.3)
Chloride: 106 mmol/L (ref 96–106)
Creatinine, Ser: 0.65 mg/dL (ref 0.57–1.00)
Globulin, Total: 2.6 g/dL (ref 1.5–4.5)
Glucose: 79 mg/dL (ref 65–99)
Potassium: 4.3 mmol/L (ref 3.5–5.2)
Sodium: 143 mmol/L (ref 134–144)
Total Protein: 6.7 g/dL (ref 6.0–8.5)
eGFR: 99 mL/min/{1.73_m2} (ref >59)

## 2020-12-18 LAB — LIPID PANEL
Chol/HDL Ratio: 4.3 ratio (ref 0.0–4.4)
Cholesterol, Total: 214 mg/dL — ABNORMAL HIGH (ref 100–199)
HDL: 50 mg/dL (ref >39)
LDL Chol Calc (NIH): 150 mg/dL — ABNORMAL HIGH (ref 0–99)
Triglycerides: 81 mg/dL (ref 0–149)
VLDL Cholesterol Cal: 14 mg/dL (ref 5–40)

## 2020-12-18 LAB — HEMOGLOBIN A1C
Est. average glucose Bld gHb Est-mCnc: 117 mg/dL
Hgb A1c MFr Bld: 5.7 % — ABNORMAL HIGH (ref 4.8–5.6)

## 2020-12-18 LAB — THYROID PANEL WITH TSH
Free Thyroxine Index: 1.7 (ref 1.2–4.9)
T3 Uptake Ratio: 30 % (ref 24–39)
T4, Total: 5.5 ug/dL (ref 4.5–12.0)
TSH: 0.342 u[IU]/mL — ABNORMAL LOW (ref 0.450–4.500)

## 2020-12-20 ENCOUNTER — Telehealth: Payer: Self-pay

## 2020-12-20 NOTE — Telephone Encounter (Signed)
Patient was called and a voicemail was left informing patient to return phone call for lab results.   CRM created to give results to patient when call is returned.

## 2020-12-20 NOTE — Telephone Encounter (Signed)
-----   Message from Elsie Stain, MD sent at 12/18/2020  6:11 AM EDT ----- Let pt know kidney, liver normal, blood counts normal,  no diabetes, mild elevated cholesterol recommend health diet, , thyroid is normal

## 2020-12-30 ENCOUNTER — Encounter: Payer: No Typology Code available for payment source | Admitting: Critical Care Medicine

## 2021-01-13 ENCOUNTER — Ambulatory Visit: Payer: Self-pay | Admitting: Nurse Practitioner

## 2021-01-17 ENCOUNTER — Ambulatory Visit: Payer: Self-pay

## 2021-02-26 ENCOUNTER — Ambulatory Visit
Admission: RE | Admit: 2021-02-26 | Discharge: 2021-02-26 | Disposition: A | Discharge status: Home / Self Care | Payer: BC Managed Care – PPO | Source: Ambulatory Visit | Attending: Critical Care Medicine | Admitting: Critical Care Medicine

## 2021-02-26 DIAGNOSIS — Z1231 Encounter for screening mammogram for malignant neoplasm of breast: Secondary | ICD-10-CM

## 2021-04-20 NOTE — Progress Notes (Signed)
Established Patient Office Visit  Subjective:  Patient ID: Tasha Nguyen, female    DOB: 1955/05/11  Age: 66 y.o. MRN: [id number] Virtual Visit via Telephone Note  I connected with Selinda Michaels on 04/21/21 at  8:30 AM EST by telephone and verified that I am speaking with the correct person using two identifiers.   Consent:  I discussed the limitations, risks, security and privacy concerns of performing an evaluation and management service by telephone and the availability of in person appointments. I also discussed with the patient that there may be a patient responsible charge related to this service. The patient expressed understanding and agreed to proceed.  Location of patient: Patient's at home  Location of provider: I am in my office  Persons participating in the televisit with the patient.   No one else on the call    History of Present Illness:   [name] presents for primary care follow-up and this is a phone visit.  Note she was not able to have technology for video  Patient was last seen in September and since that visit has been doing very well her blood pressure at the last visit was normal and she has not been on any medications for blood pressure.  Note she is not smoking at this time.  Patient does note she has had some visual changes and increased frequency of urination but no dysuria.  The patient also is drinking 3  24 ounce beers every other day and is interested in further counseling in this regard.  Patient does need a pneumonia vaccine but is already had her flu vaccine.  She works at the [employer].  She has seen podiatry in the past and would like a follow-up visit for this.  She has no other real complaints at this time.     Past Medical History:  Diagnosis Date   Colon polyps     Past Surgical History:  Procedure Laterality Date   none      Family History  Problem Relation Age of Onset   Diabetes Mother    CAD Father    CAD  Sister    Diabetes Sister    Diabetes Sister    Colon cancer Neg Hx    Esophageal cancer Neg Hx    Stomach cancer Neg Hx    Pancreatic cancer Neg Hx    Liver disease Neg Hx    Rectal cancer Neg Hx    Breast cancer Neg Hx     Social History   Socioeconomic History   Marital status: Single    Spouse name: Not on file   Number of children: Not on file   Years of education: Not on file   Highest education level: Not on file  Occupational History   Not on file  Tobacco Use   Smoking status: Never   Smokeless tobacco: Never  Vaping Use   Vaping Use: Never used  Substance and Sexual Activity   Alcohol use: Yes    Comment: weekends-4-5 beers   Drug use: Yes    Types: Marijuana    Comment: occ   Sexual activity: Yes    Birth control/protection: None  Other Topics Concern   Not on file  Social History Narrative   Not on file   Social Determinants of Health   Financial Resource Strain: Not on file  Food Insecurity: Not on file  Transportation Needs: Not on file  Physical Activity: Not on file  Stress:  Not on file  Social Connections: Not on file  Intimate Partner Violence: Not on file    No outpatient medications prior to visit.   No facility-administered medications prior to visit.    Allergies  Allergen Reactions   Hydrocodone-Acetaminophen     Other reaction(s): Other (See Comments) unknown   Oxycodone Other (See Comments)    Hallucinations Other reaction(s): Other (See Comments) Hallucinations   Oxycodone-Acetaminophen     Other reaction(s): Other (See Comments) unknown    ROS Review of Systems  Constitutional: Negative.   HENT: Negative.  Negative for ear pain, postnasal drip, rhinorrhea, sinus pressure, sore throat, trouble swallowing and voice change.   Eyes:  Positive for visual disturbance.  Respiratory: Negative.  Negative for apnea, cough, choking, chest tightness, shortness of breath, wheezing and stridor.   Cardiovascular: Negative.   Negative for chest pain, palpitations and leg swelling.  Gastrointestinal: Negative.  Negative for abdominal distention, abdominal pain, nausea and vomiting.  Genitourinary:  Positive for frequency and urgency. Negative for decreased urine volume, difficulty urinating, dyspareunia, dysuria, enuresis, flank pain, genital sores, hematuria and pelvic pain.  Musculoskeletal: Negative.  Negative for arthralgias and myalgias.  Skin: Negative.  Negative for rash.  Allergic/Immunologic: Negative.  Negative for environmental allergies and food allergies.  Neurological: Negative.  Negative for dizziness, tremors, seizures, syncope, weakness and headaches.  Hematological: Negative.  Negative for adenopathy. Does not bruise/bleed easily.  Psychiatric/Behavioral: Negative.  Negative for agitation, decreased concentration, dysphoric mood, self-injury, sleep disturbance and suicidal ideas. The patient is not nervous/anxious.      Objective:    Physical Exam No exam this is a phone visit There were no vitals taken for this visit. Wt Readings from Last 3 Encounters:  12/17/20 132 lb 6.4 oz (60.1 kg)  03/08/20 128 lb (58.1 kg)  02/14/20 128 lb (58.1 kg)     Health Maintenance Due  Topic Date Due   Pneumococcal Vaccine 47-54 Years old (2 - PCV) 08/16/2009   COVID-19 Vaccine (3 - Booster for Pfizer series) 11/02/2019    There are no preventive care reminders to display for this patient.  Lab Results  Component Value Date   TSH 0.342 (L) 12/17/2020   Lab Results  Component Value Date   WBC 5.1 12/17/2020   HGB 14.3 12/17/2020   HCT 43.1 12/17/2020   MCV 96 12/17/2020   PLT 380 12/17/2020   Lab Results  Component Value Date   NA 143 12/17/2020   K 4.3 12/17/2020   CO2 21 12/17/2020   GLUCOSE 79 12/17/2020   BUN 17 12/17/2020   CREATININE 0.65 12/17/2020   BILITOT 0.2 12/17/2020   ALKPHOS 69 12/17/2020   AST 20 12/17/2020   ALT 14 12/17/2020   PROT 6.7 12/17/2020   ALBUMIN 4.1  12/17/2020   CALCIUM 9.6 12/17/2020   ANIONGAP 9 05/22/2019   EGFR 99 12/17/2020   Lab Results  Component Value Date   CHOL 214 (H) 12/17/2020   Lab Results  Component Value Date   HDL 50 12/17/2020   Lab Results  Component Value Date   LDLCALC 150 (H) 12/17/2020   Lab Results  Component Value Date   TRIG 81 12/17/2020   Lab Results  Component Value Date   CHOLHDL 4.3 12/17/2020   Lab Results  Component Value Date   HGBA1C 5.7 (H) 12/17/2020      Assessment & Plan:   Problem List Items Addressed This Visit       Cardiovascular and Mediastinum  Essential hypertension    We need updated vital signs on this patient she will be brought in for a Prevnar 20 pneumonia vaccine and will obtain a blood pressure reading at that visit and then she will return to see Dr. Joya Gaskins in March  Note this patient's not on antihypertensive therapy had normal blood pressure at the last visit we will see what her vital signs are for now we will hold off on medication        Musculoskeletal and Integument   Callus   Relevant Orders   Ambulatory referral to [place] toe    Referral back to podiatry was made      Relevant Orders   Ambulatory referral to Podiatry     Other   Mixed hyperlipidemia    Elevated cholesterol but is on no medication at this time is diet controlled      Alcohol use    Still has significant alcohol use will refer to licensed clinical social worker for counseling      Generalized anxiety disorder    As per alcohol use assessment      Urinary frequency - Primary    Urinalysis will be obtained      Relevant Orders   Urinalysis   Vision changes    Referral to ophthalmology made      Relevant Orders   Ambulatory referral to Ophthalmology    No orders of the defined types were placed in this encounter.    Follow Up Instructions: Patient knows a follow-up visit will occur in March and she will come in for a nurse visit for a  Prevnar 20 vaccine and have vital signs checked and obtain urinalysis, she will have an appoint with one of the providers in the clinic for a Pap smear, she will have a ophthalmology referral made to [facility] ophthalmology for vision check and also she will be called by our clinical social worker to discuss her alcohol use and a referral to podiatry will also be made for her hammertoe and foot callus   I discussed the assessment and treatment plan with the patient. The patient was provided an opportunity to ask questions and all were answered. The patient agreed with the plan and demonstrated an understanding of the instructions.   The patient was advised to call back or seek an in-person evaluation if the symptoms worsen or if the condition fails to improve as anticipated.  I provided 31 minutes of non-face-to-face time during this encounter  including  median intraservice time , review of notes, labs, imaging, medications  and explaining diagnosis and management to the patient .    Asencion Noble, MD

## 2021-04-21 ENCOUNTER — Ambulatory Visit: Payer: BC Managed Care – PPO | Attending: Critical Care Medicine | Admitting: Critical Care Medicine

## 2021-04-21 ENCOUNTER — Other Ambulatory Visit: Payer: Self-pay

## 2021-04-21 ENCOUNTER — Encounter: Payer: Self-pay | Admitting: Critical Care Medicine

## 2021-04-21 ENCOUNTER — Telehealth: Payer: Self-pay | Admitting: Critical Care Medicine

## 2021-04-21 ENCOUNTER — Telehealth: Payer: Self-pay

## 2021-04-21 DIAGNOSIS — H539 Unspecified visual disturbance: Secondary | ICD-10-CM | POA: Diagnosis not present

## 2021-04-21 DIAGNOSIS — M2040 Other hammer toe(s) (acquired), unspecified foot: Secondary | ICD-10-CM

## 2021-04-21 DIAGNOSIS — Z789 Other specified health status: Secondary | ICD-10-CM

## 2021-04-21 DIAGNOSIS — R350 Frequency of micturition: Secondary | ICD-10-CM

## 2021-04-21 DIAGNOSIS — F411 Generalized anxiety disorder: Secondary | ICD-10-CM

## 2021-04-21 DIAGNOSIS — L84 Corns and callosities: Secondary | ICD-10-CM | POA: Diagnosis not present

## 2021-04-21 DIAGNOSIS — E782 Mixed hyperlipidemia: Secondary | ICD-10-CM

## 2021-04-21 DIAGNOSIS — I10 Essential (primary) hypertension: Secondary | ICD-10-CM

## 2021-04-21 NOTE — Telephone Encounter (Signed)
Very nice pt   moderate ETOH use and GAD  pls connect

## 2021-04-21 NOTE — Assessment & Plan Note (Signed)
Still has significant alcohol use will refer to licensed clinical social worker for counseling

## 2021-04-21 NOTE — Assessment & Plan Note (Addendum)
We need updated vital signs on this patient she will be brought in for a Prevnar 20 pneumonia vaccine and will obtain a blood pressure reading at that visit and then she will return to see Dr. Joya Gaskins in March  Note this patient's not on antihypertensive therapy had normal blood pressure at the last visit we will see what her vital signs are for now we will hold off on medication

## 2021-04-21 NOTE — Assessment & Plan Note (Signed)
Referral to ophthalmology made 

## 2021-04-21 NOTE — Assessment & Plan Note (Signed)
As per alcohol use assessment

## 2021-04-21 NOTE — Assessment & Plan Note (Signed)
Urinalysis will be obtained

## 2021-04-21 NOTE — Assessment & Plan Note (Signed)
Referral back to podiatry was made

## 2021-04-21 NOTE — Telephone Encounter (Signed)
-----   Message from Tasha Stain, MD sent at 04/21/2021  8:59 AM EST ----- This pt needs appt for PAP with any provider at [facility] in Feb/march  Pt needs Nurse appt for Vital sign check, and PCV 20 vax and to collect urinalysis order inputted  Needs face to face with dr Joya Gaskins in march

## 2021-04-21 NOTE — Telephone Encounter (Signed)
Pt was called and VM was left informing patient to call ad schedule appointment for Pap and nurse visit for UA and vaccines.

## 2021-04-21 NOTE — Assessment & Plan Note (Signed)
Elevated cholesterol but is on no medication at this time is diet controlled

## 2021-04-22 ENCOUNTER — Telehealth: Payer: Self-pay

## 2021-04-22 NOTE — Telephone Encounter (Signed)
Called pt to schedule appointment for pap and a couple of other pocts. Called twice( separate days)

## 2021-04-23 ENCOUNTER — Other Ambulatory Visit: Payer: Self-pay

## 2021-04-23 ENCOUNTER — Ambulatory Visit: Payer: BC Managed Care – PPO

## 2021-04-23 ENCOUNTER — Encounter: Payer: Self-pay | Admitting: Podiatry

## 2021-04-23 ENCOUNTER — Ambulatory Visit (INDEPENDENT_AMBULATORY_CARE_PROVIDER_SITE_OTHER): Payer: BC Managed Care – PPO | Admitting: Podiatry

## 2021-04-23 DIAGNOSIS — Q666 Other congenital valgus deformities of feet: Secondary | ICD-10-CM

## 2021-04-23 DIAGNOSIS — M2042 Other hammer toe(s) (acquired), left foot: Secondary | ICD-10-CM

## 2021-04-23 DIAGNOSIS — L989 Disorder of the skin and subcutaneous tissue, unspecified: Secondary | ICD-10-CM

## 2021-04-23 NOTE — Progress Notes (Signed)
SITUATION Reason for Consult: Evaluation for Bilateral Custom Foot Orthoses Patient / Caregiver Report: Patient is ready for foot orthotics  OBJECTIVE DATA: Patient History / Diagnosis:    ICD-10-CM   1. Pes planovalgus  Q66.6     2. Hammer toe of left foot  M20.42       Current or Previous Devices: None and no history  Foot Examination: Skin presentation:   Intact Ulcers & Callousing:   None and no history Toe / Foot Deformities:  Pes planovalgus, hammertoes Weight Bearing Presentation:  planus Sensation:    Intact  ORTHOTIC RECOMMENDATION Recommended Device: 1x pair of custom functional foot orthotics  GOALS OF ORTHOSES - Reduce Pain - Prevent Foot Deformity - Prevent Progression of Further Foot Deformity - Relieve Pressure - Improve the Overall Biomechanical Function of the Foot and Lower Extremity.  ACTIONS PERFORMED Patient was casted for Foot Orthoses via crush box. Procedure was explained and patient tolerated procedure well. All questions were answered and concerns addressed.  PLAN Potential out of pocket cost was communicated to patient. Casts are to be sent to [facility] for fabrication. Patient is to be called for fitting when devices are ready.

## 2021-04-23 NOTE — Progress Notes (Signed)
°  Subjective:  Patient ID: SOLENNE MANWARREN, female    DOB: 1955-06-15,  MRN: [id number]  Chief Complaint  Patient presents with   Callouses    66 y.o. female presents with the above complaint.  Patient presents with left submetatarsal 2/5/1 hyperkeratotic lesion/porokeratotic lesion.  Painful to touch.  Has progressive gotten worse debrided to help.  She does not wear orthotics.  She denies any other acute complaints.   Review of Systems: Negative except as noted in the HPI. Denies N/V/F/Ch.  Past Medical History:  Diagnosis Date   Colon polyps    No current outpatient medications on file.  Social History   Tobacco Use  Smoking Status Never  Smokeless Tobacco Never    Allergies  Allergen Reactions   Hydrocodone-Acetaminophen     Other reaction(s): Other (See Comments) unknown   Oxycodone Other (See Comments)    Hallucinations Other reaction(s): Other (See Comments) Hallucinations   Oxycodone-Acetaminophen     Other reaction(s): Other (See Comments) unknown   Objective:  There were no vitals filed for this visit. There is no height or weight on file to calculate BMI. Constitutional Well developed. Well nourished.  Vascular Dorsalis pedis pulses palpable bilaterally. Posterior tibial pulses palpable bilaterally. Capillary refill normal to all digits.  No cyanosis or clubbing noted. Pedal hair growth normal.  Neurologic Normal speech. Oriented to person, place, and time. Epicritic sensation to light touch grossly present bilaterally.  Dermatologic  hyperkeratotic lesion noted to left submetatarsal two as well as left lateral fifth and hallux digit.  No pinpoint bleeding noted.  No central nucleated core noted.  Orthopedic: Normal joint ROM without pain or crepitus bilaterally. No visible deformities. No bony tenderness.   Radiographs: None Assessment:   1. Pes planovalgus   2. Benign skin lesion     Plan:  Patient was evaluated and treated and all  questions answered.  Left fifth digit and distal left submetatarsal t porokeratosis/benign skin lesion with underlying pes planovalgus foot structure -I explained patient the etiology of hyperkeratotic lesion/benign skin lesion various treatment options were extensively discussed.  This likely has to do the weightbearing distribution and tight shoes.  I discussed with her shoe gear modification and provided her with debridement of the hyperkeratotic lesion.  Patient states that she will make the shoe gear modification as we discussed as well as utilize toe protector. -Using chisel blade and a handle, the hyperkeratotic lesion was debrided down to healthy striated tissue.  No pinpoint bleeding noted.  No central nucleated core noted. -She will also benefit from orthotics to take the pressure off of left 1 2 and 5.  I discussed the importance of orthotics emergency room and options were discussed.  She states understanding.  No follow-ups on file.

## 2021-04-24 ENCOUNTER — Ambulatory Visit: Payer: BC Managed Care – PPO | Attending: Critical Care Medicine

## 2021-04-24 DIAGNOSIS — Z23 Encounter for immunization: Secondary | ICD-10-CM | POA: Diagnosis not present

## 2021-04-24 DIAGNOSIS — R350 Frequency of micturition: Secondary | ICD-10-CM

## 2021-04-25 LAB — URINALYSIS
Bilirubin, UA: NEGATIVE
Glucose, UA: NEGATIVE
Ketones, UA: NEGATIVE
Nitrite, UA: NEGATIVE
Protein,UA: NEGATIVE
RBC, UA: NEGATIVE
Specific Gravity, UA: 1.01 (ref 1.005–1.030)
Urobilinogen, Ur: 0.2 mg/dL (ref 0.2–1.0)
pH, UA: 6 (ref 5.0–7.5)

## 2021-05-15 ENCOUNTER — Ambulatory Visit: Payer: BC Managed Care – PPO | Attending: Physician Assistant | Admitting: Physician Assistant

## 2021-05-15 ENCOUNTER — Encounter: Payer: Self-pay | Admitting: Physician Assistant

## 2021-05-15 ENCOUNTER — Other Ambulatory Visit (HOSPITAL_COMMUNITY)
Admission: RE | Admit: 2021-05-15 | Discharge: 2021-05-15 | Disposition: A | Discharge status: Home / Self Care | Payer: BC Managed Care – PPO | Source: Ambulatory Visit | Attending: Physician Assistant | Admitting: Physician Assistant

## 2021-05-15 ENCOUNTER — Other Ambulatory Visit: Payer: Self-pay

## 2021-05-15 VITALS — BP 122/86 | HR 89 | Ht 69.0 in | Wt 136.2 lb

## 2021-05-15 DIAGNOSIS — Z8619 Personal history of other infectious and parasitic diseases: Secondary | ICD-10-CM

## 2021-05-15 DIAGNOSIS — Z124 Encounter for screening for malignant neoplasm of cervix: Secondary | ICD-10-CM | POA: Diagnosis not present

## 2021-05-15 NOTE — Progress Notes (Signed)
Patient ID: Tasha Nguyen, female   DOB: 1955-08-26, 66 y.o.   MRN: [id number]   Tasha Nguyen, is a 66 y.o. female  IWP:[id number]  NKN:[id number]  DOB - 06/18/1955  Chief Complaint  Patient presents with   Gynecologic Exam       Subjective:   Tasha Nguyen is a 66 y.o. female here today for pap smear.  No concerns or issues.  Works at [employer] and [employer].  No vaginal discharge.  No problems updated.  ALLERGIES: Allergies  Allergen Reactions   Hydrocodone-Acetaminophen     Other reaction(s): Other (See Comments) unknown   Oxycodone Other (See Comments)    Hallucinations Other reaction(s): Other (See Comments) Hallucinations   Oxycodone-Acetaminophen     Other reaction(s): Other (See Comments) unknown    PAST MEDICAL HISTORY: Past Medical History:  Diagnosis Date   Colon polyps     MEDICATIONS AT HOME: Prior to Admission medications   Not on File    ROS: Neg HEENT Neg resp Neg cardiac Neg GI Neg GU Neg MS Neg psych Neg neuro  Objective:   Vitals:   05/15/21 0904  BP: 122/86  Pulse: 89  SpO2: 98%  Weight: 136 lb 4 oz (61.8 kg)  Height: 5\' 9"  (1.753 m)   Exam General appearance : Awake, alert, not in any distress. Speech Clear. Not toxic looking; thin HEENT: Atraumatic and Normocephalic Chest: Good air entry bilaterally, CTAB.  No rales/rhonchi/wheezing CVS: S1 S2 regular, no murmurs.  GU:  external genitalia WNL.  Speculum inserted.  Cervix without lesion but only partially visible. Pap and ancillary swabs taken.  Bimanual unremarkable Extremities: B/L Lower Ext shows no edema, both legs are warm to touch Neurology: Awake alert, and oriented X 3, CN II-XII intact, Non focal Skin: No Rash  Data Review Lab Results  Component Value Date   HGBA1C 5.7 (H) 12/17/2020    Assessment & Plan   1. Encounter for Papanicolaou smear for cervical cancer screening - Cytology - PAP([place])  2. H/O human papillomavirus  infection - Cytology - PAP([place]) - Cervicovaginal ancillary only    Patient have been counseled extensively about nutrition and exercise. Other issues discussed during this visit include: low cholesterol diet, weight control and daily exercise, foot care, annual eye examinations at Ophthalmology, importance of adherence with medications and regular follow-up. We also discussed long term complications of uncontrolled diabetes and hypertension.   Return in about 6 months (around 11/12/2021).  The patient was given clear instructions to go to ER or return to medical center if symptoms don't improve, worsen or new problems develop. The patient verbalized understanding. The patient was told to call to get lab results if they haven't heard anything in the next week.      Freeman Caldron, PA-C [facility] and [facility] [place], [place]   05/15/2021, 9:17 AM

## 2021-05-16 LAB — CERVICOVAGINAL ANCILLARY ONLY
Bacterial Vaginitis (gardnerella): NEGATIVE
Candida Glabrata: NEGATIVE
Candida Vaginitis: NEGATIVE
Chlamydia: NEGATIVE
Comment: NEGATIVE
Comment: NEGATIVE
Comment: NEGATIVE
Comment: NEGATIVE
Comment: NEGATIVE
Comment: NORMAL
Neisseria Gonorrhea: NEGATIVE
Trichomonas: NEGATIVE

## 2021-05-19 LAB — CYTOLOGY - PAP: Diagnosis: NEGATIVE

## 2021-05-21 ENCOUNTER — Other Ambulatory Visit: Payer: Self-pay

## 2021-05-21 ENCOUNTER — Ambulatory Visit: Payer: BC Managed Care – PPO

## 2021-05-21 DIAGNOSIS — M2042 Other hammer toe(s) (acquired), left foot: Secondary | ICD-10-CM

## 2021-05-21 DIAGNOSIS — Q666 Other congenital valgus deformities of feet: Secondary | ICD-10-CM

## 2021-05-21 NOTE — Progress Notes (Signed)
SITUATION: Reason for Visit: Fitting and Delivery of Custom Fabricated Foot Orthoses Patient Report: Patient reports comfort and is satisfied with device.  OBJECTIVE DATA: Patient History / Diagnosis:     ICD-10-CM   1. Pes planovalgus  Q66.6     2. Hammer toe of left foot  M20.42       Provided Device:  Optometrist     [facility]: [id number]   GOAL OF ORTHOSIS - Improve gait - Decrease energy expenditure - Improve Balance - Provide Triplanar stability of foot complex - Facilitate motion  ACTIONS PERFORMED Patient was fit with foot orthotics trimmed to shoe last. Patient tolerated fittign procedure.   Patient was provided with verbal and written instruction and demonstration regarding donning, doffing, wear, care, proper fit, function, purpose, cleaning, and use of the orthosis and in all related precautions and risks and benefits regarding the orthosis.  Patient was also provided with verbal instruction regarding how to report any failures or malfunctions of the orthosis and necessary follow up care. Patient was also instructed to contact our office regarding any change in status that may affect the function of the orthosis.  Patient demonstrated independence with proper donning, doffing, and fit and verbalized understanding of all instructions.  PLAN: Patient is to follow up in one week or as necessary (PRN). All questions were answered and concerns addressed. Plan of care was discussed with and agreed upon by the patient.

## 2021-05-23 ENCOUNTER — Encounter: Payer: Self-pay | Admitting: *Deleted

## 2021-06-09 NOTE — Telephone Encounter (Signed)
I attempted to call pt, no answer, left vm.

## 2021-06-12 NOTE — Telephone Encounter (Signed)
I attempted to call pt again, no answer left vm

## 2021-06-25 ENCOUNTER — Other Ambulatory Visit: Payer: Self-pay

## 2021-06-25 ENCOUNTER — Encounter: Payer: Self-pay | Admitting: Critical Care Medicine

## 2021-06-25 ENCOUNTER — Ambulatory Visit: Payer: BC Managed Care – PPO | Attending: Critical Care Medicine | Admitting: Critical Care Medicine

## 2021-06-25 VITALS — BP 124/84 | HR 79 | Temp 98.3°F | Resp 18 | Ht 69.0 in | Wt 135.0 lb

## 2021-06-25 DIAGNOSIS — F1090 Alcohol use, unspecified, uncomplicated: Secondary | ICD-10-CM

## 2021-06-25 DIAGNOSIS — R7303 Prediabetes: Secondary | ICD-10-CM

## 2021-06-25 DIAGNOSIS — E782 Mixed hyperlipidemia: Secondary | ICD-10-CM

## 2021-06-25 DIAGNOSIS — M6780 Other specified disorders of synovium and tendon, unspecified site: Secondary | ICD-10-CM

## 2021-06-25 DIAGNOSIS — I10 Essential (primary) hypertension: Secondary | ICD-10-CM

## 2021-06-25 DIAGNOSIS — L84 Corns and callosities: Secondary | ICD-10-CM | POA: Diagnosis not present

## 2021-06-25 DIAGNOSIS — Z789 Other specified health status: Secondary | ICD-10-CM

## 2021-06-25 DIAGNOSIS — M25511 Pain in right shoulder: Secondary | ICD-10-CM | POA: Insufficient documentation

## 2021-06-25 MED ORDER — DICLOFENAC SODIUM 1 % EX GEL: 2.0000 g | Freq: Four times a day (QID) | CUTANEOUS | 2 refills | Status: DC

## 2021-06-25 NOTE — Assessment & Plan Note (Signed)
Reassess lipid panel at this visit ?

## 2021-06-25 NOTE — Assessment & Plan Note (Signed)
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

## 2021-06-25 NOTE — Assessment & Plan Note (Signed)
Blood pressure stable off medications. 

## 2021-06-25 NOTE — Patient Instructions (Addendum)
Referral to foot doctor made ? ?Take voltaren gel to right shoulder 4 times a day and do shoulder exercises, sent to your [facility] ? ?No other changes ? ?Return dr Joya Gaskins 6 months ?

## 2021-06-25 NOTE — Progress Notes (Signed)
? ?Established Patient Office Visit ? ?Subjective:  ?Patient ID: Tasha Nguyen, female    DOB: March 27, 1956  Age: 66 y.o. MRN: [id number] ? ? ?History of Present Illness: ?  ?06/25/21 ?Patient returns today for primary care visit complaining of swelling on the dorsum of the right foot and increased calluses at the base of the left foot.  She drinks about 2 beers daily.  She has no other real complaints at this visit.  On arrival blood pressure is good 124/84 and she is not on medications.  She is trying to follow a healthier diet.  This patient also complains of right shoulder pain that is started 2 days ago ? ?Past Medical History:  ?Diagnosis Date  ? Colon polyps   ? ? ?Past Surgical History:  ?Procedure Laterality Date  ? none    ? ? ?Family History  ?Problem Relation Age of Onset  ? Diabetes Mother   ? CAD Father   ? CAD Sister   ? Diabetes Sister   ? Diabetes Sister   ? Colon cancer Neg Hx   ? Esophageal cancer Neg Hx   ? Stomach cancer Neg Hx   ? Pancreatic cancer Neg Hx   ? Liver disease Neg Hx   ? Rectal cancer Neg Hx   ? Breast cancer Neg Hx   ? ? ?Social History  ? ?Socioeconomic History  ? Marital status: Single  ?  Spouse name: Not on file  ? Number of children: Not on file  ? Years of education: Not on file  ? Highest education level: Not on file  ?Occupational History  ? Not on file  ?Tobacco Use  ? Smoking status: Never  ? Smokeless tobacco: Never  ?Vaping Use  ? Vaping Use: Never used  ?Substance and Sexual Activity  ? Alcohol use: Yes  ?  Comment: weekends-4-5 beers  ? Drug use: Yes  ?  Types: Marijuana  ?  Comment: occ  ? Sexual activity: Yes  ?  Birth control/protection: None  ?Other Topics Concern  ? Not on file  ?Social History Narrative  ? Not on file  ? ?Social Determinants of Health  ? ?Financial Resource Strain: Not on file  ?Food Insecurity: Not on file  ?Transportation Needs: Not on file  ?Physical Activity: Not on file  ?Stress: Not on file  ?Social Connections: Not on file  ?Intimate  Partner Violence: Not on file  ? ? ?No outpatient medications prior to visit.  ? ?No facility-administered medications prior to visit.  ? ? ?Allergies  ?Allergen Reactions  ? Hydrocodone-Acetaminophen   ?  Other reaction(s): Other (See Comments) ?unknown  ? Oxycodone Other (See Comments)  ?  Hallucinations ?Other reaction(s): Other (See Comments) ?Hallucinations  ? Oxycodone-Acetaminophen   ?  Other reaction(s): Other (See Comments) ?unknown  ? ? ?ROS ?Review of Systems  ?Constitutional: Negative.   ?HENT: Negative.  Negative for ear pain, postnasal drip, rhinorrhea, sinus pressure, sore throat, trouble swallowing and voice change.   ?Eyes:  Negative for visual disturbance.  ?Respiratory: Negative.  Negative for apnea, cough, choking, chest tightness, shortness of breath, wheezing and stridor.   ?Cardiovascular: Negative.  Negative for chest pain, palpitations and leg swelling.  ?Gastrointestinal: Negative.  Negative for abdominal distention, abdominal pain, nausea and vomiting.  ?Genitourinary:  Negative for decreased urine volume, difficulty urinating, dyspareunia, dysuria, enuresis, flank pain, frequency, genital sores, hematuria, pelvic pain and urgency.  ?Musculoskeletal: Negative.  Negative for arthralgias and myalgias.  ?Skin: Negative.  Negative  for rash.  ?Allergic/Immunologic: Negative.  Negative for environmental allergies and food allergies.  ?Neurological: Negative.  Negative for dizziness, tremors, seizures, syncope, weakness and headaches.  ?Hematological: Negative.  Negative for adenopathy. Does not bruise/bleed easily.  ?Psychiatric/Behavioral: Negative.  Negative for agitation, decreased concentration, dysphoric mood, self-injury, sleep disturbance and suicidal ideas. The patient is not nervous/anxious.   ? ?  ?Objective:  ?  ?Physical Exam ?Vitals reviewed.  ?Constitutional:   ?   Appearance: Normal appearance. She is well-developed and normal weight. She is not diaphoretic.  ?HENT:  ?   Head:  Normocephalic and atraumatic.  ?   Nose: No nasal deformity, septal deviation, mucosal edema or rhinorrhea.  ?   Right Sinus: No maxillary sinus tenderness or frontal sinus tenderness.  ?   Left Sinus: No maxillary sinus tenderness or frontal sinus tenderness.  ?   Mouth/Throat:  ?   Pharynx: No oropharyngeal exudate.  ?Eyes:  ?   General: No scleral icterus. ?   Conjunctiva/sclera: Conjunctivae normal.  ?   Pupils: Pupils are equal, round, and reactive to light.  ?Neck:  ?   Thyroid: No thyromegaly.  ?   Vascular: No carotid bruit or JVD.  ?   Trachea: Trachea normal. No tracheal tenderness or tracheal deviation.  ?Cardiovascular:  ?   Rate and Rhythm: Normal rate and regular rhythm.  ?   Chest Wall: PMI is not displaced.  ?   Pulses: Normal pulses. No decreased pulses.  ?   Heart sounds: Normal heart sounds, S1 normal and S2 normal. Heart sounds not distant. No murmur heard. ?No systolic murmur is present.  ?No diastolic murmur is present.  ?  No friction rub. No gallop. No S3 or S4 sounds.  ?Pulmonary:  ?   Effort: No tachypnea, accessory muscle usage or respiratory distress.  ?   Breath sounds: No stridor. No decreased breath sounds, wheezing, rhonchi or rales.  ?Chest:  ?   Chest wall: No tenderness.  ?Abdominal:  ?   General: Bowel sounds are normal. There is no distension.  ?   Palpations: Abdomen is soft. Abdomen is not rigid.  ?   Tenderness: There is no abdominal tenderness. There is no guarding or rebound.  ?Musculoskeletal:     ?   General: Tenderness present. Normal range of motion.  ?   Cervical back: Normal range of motion and neck supple. No edema, erythema or rigidity. No muscular tenderness. Normal range of motion.  ?   Comments: Callus at the base plantar aspect left foot and tendon sheath cyst dorsum of right foot also hammertoe left fifth toe with callus ? ?Tender over the back of the right shoulder  ?Lymphadenopathy:  ?   Head:  ?   Right side of head: No submental or submandibular adenopathy.   ?   Left side of head: No submental or submandibular adenopathy.  ?   Cervical: No cervical adenopathy.  ?Skin: ?   General: Skin is warm and dry.  ?   Coloration: Skin is not pale.  ?   Findings: No rash.  ?   Nails: There is no clubbing.  ?Neurological:  ?   Mental Status: She is alert and oriented to person, place, and time.  ?   Sensory: No sensory deficit.  ?Psychiatric:     ?   Speech: Speech normal.     ?   Behavior: Behavior normal.  ? ?No exam this is a phone visit ?BP 124/84 (BP Location: Left  Arm, Patient Position: Sitting, Cuff Size: Normal)   Pulse 79   Temp 98.3 ?F (36.8 ?C) (Oral)   Resp 18   Ht _0  (1.753 m)   Wt 135 lb (61.2 kg)   SpO2 100%   BMI 19.94 kg/m?  ?Wt Readings from Last 3 Encounters:  ?06/25/21 135 lb (61.2 kg)  ?05/15/21 136 lb 4 oz (61.8 kg)  ?12/17/20 132 lb 6.4 oz (60.1 kg)  ? ? ? ?Health Maintenance Due  ?Topic Date Due  ? COVID-19 Vaccine (3 - Booster for Pfizer series) 11/02/2019  ? ? ?There are no preventive care reminders to display for this patient. ? ?Lab Results  ?Component Value Date  ? TSH 0.342 (L) 12/17/2020  ? ?Lab Results  ?Component Value Date  ? WBC 5.1 12/17/2020  ? HGB 14.3 12/17/2020  ? HCT 43.1 12/17/2020  ? MCV 96 12/17/2020  ? PLT 380 12/17/2020  ? ?Lab Results  ?Component Value Date  ? NA 143 12/17/2020  ? K 4.3 12/17/2020  ? CO2 21 12/17/2020  ? GLUCOSE 79 12/17/2020  ? BUN 17 12/17/2020  ? CREATININE 0.65 12/17/2020  ? BILITOT 0.2 12/17/2020  ? ALKPHOS 69 12/17/2020  ? AST 20 12/17/2020  ? ALT 14 12/17/2020  ? PROT 6.7 12/17/2020  ? ALBUMIN 4.1 12/17/2020  ? CALCIUM 9.6 12/17/2020  ? ANIONGAP 9 05/22/2019  ? EGFR 99 12/17/2020  ? ?Lab Results  ?Component Value Date  ? CHOL 214 (H) 12/17/2020  ? ?Lab Results  ?Component Value Date  ? HDL 50 12/17/2020  ? ?Lab Results  ?Component Value Date  ? LDLCALC 150 (H) 12/17/2020  ? ?Lab Results  ?Component Value Date  ? TRIG 81 12/17/2020  ? ?Lab Results  ?Component Value Date  ? CHOLHDL 4.3 12/17/2020  ? ?Lab  Results  ?Component Value Date  ? HGBA1C 5.7 (H) 12/17/2020  ? ? ?  ?Assessment & Plan:  ? ?Problem List Items Addressed This Visit   ? ?  ? Cardiovascular and Mediastinum  ? Essential hypertension  ?  Blood pressu

## 2021-06-25 NOTE — Assessment & Plan Note (Signed)
Will issue Voltaren gel topically to the right shoulder 3-4 times daily and gave patient shoulder exercises ?

## 2021-06-25 NOTE — Assessment & Plan Note (Signed)
Counseled at reducing alcohol content ?

## 2021-06-25 NOTE — Progress Notes (Signed)
Patient has had candy today. ?Patient has had no medication. ?Patient noticed "knot" on the top of her right foot 1 week ago. Site is tender to touch. Patient reports no Hx of this concern. ?

## 2021-06-25 NOTE — Assessment & Plan Note (Signed)
Reassess A1c ?

## 2021-06-26 ENCOUNTER — Other Ambulatory Visit: Payer: Self-pay | Admitting: Critical Care Medicine

## 2021-06-26 ENCOUNTER — Telehealth: Payer: Self-pay

## 2021-06-26 LAB — LIPID PANEL
Chol/HDL Ratio: 4.4 ratio (ref 0.0–4.4)
Cholesterol, Total: 244 mg/dL — ABNORMAL HIGH (ref 100–199)
HDL: 56 mg/dL (ref >39)
LDL Chol Calc (NIH): 172 mg/dL — ABNORMAL HIGH (ref 0–99)
Triglycerides: 94 mg/dL (ref 0–149)
VLDL Cholesterol Cal: 16 mg/dL (ref 5–40)

## 2021-06-26 LAB — HEMOGLOBIN A1C
Est. average glucose Bld gHb Est-mCnc: 114 mg/dL
Hgb A1c MFr Bld: 5.6 % (ref 4.8–5.6)

## 2021-06-26 MED ORDER — ATORVASTATIN CALCIUM 10 MG PO TABS: 10.0000 mg | ORAL_TABLET | Freq: Every day | ORAL | 3 refills | Status: DC

## 2021-06-26 NOTE — Telephone Encounter (Signed)
-----   Message from Elsie Stain, MD sent at 06/26/2021  6:02 AM EDT ----- ?Let pt know cholesterol still too high, will order low dose atorvastatin to take daily sent to her [facility]  ?A1C NORMAL  no diabetes ?

## 2021-06-26 NOTE — Telephone Encounter (Signed)
Pt was called and is aware of results, DOB was confirmed.  ?

## 2021-07-16 ENCOUNTER — Encounter: Payer: Self-pay | Admitting: Podiatry

## 2021-07-16 ENCOUNTER — Ambulatory Visit (INDEPENDENT_AMBULATORY_CARE_PROVIDER_SITE_OTHER): Payer: BC Managed Care – PPO | Admitting: Podiatry

## 2021-07-16 DIAGNOSIS — L989 Disorder of the skin and subcutaneous tissue, unspecified: Secondary | ICD-10-CM

## 2021-07-16 DIAGNOSIS — Q666 Other congenital valgus deformities of feet: Secondary | ICD-10-CM

## 2021-07-16 DIAGNOSIS — Q828 Other specified congenital malformations of skin: Secondary | ICD-10-CM

## 2021-07-22 NOTE — Progress Notes (Signed)
?  Subjective:  ?Patient ID: Tasha Nguyen, female    DOB: Jun 04, 1955,  MRN: [id number] ? ?Chief Complaint  ?Patient presents with  ? Callouses  ? ? ?66 y.o. female presents with the above complaint.  Patient presents with left submetatarsal 2/5/1 hyperkeratotic lesion/porokeratotic lesion.  Painful to touch.  Has progressive gotten worse debrided to help.  She tries to wear orthotics.  She denies any other acute complaints. ? ? ?Review of Systems: Negative except as noted in the HPI. Denies N/V/F/Ch. ? ?Past Medical History:  ?Diagnosis Date  ? Colon polyps   ? ? ?Current Outpatient Medications:  ?  atorvastatin (LIPITOR) 10 MG tablet, Take 1 tablet (10 mg total) by mouth daily., Disp: 90 tablet, Rfl: 3 ?  diclofenac Sodium (VOLTAREN) 1 % GEL, Apply 2 g topically 4 (four) times daily. To right shoulder, Disp: 100 g, Rfl: 2 ? ?Social History  ? ?Tobacco Use  ?Smoking Status Never  ?Smokeless Tobacco Never  ? ? ?Allergies  ?Allergen Reactions  ? Hydrocodone-Acetaminophen   ?  Other reaction(s): Other (See Comments) ?unknown  ? Oxycodone Other (See Comments)  ?  Hallucinations ?Other reaction(s): Other (See Comments) ?Hallucinations  ? Oxycodone-Acetaminophen   ?  Other reaction(s): Other (See Comments) ?unknown  ? ?Objective:  ?There were no vitals filed for this visit. ?There is no height or weight on file to calculate BMI. ?Constitutional Well developed. ?Well nourished.  ?Vascular Dorsalis pedis pulses palpable bilaterally. ?Posterior tibial pulses palpable bilaterally. ?Capillary refill normal to all digits.  ?No cyanosis or clubbing noted. ?Pedal hair growth normal.  ?Neurologic Normal speech. ?Oriented to person, place, and time. ?Epicritic sensation to light touch grossly present bilaterally.  ?Dermatologic  hyperkeratotic lesion noted to left submetatarsal two as well as left lateral fifth and hallux digit.  No pinpoint bleeding noted.  No central nucleated core noted.  ?Orthopedic: Normal joint ROM without  pain or crepitus bilaterally. ?No visible deformities. ?No bony tenderness.  ? ?Radiographs: None ?Assessment:  ? ?1. Pes planovalgus   ?2. Benign skin lesion   ?3. Porokeratosis   ? ? ? ?Plan:  ?Patient was evaluated and treated and all questions answered. ? ?Left fifth digit and distal left submetatarsal t porokeratosis/benign skin lesion with underlying pes planovalgus foot structure ?-I explained patient the etiology of hyperkeratotic lesion/benign skin lesion various treatment options were extensively discussed.  This likely has to do the weightbearing distribution and tight shoes.  I discussed with her shoe gear modification and provided her with debridement of the hyperkeratotic lesion.  Patient states that she will make the shoe gear modification as we discussed as well as utilize toe protector. ?-Using chisel blade and a handle, the hyperkeratotic lesion was debrided down to healthy striated tissue.  No pinpoint bleeding noted.  No central nucleated core noted. ?-She states the orthotics helped a little bit continue wearing orthotics. ? ?No follow-ups on file. ?

## 2021-07-28 ENCOUNTER — Other Ambulatory Visit: Payer: Self-pay | Admitting: Critical Care Medicine

## 2021-07-28 NOTE — Telephone Encounter (Signed)
Medication Refill - Medication: atorvastatin (LIPITOR) 10 MG tablet ? ?Has the patient contacted their pharmacy? Yes.   ?(Agent: If no, request that the patient contact the pharmacy for the refill. If patient does not wish to contact the pharmacy document the reason why and proceed with request.) ?(Agent: If yes, when and what did the pharmacy advise?) ? ?Preferred Pharmacy (with phone number or street name):  ?[id number] [place] ?Phone: [phone]: [phone] ? ?Has the patient been seen for an appointment in the last year OR does the patient have an upcoming appointment? Yes.   ? ?Agent: Please be advised that RX refills may take up to 3 business days. We ask that you follow-up with your pharmacy. ? ?

## 2021-07-30 MED ORDER — ATORVASTATIN CALCIUM 10 MG PO TABS: 10.0000 mg | ORAL_TABLET | Freq: Every day | ORAL | 1 refills | Status: DC

## 2021-07-30 NOTE — Telephone Encounter (Signed)
Requested medication (s) are due for refill today: yes ? ?Requested medication (s) are on the active medication list: yes ? ?Last refill:  06/26/21 ? ?Future visit scheduled: no ? ?Notes to clinic:  Unable to refill per protocol due to Rx request too soon. Last refill 06/26/21 for 90,3 refills. ? ? ?  ?Requested Prescriptions  ?Pending Prescriptions Disp Refills  ? atorvastatin (LIPITOR) 10 MG tablet 90 tablet 3  ?  Sig: Take 1 tablet (10 mg total) by mouth daily.  ?  ? Cardiovascular:  Antilipid - Statins Failed - 07/28/2021  5:53 PM  ?  ?  Failed - Lipid Panel in normal range within the last 12 months  ?  Cholesterol, Total  ?Date Value Ref Range Status  ?06/25/2021 244 (H) 100 - 199 mg/dL Final  ? ?LDL Chol Calc (NIH)  ?Date Value Ref Range Status  ?06/25/2021 172 (H) 0 - 99 mg/dL Final  ? ?HDL  ?Date Value Ref Range Status  ?06/25/2021 56 >39 mg/dL Final  ? ?Triglycerides  ?Date Value Ref Range Status  ?06/25/2021 94 0 - 149 mg/dL Final  ? ?  ?  ?  Passed - Patient is not pregnant  ?  ?  Passed - Valid encounter within last 12 months  ?  Recent Outpatient Visits   ? ?      ? 1 month ago Mixed hyperlipidemia  ? [place] Elsie Stain, MD  ? 2 months ago Encounter for Papanicolaou smear for cervical cancer screening  ? [place], [place]  ? 3 months ago Urinary frequency  ? [place] Elsie Stain, MD  ? 7 months ago Encounter for health-related screening  ? [place] Elsie Stain, MD  ? 1 year ago Encounter for Papanicolaou smear for cervical cancer screening  ? [place], Vernia Buff, NP  ? ?  ?  ? ? ?  ?  ?  ? ? ?

## 2021-08-25 ENCOUNTER — Ambulatory Visit: Payer: Self-pay | Admitting: *Deleted

## 2021-08-25 NOTE — Telephone Encounter (Signed)
STOP LIPITOR   KEEP 5/25 APPT

## 2021-08-25 NOTE — Telephone Encounter (Signed)
Summary: lips trembling   Pt has had lips trembling 3 days, about 6-7 X a day. Also has body aches. Please advise        Chief Complaint: lip tremors and body aches and has been taking medication lipitor 10 mg  since 07/30/21 Symptoms: bottom lip noted with tremors 6-7 times a day and body aches feels like bones ache.  Frequency: 3 days  Pertinent Negatives: Patient denies chest pain no difficulty breathing, no rash no inability to walk.no cough no sore throat  Disposition: '[]'$ ED /'[]'$ Urgent Care (no appt availability in office) / '[x]'$ Appointment(In office/virtual)/ '[]'$  [facility]/ '[]'$ Home Care/ '[]'$ Refused Recommended Disposition /'[]'$ [facility]/ '[]'$  Follow-up with PCP Additional Notes:  Requesting a call back. Do you want patient to stop lipitor?  Appt 08/28/21. Patient awaiting a call back regarding medication     Reason for Disposition  [1] MILD or MODERATE muscle aches or pain AND [2] taking a statin medicine (a lipid or cholesterol lowering drug)  Answer Assessment - Initial Assessment Questions 1. ONSET: "When did the muscle aches or body pains start?"      3 days ago  2. LOCATION: "What part of your body is hurting?" (e.g., entire body, arms, legs)      Whole body and bottom lip tremors 3. SEVERITY: "How bad is the pain?" (Scale 1-10; or mild, moderate, severe)   - MILD (1-3): doesn't interfere with normal activities    - MODERATE (4-7): interferes with normal activities or awakens from sleep    - SEVERE (8-10):  excruciating pain, unable to do any normal activities      na 4. CAUSE: "What do you think is causing the pains?"     Not sure  5. FEVER: "Have you been having fever?"     no 6. OTHER SYMPTOMS: "Do you have any other symptoms?" (e.g., chest pain, weakness, rash, cold or flu symptoms, weight loss)     Bones ache bottom lip tremors 7. PREGNANCY: "Is there any chance you are pregnant?" "When was your last menstrual period?"     na 8. TRAVEL: "Have you  traveled out of the country in the last month?" (e.g., travel history, exposures)     na  Protocols used: Muscle Aches and Body Pain-A-AH

## 2021-08-26 NOTE — Telephone Encounter (Signed)
Called and pt and she is aware

## 2021-08-27 ENCOUNTER — Ambulatory Visit (INDEPENDENT_AMBULATORY_CARE_PROVIDER_SITE_OTHER): Payer: BC Managed Care – PPO | Admitting: Podiatry

## 2021-08-27 ENCOUNTER — Encounter: Payer: Self-pay | Admitting: Podiatry

## 2021-08-27 DIAGNOSIS — Q828 Other specified congenital malformations of skin: Secondary | ICD-10-CM

## 2021-08-27 NOTE — Progress Notes (Signed)
  Subjective:  Patient ID: Tasha Nguyen, female    DOB: 1956-03-16,  MRN: [id number]  Chief Complaint  Patient presents with   Callouses    66 y.o. female presents with the above complaint.  Patient presents with left submetatarsal 2/5/1 hyperkeratotic lesion/porokeratotic lesion.  Painful to touch.  Has progressive gotten worse debrided to help.  She said the orthotics helps occasionally but overall she tries to wear them as much as possible   Review of Systems: Negative except as noted in the HPI. Denies N/V/F/Ch.  Past Medical History:  Diagnosis Date   Colon polyps     Current Outpatient Medications:    atorvastatin (LIPITOR) 10 MG tablet, Take 1 tablet (10 mg total) by mouth daily., Disp: 90 tablet, Rfl: 1   diclofenac Sodium (VOLTAREN) 1 % GEL, Apply 2 g topically 4 (four) times daily. To right shoulder, Disp: 100 g, Rfl: 2  Social History   Tobacco Use  Smoking Status Never  Smokeless Tobacco Never    Allergies  Allergen Reactions   Hydrocodone-Acetaminophen     Other reaction(s): Other (See Comments) unknown   Oxycodone Other (See Comments)    Hallucinations Other reaction(s): Other (See Comments) Hallucinations   Oxycodone-Acetaminophen     Other reaction(s): Other (See Comments) unknown   Objective:  There were no vitals filed for this visit. There is no height or weight on file to calculate BMI. Constitutional Well developed. Well nourished.  Vascular Dorsalis pedis pulses palpable bilaterally. Posterior tibial pulses palpable bilaterally. Capillary refill normal to all digits.  No cyanosis or clubbing noted. Pedal hair growth normal.  Neurologic Normal speech. Oriented to person, place, and time. Epicritic sensation to light touch grossly present bilaterally.  Dermatologic  hyperkeratotic lesion noted to left submetatarsal two as well as left lateral fifth and hallux digit.  No pinpoint bleeding noted.  No central nucleated core noted.   Orthopedic: Normal joint ROM without pain or crepitus bilaterally. No visible deformities. No bony tenderness.   Radiographs: None Assessment:   No diagnosis found.    Plan:  Patient was evaluated and treated and all questions answered.  Left fifth digit and distal left submetatarsal t porokeratosis/benign skin lesion with underlying pes planovalgus foot structure -I explained patient the etiology of hyperkeratotic lesion/benign skin lesion various treatment options were extensively discussed.  This likely has to do the weightbearing distribution and tight shoes.  I discussed with her shoe gear modification and provided her with debridement of the hyperkeratotic lesion.  Patient states that she will make the shoe gear modification as we discussed as well as utilize toe protector. -Using chisel blade and a handle, the hyperkeratotic lesion was debrided down to healthy striated tissue.  No pinpoint bleeding noted.  No central nucleated core noted. -She states the orthotics helped a little bit continue wearing orthotics.  No follow-ups on file.

## 2021-08-28 ENCOUNTER — Encounter: Payer: Self-pay | Admitting: Physician Assistant

## 2021-08-28 ENCOUNTER — Ambulatory Visit: Payer: BC Managed Care – PPO | Attending: Physician Assistant | Admitting: Physician Assistant

## 2021-08-28 VITALS — BP 130/92 | HR 82 | Wt 135.8 lb

## 2021-08-28 DIAGNOSIS — R7989 Other specified abnormal findings of blood chemistry: Secondary | ICD-10-CM

## 2021-08-28 DIAGNOSIS — T887XXA Unspecified adverse effect of drug or medicament, initial encounter: Secondary | ICD-10-CM | POA: Diagnosis not present

## 2021-08-28 DIAGNOSIS — R253 Fasciculation: Secondary | ICD-10-CM

## 2021-08-28 NOTE — Progress Notes (Signed)
Patient ID: Tasha Nguyen, female   DOB: Apr 29, 1955, 66 y.o.   MRN: [id number]   Tasha Nguyen, is a 66 y.o. female  [id number]:[id number]  NKN:[id number]  DOB - Jul 23, 1955  No chief complaint on file.      Subjective:   Tasha Nguyen is a 66 y.o. female here today for a follow up visit after stopping lipitor.  She started having lip twitching and muscle/body aches while she was taking lipitor so it was stopped about 1 week ago.  She thinks the muscle twitching is improving.  She also thinks the aching may be less as well.    No problems updated.  ALLERGIES: Allergies  Allergen Reactions   Hydrocodone-Acetaminophen     Other reaction(s): Other (See Comments) unknown   Oxycodone Other (See Comments)    Hallucinations Other reaction(s): Other (See Comments) Hallucinations   Oxycodone-Acetaminophen     Other reaction(s): Other (See Comments) unknown    PAST MEDICAL HISTORY: Past Medical History:  Diagnosis Date   Colon polyps     MEDICATIONS AT HOME: Prior to Admission medications   Medication Sig Start Date End Date Taking? Authorizing Provider  diclofenac Sodium (VOLTAREN) 1 % GEL Apply 2 g topically 4 (four) times daily. To right shoulder 06/25/21  Yes Elsie Stain, MD  atorvastatin (LIPITOR) 10 MG tablet Take 1 tablet (10 mg total) by mouth daily. Patient not taking: Reported on 08/28/2021 07/30/21   Elsie Stain, MD    ROS: Neg HEENT Neg resp Neg cardiac Neg GI Neg GU Neg MS Neg psych Neg neuro  Objective:   Vitals:   08/28/21 1613  BP: (!) 130/92  Pulse: 82  SpO2: 100%  Weight: 135 lb 12.8 oz (61.6 kg)   Exam General appearance : Awake, alert, not in any distress. Speech Clear. Not toxic looking HEENT: Atraumatic and Normocephalic Neck: Supple, no JVD. No cervical lymphadenopathy.  Chest: Good air entry bilaterally, CTAB.  No rales/rhonchi/wheezing CVS: S1 S2 regular, no murmurs.  Extremities: B/L Lower Ext shows no edema, both legs are warm to  touch Neurology: Awake alert, and oriented X 3, CN II-XII intact, Non focal.  Facial symmetry is intact Skin: No Rash  Data Review Lab Results  Component Value Date   HGBA1C 5.6 06/25/2021   HGBA1C 5.7 (H) 12/17/2020    Assessment & Plan   1. Fasciculations - Thyroid Panel With TSH - Comprehensive metabolic panel  2. Abnormal TSH D abnormal tsh in the fall - Thyroid Panel With TSH - Comprehensive metabolic panel  3. Medication side effect Continue off atorvastain    Return if symptoms worsen or fail to improve.  The patient was given clear instructions to go to ER or return to medical center if symptoms don't improve, worsen or new problems develop. The patient verbalized understanding. The patient was told to call to get lab results if they haven't heard anything in the next week.      Freeman Caldron, PA-C [facility] and [place], [place]   08/28/2021, 4:28 PM

## 2021-08-29 LAB — COMPREHENSIVE METABOLIC PANEL
ALT: 16 IU/L (ref 0–32)
AST: 18 IU/L (ref 0–40)
Albumin/Globulin Ratio: 1.8 (ref 1.2–2.2)
Albumin: 4.4 g/dL (ref 3.8–4.8)
Alkaline Phosphatase: 73 IU/L (ref 44–121)
BUN/Creatinine Ratio: 20 (ref 12–28)
BUN: 12 mg/dL (ref 8–27)
Bilirubin Total: 0.3 mg/dL (ref 0.0–1.2)
CO2: 18 mmol/L — ABNORMAL LOW (ref 20–29)
Calcium: 9.6 mg/dL (ref 8.7–10.3)
Chloride: 105 mmol/L (ref 96–106)
Creatinine, Ser: 0.61 mg/dL (ref 0.57–1.00)
Globulin, Total: 2.5 g/dL (ref 1.5–4.5)
Glucose: 90 mg/dL (ref 70–99)
Potassium: 4.3 mmol/L (ref 3.5–5.2)
Sodium: 141 mmol/L (ref 134–144)
Total Protein: 6.9 g/dL (ref 6.0–8.5)
eGFR: 100 mL/min/{1.73_m2} (ref >59)

## 2021-08-29 LAB — THYROID PANEL WITH TSH
Free Thyroxine Index: 2.1 (ref 1.2–4.9)
T3 Uptake Ratio: 33 % (ref 24–39)
T4, Total: 6.3 ug/dL (ref 4.5–12.0)
TSH: 0.782 u[IU]/mL (ref 0.450–4.500)

## 2021-09-19 ENCOUNTER — Ambulatory Visit: Payer: BC Managed Care – PPO | Admitting: Podiatry

## 2021-12-11 ENCOUNTER — Ambulatory Visit (INDEPENDENT_AMBULATORY_CARE_PROVIDER_SITE_OTHER): Payer: BC Managed Care – PPO | Admitting: Podiatry

## 2021-12-11 ENCOUNTER — Encounter: Payer: Self-pay | Admitting: Podiatry

## 2021-12-11 DIAGNOSIS — Q828 Other specified congenital malformations of skin: Secondary | ICD-10-CM | POA: Diagnosis not present

## 2021-12-11 NOTE — Progress Notes (Signed)
  Subjective:  Patient ID: Tasha Nguyen, female    DOB: 10-30-1955,  MRN: [id number]  Chief Complaint  Patient presents with   Callouses    66 y.o. female presents with the above complaint.  Patient presents with left submetatarsal 2/5/1 hyperkeratotic lesion/porokeratotic lesion.  Painful to touch.  Has progressive gotten worse debrided to help.   Review of Systems: Negative except as noted in the HPI. Denies N/V/F/Ch.  Past Medical History:  Diagnosis Date   Colon polyps     Current Outpatient Medications:    atorvastatin (LIPITOR) 10 MG tablet, Take 1 tablet (10 mg total) by mouth daily. (Patient not taking: Reported on 08/28/2021), Disp: 90 tablet, Rfl: 1   diclofenac Sodium (VOLTAREN) 1 % GEL, Apply 2 g topically 4 (four) times daily. To right shoulder, Disp: 100 g, Rfl: 2  Social History   Tobacco Use  Smoking Status Never  Smokeless Tobacco Never    Allergies  Allergen Reactions   Hydrocodone-Acetaminophen     Other reaction(s): Other (See Comments) unknown   Oxycodone Other (See Comments)    Hallucinations Other reaction(s): Other (See Comments) Hallucinations   Oxycodone-Acetaminophen     Other reaction(s): Other (See Comments) unknown   Objective:  There were no vitals filed for this visit. There is no height or weight on file to calculate BMI. Constitutional Well developed. Well nourished.  Vascular Dorsalis pedis pulses palpable bilaterally. Posterior tibial pulses palpable bilaterally. Capillary refill normal to all digits.  No cyanosis or clubbing noted. Pedal hair growth normal.  Neurologic Normal speech. Oriented to person, place, and time. Epicritic sensation to light touch grossly present bilaterally.  Dermatologic  hyperkeratotic lesion noted to left submetatarsal two as well as left lateral fifth and hallux digit.  No pinpoint bleeding noted.  No central nucleated core noted.  Orthopedic: Normal joint ROM without pain or crepitus  bilaterally. No visible deformities. No bony tenderness.   Radiographs: None Assessment:   No diagnosis found.    Plan:  Patient was evaluated and treated and all questions answered.  Left fifth digit and distal left submetatarsal t porokeratosis/benign skin lesion with underlying pes planovalgus foot structure -I explained patient the etiology of hyperkeratotic lesion/benign skin lesion various treatment options were extensively discussed.  This likely has to do the weightbearing distribution and tight shoes.  I discussed with her shoe gear modification and provided her with debridement of the hyperkeratotic lesion.  Patient states that she will make the shoe gear modification as we discussed as well as utilize toe protector. -Using chisel blade and a handle, the hyperkeratotic lesion was debrided down to healthy striated tissue.  No pinpoint bleeding noted.  No central nucleated core noted. -Orthotics indefinitely    No follow-ups on file.

## 2021-12-12 ENCOUNTER — Telehealth: Payer: Self-pay

## 2021-12-12 NOTE — Telephone Encounter (Signed)
Called patient and left voicemail for nurse visit bp check

## 2021-12-16 ENCOUNTER — Telehealth: Payer: Self-pay

## 2021-12-16 ENCOUNTER — Ambulatory Visit: Payer: BC Managed Care – PPO | Attending: Critical Care Medicine

## 2021-12-16 VITALS — BP 146/85 | HR 67

## 2021-12-16 DIAGNOSIS — Z0130 Encounter for examination of blood pressure without abnormal findings: Secondary | ICD-10-CM

## 2021-12-16 MED ORDER — CHLORTHALIDONE 25 MG PO TABS: 25.0000 mg | ORAL_TABLET | Freq: Every day | ORAL | 2 refills | Status: DC

## 2021-12-16 MED ORDER — AMLODIPINE BESYLATE 10 MG PO TABS: 10.0000 mg | ORAL_TABLET | Freq: Every day | ORAL | 1 refills | Status: DC

## 2021-12-16 NOTE — Telephone Encounter (Signed)
Routing to CMA 

## 2021-12-16 NOTE — Progress Notes (Signed)
Pt arrived for BP check BP has been sent to PCP

## 2021-12-16 NOTE — Telephone Encounter (Signed)
Call pt and tell her I sent two rx to her [facility] for BP meds   Amlodipine '10mg'$  daily and chlorthalidone '25mg'$  daily  Keep her appt with me in October

## 2021-12-16 NOTE — Telephone Encounter (Signed)
BP today at nurse visit 146/85

## 2021-12-18 NOTE — Telephone Encounter (Signed)
Called and left voice mail

## 2022-01-14 ENCOUNTER — Ambulatory Visit: Payer: BC Managed Care – PPO | Attending: Critical Care Medicine | Admitting: Critical Care Medicine

## 2022-01-14 ENCOUNTER — Encounter: Payer: Self-pay | Admitting: Critical Care Medicine

## 2022-01-14 VITALS — BP 127/84 | HR 80 | Temp 98.1°F | Ht 69.0 in | Wt 136.8 lb

## 2022-01-14 DIAGNOSIS — K219 Gastro-esophageal reflux disease without esophagitis: Secondary | ICD-10-CM | POA: Diagnosis not present

## 2022-01-14 DIAGNOSIS — E782 Mixed hyperlipidemia: Secondary | ICD-10-CM

## 2022-01-14 DIAGNOSIS — I10 Essential (primary) hypertension: Secondary | ICD-10-CM | POA: Diagnosis not present

## 2022-01-14 DIAGNOSIS — L84 Corns and callosities: Secondary | ICD-10-CM | POA: Diagnosis not present

## 2022-01-14 DIAGNOSIS — M25511 Pain in right shoulder: Secondary | ICD-10-CM | POA: Diagnosis not present

## 2022-01-14 DIAGNOSIS — Z23 Encounter for immunization: Secondary | ICD-10-CM | POA: Diagnosis not present

## 2022-01-14 DIAGNOSIS — Z789 Other specified health status: Secondary | ICD-10-CM

## 2022-01-14 MED ORDER — AMLODIPINE BESYLATE 10 MG PO TABS: 10.0000 mg | ORAL_TABLET | Freq: Every day | ORAL | 1 refills | Status: DC

## 2022-01-14 MED ORDER — ATORVASTATIN CALCIUM 20 MG PO TABS: 20.0000 mg | ORAL_TABLET | Freq: Every day | ORAL | 1 refills | Status: DC

## 2022-01-14 MED ORDER — PANTOPRAZOLE SODIUM 40 MG PO TBEC: 40.0000 mg | DELAYED_RELEASE_TABLET | Freq: Every day | ORAL | 3 refills | Status: DC

## 2022-01-14 MED ORDER — DICLOFENAC SODIUM 1 % EX GEL: 2.0000 g | Freq: Four times a day (QID) | CUTANEOUS | 2 refills | Status: DC

## 2022-01-14 MED ORDER — DEXTROMETHORPHAN POLISTIREX ER 30 MG/5ML PO SUER: 30.0000 mg | ORAL | 0 refills | Status: DC | PRN

## 2022-01-14 NOTE — Assessment & Plan Note (Addendum)
Blood pressure well controlled at present continue amlodipine and stay off chlorthalidone

## 2022-01-14 NOTE — Progress Notes (Signed)
Complete physical exam  Patient: Tasha Nguyen   DOB: 1956/03/31   66 y.o. Female  MRN: [id number]  Subjective:    Chief Complaint  Patient presents with   Annual Exam    CPE. Med refill.  Yes to flu vax.    Tasha Nguyen is a 66 y.o. female who presents today for a complete physical exam. She reports consuming a  salads fruit  diet.  Not much exercise  She generally feels well. She reports sleeping well. She does have additional problems to discuss today.  Patient is still drinking about 4 beers a day she has also a chronic cough this been going on for about 2 weeks.  She denies any other respiratory complaints.  She saw podiatrist 4 weeks ago but is still having foot pain.  She had a callus removed.  She is no longer smoking.  On arrival blood pressure is good 127/84.  She agrees to and did receive the flu vaccine.  Patient cannot afford chlorthalidone she is on amlodipine alone doing well Patient has left shoulder pain as well and needs a refill on her Voltaren gel    Most recent fall risk assessment:    01/14/2022    9:01 AM  [place] in the past year? 0     Most recent depression screenings:    01/14/2022    9:02 AM 08/28/2021    4:16 PM  PHQ 2/9 Scores  PHQ - 2 Score 0 0    Vision:Within last year and Dental: No current dental problems  Patient Active Problem List   Diagnosis Date Noted   Gastroesophageal reflux disease without esophagitis 01/14/2022   Prediabetes 06/25/2021   Right shoulder pain 06/25/2021   Vision changes 04/21/2021   Hammer toe 04/21/2021   History of colonic polyps 02/01/2020   PTSD (post-traumatic stress disorder) 11/23/2019   Generalized anxiety disorder 11/23/2019   Psychophysiological insomnia 11/23/2019   Alcohol use 10/23/2019   Callus 10/23/2019   Mixed hyperlipidemia 08/16/2008   Essential hypertension 08/16/2008   Past Medical History:  Diagnosis Date   Colon polyps    Past Surgical History:  Procedure  Laterality Date   none     Social History   Tobacco Use   Smoking status: Never   Smokeless tobacco: Never  Vaping Use   Vaping Use: Never used  Substance Use Topics   Alcohol use: Yes    Comment: weekends-4-5 beers   Drug use: Yes    Types: Marijuana    Comment: occ   Social History   Socioeconomic History   Marital status: Single    Spouse name: Not on file   Number of children: Not on file   Years of education: Not on file   Highest education level: Not on file  Occupational History   Not on file  Tobacco Use   Smoking status: Never   Smokeless tobacco: Never  Vaping Use   Vaping Use: Never used  Substance and Sexual Activity   Alcohol use: Yes    Comment: weekends-4-5 beers   Drug use: Yes    Types: Marijuana    Comment: occ   Sexual activity: Yes    Birth control/protection: None  Other Topics Concern   Not on file  Social History Narrative   Not on file   Social Determinants of Health   Financial Resource Strain: Not on file  Food Insecurity: Not on file  Transportation Needs: Not on file  Physical Activity: Not on file  Stress: Not on file  Social Connections: Not on file  Intimate Partner Violence: Not on file   Family Status  Relation Name Status   Mother  (Not Specified)   Father  (Not Specified)   Sister  (Not Specified)   Sister  (Not Specified)   Neg Hx  (Not Specified)   Family History  Problem Relation Age of Onset   Diabetes Mother    CAD Father    CAD Sister    Diabetes Sister    Diabetes Sister    Colon cancer Neg Hx    Esophageal cancer Neg Hx    Stomach cancer Neg Hx    Pancreatic cancer Neg Hx    Liver disease Neg Hx    Rectal cancer Neg Hx    Breast cancer Neg Hx    Allergies  Allergen Reactions   Hydrocodone-Acetaminophen     Other reaction(s): Other (See Comments) unknown   Oxycodone Other (See Comments)    Hallucinations Other reaction(s): Other (See Comments) Hallucinations   Oxycodone-Acetaminophen      Other reaction(s): Other (See Comments) unknown      Patient Care Team: Elsie Stain, MD as PCP - General (Pulmonary Disease)   Outpatient Medications Prior to Visit  Medication Sig   [DISCONTINUED] amLODipine (NORVASC) 10 MG tablet Take 1 tablet (10 mg total) by mouth daily.   [DISCONTINUED] atorvastatin (LIPITOR) 10 MG tablet Take 1 tablet (10 mg total) by mouth daily. (Patient not taking: Reported on 08/28/2021)   [DISCONTINUED] chlorthalidone (HYGROTON) 25 MG tablet Take 1 tablet (25 mg total) by mouth daily. (Patient not taking: Reported on 01/14/2022)   [DISCONTINUED] diclofenac Sodium (VOLTAREN) 1 % GEL Apply 2 g topically 4 (four) times daily. To right shoulder (Patient not taking: Reported on 01/14/2022)   No facility-administered medications prior to visit.    Review of Systems  Constitutional:  Negative for chills, diaphoresis, fever, malaise/fatigue and weight loss.  HENT:  Negative for congestion, ear discharge, ear pain, hearing loss, nosebleeds, sore throat and tinnitus.   Eyes:  Negative for blurred vision, photophobia and redness.  Respiratory:  Positive for cough and sputum production. Negative for hemoptysis, shortness of breath, wheezing and stridor.        Sputum white  Cardiovascular:  Negative for chest pain, palpitations, orthopnea, claudication, leg swelling and PND.  Gastrointestinal:  Negative for abdominal pain, blood in stool, constipation, diarrhea, heartburn, nausea and vomiting.  Genitourinary:  Negative for dysuria, flank pain, frequency, hematuria and urgency.  Musculoskeletal:  Positive for joint pain. Negative for back pain, falls, myalgias and neck pain.  Skin:  Negative for itching and rash.  Neurological:  Negative for dizziness, tingling, tremors, sensory change, speech change, focal weakness, seizures, loss of consciousness, weakness and headaches.  Endo/Heme/Allergies:  Negative for environmental allergies and polydipsia. Does not  bruise/bleed easily.  Psychiatric/Behavioral:  Negative for depression, memory loss, substance abuse and suicidal ideas. The patient is not nervous/anxious and does not have insomnia.           Objective:     BP 127/84 (BP Location: Left Arm, Patient Position: Sitting, Cuff Size: Normal)   Pulse 80   Temp 98.1 F (36.7 C) (Oral)   Ht _0  (1.753 m)   Wt 136 lb 12.8 oz (62.1 kg)   SpO2 98%   BMI 20.20 kg/m  BP Readings from Last 3 Encounters:  01/14/22 127/84  12/16/21 (!) 146/85  08/28/21 (!) 130/92  Wt Readings from Last 3 Encounters:  01/14/22 136 lb 12.8 oz (62.1 kg)  08/28/21 135 lb 12.8 oz (61.6 kg)  06/25/21 135 lb (61.2 kg)      Physical Exam Vitals reviewed.  Constitutional:      Appearance: Normal appearance. She is well-developed. She is not diaphoretic.  HENT:     Head: Normocephalic and atraumatic.     Nose: No nasal deformity, septal deviation, mucosal edema or rhinorrhea.     Right Sinus: No maxillary sinus tenderness or frontal sinus tenderness.     Left Sinus: No maxillary sinus tenderness or frontal sinus tenderness.     Mouth/Throat:     Pharynx: No oropharyngeal exudate.  Eyes:     General: No scleral icterus.    Conjunctiva/sclera: Conjunctivae normal.     Pupils: Pupils are equal, round, and reactive to light.  Neck:     Thyroid: No thyromegaly.     Vascular: No carotid bruit or JVD.     Trachea: Trachea normal. No tracheal tenderness or tracheal deviation.  Cardiovascular:     Rate and Rhythm: Normal rate and regular rhythm.     Chest Wall: PMI is not displaced.     Pulses: Normal pulses. No decreased pulses.     Heart sounds: Normal heart sounds, S1 normal and S2 normal. Heart sounds not distant. No murmur heard.    No systolic murmur is present.     No diastolic murmur is present.     No friction rub. No gallop. No S3 or S4 sounds.  Pulmonary:     Effort: No tachypnea, accessory muscle usage or respiratory distress.     Breath  sounds: No stridor. No decreased breath sounds, wheezing, rhonchi or rales.  Chest:     Chest wall: No tenderness.  Abdominal:     General: Bowel sounds are normal. There is no distension.     Palpations: Abdomen is soft. Abdomen is not rigid.     Tenderness: There is no abdominal tenderness. There is no guarding or rebound.  Musculoskeletal:        General: Normal range of motion.     Cervical back: Normal range of motion and neck supple. No edema, erythema or rigidity. No muscular tenderness. Normal range of motion.  Lymphadenopathy:     Head:     Right side of head: No submental or submandibular adenopathy.     Left side of head: No submental or submandibular adenopathy.     Cervical: No cervical adenopathy.  Skin:    General: Skin is warm and dry.     Coloration: Skin is not pale.     Findings: No rash.     Nails: There is no clubbing.  Neurological:     Mental Status: She is alert and oriented to person, place, and time.     Sensory: No sensory deficit.  Psychiatric:        Speech: Speech normal.        Behavior: Behavior normal.      No results found for any visits on 01/14/22. Last CBC Lab Results  Component Value Date   WBC 5.1 12/17/2020   HGB 14.3 12/17/2020   HCT 43.1 12/17/2020   MCV 96 12/17/2020   MCH 31.7 12/17/2020   RDW 13.0 12/17/2020   PLT 380 63/14/9702   Last metabolic panel Lab Results  Component Value Date   GLUCOSE 90 08/28/2021   NA 141 08/28/2021   K 4.3 08/28/2021   CL  105 08/28/2021   CO2 18 (L) 08/28/2021   BUN 12 08/28/2021   CREATININE 0.61 08/28/2021   EGFR 100 08/28/2021   CALCIUM 9.6 08/28/2021   PROT 6.9 08/28/2021   ALBUMIN 4.4 08/28/2021   LABGLOB 2.5 08/28/2021   AGRATIO 1.8 08/28/2021   BILITOT 0.3 08/28/2021   ALKPHOS 73 08/28/2021   AST 18 08/28/2021   ALT 16 08/28/2021   ANIONGAP 9 05/22/2019   Last lipids Lab Results  Component Value Date   CHOL 244 (H) 06/25/2021   HDL 56 06/25/2021   LDLCALC 172 (H)  06/25/2021   TRIG 94 06/25/2021   CHOLHDL 4.4 06/25/2021        Assessment & Plan:    Routine Health Maintenance and Physical Exam  Immunization History  Administered Date(s) Administered   Influenza,inj,Quad PF,6+ Mos 12/17/2020, 01/14/2022   PFIZER(Purple Top)SARS-COV-2 Vaccination 07/06/2019, 09/07/2019   PNEUMOCOCCAL CONJUGATE-20 04/24/2021   Pneumococcal Polysaccharide-23 08/16/2008   Td 04/07/2007   Tdap 10/23/2019    Health Maintenance  Topic Date Due   Zoster Vaccines- Shingrix (1 of 2) Never done   COVID-19 Vaccine (3 - Pfizer series) 11/02/2019   MAMMOGRAM  02/27/2023   COLONOSCOPY (Pts 45-66yr Insurance coverage will need to be confirmed)  03/08/2025   PAP SMEAR-Modifier  05/15/2026   TETANUS/TDAP  10/22/2029   INFLUENZA VACCINE  Completed   Hepatitis C Screening  Completed   HIV Screening  Completed   HPV VACCINES  Aged Out    Discussed health benefits of physical activity, and encouraged her to engage in regular exercise appropriate for her age and condition.  Problem List Items Addressed This Visit       Cardiovascular and Mediastinum   Essential hypertension    Blood pressure well controlled at present continue amlodipine and stay off chlorthalidone      Relevant Medications   amLODipine (NORVASC) 10 MG tablet   atorvastatin (LIPITOR) 20 MG tablet     Digestive   Gastroesophageal reflux disease without esophagitis - Primary    Suspect cough is due to reflux disease will begin pantoprazole and check H. pylori breath test and patient advised to reduce alcohol intake      Relevant Medications   pantoprazole (PROTONIX) 40 MG tablet   Other Relevant Orders   H. pylori breath test     Musculoskeletal and Integument   Callus    Patient asked to call podiatry again for another visit        Other   Mixed hyperlipidemia    Continue statins      Relevant Medications   amLODipine (NORVASC) 10 MG tablet   atorvastatin (LIPITOR) 20 MG tablet    Alcohol use    Counseled regarding alcohol use      Right shoulder pain    Continue with Voltaren gel topically      Other Visit Diagnoses     Need for immunization against influenza       Relevant Orders   Flu Vaccine QUAD 655moM (Fluarix, Fluzone & Alfiuria Quad PF) (Completed)      Return in about 3 months (around 04/16/2022) for htn, gastritis.     PaAsencion NobleMD

## 2022-01-14 NOTE — Assessment & Plan Note (Signed)
Continue with Voltaren gel topically

## 2022-01-14 NOTE — Assessment & Plan Note (Signed)
-  Continue statins ?

## 2022-01-14 NOTE — Assessment & Plan Note (Signed)
Patient asked to call podiatry again for another visit

## 2022-01-14 NOTE — Patient Instructions (Addendum)
Atorvastatin sent to your pharmacy 20 mg daily for cholesterol and stay on the amlodipine refill sent  Refill sent on diclofenac gel for your left shoulder  Begin pantoprazole daily 15 minutes before you eat in the morning for acid  Take Delsym over-the-counter cough syrup 1-2 times daily to suppress cough  Go back to the foot doctor to be reassessed for your callus  Flu vaccine given  Follow reflux diet as attached  Reduce your alcohol intake to only 1 or 2 beers every other day instead of the 4 beers daily  Labs today will be an H. pylori breath test to look for bacteria in the stomach that may be causing the cough  Return to see Tasha Nguyen 3 months

## 2022-01-14 NOTE — Assessment & Plan Note (Signed)
Counseled regarding alcohol use

## 2022-01-14 NOTE — Assessment & Plan Note (Addendum)
Suspect cough is due to reflux disease will begin pantoprazole and check H. pylori breath test and patient advised to reduce alcohol intake

## 2022-01-16 ENCOUNTER — Telehealth: Payer: Self-pay

## 2022-01-16 LAB — H. PYLORI BREATH TEST: H pylori Breath Test: NEGATIVE

## 2022-01-16 NOTE — Progress Notes (Signed)
Let pt know no bacteria is stomach to treat as a cause of upset stomach needs to reduce alcohol content

## 2022-01-16 NOTE — Telephone Encounter (Signed)
-----   Message from Elsie Stain, MD sent at 01/16/2022  7:42 AM EDT ----- Let pt know no bacteria is stomach to treat as a cause of upset stomach needs to reduce alcohol content

## 2022-01-16 NOTE — Telephone Encounter (Signed)
Pt was called and vm was left, Information has been sent to nurse pool.    Letter sent

## 2022-04-10 ENCOUNTER — Ambulatory Visit: Payer: BC Managed Care – PPO | Admitting: Internal Medicine

## 2022-04-15 ENCOUNTER — Ambulatory Visit: Payer: BC Managed Care – PPO | Admitting: Critical Care Medicine

## 2022-05-06 ENCOUNTER — Ambulatory Visit (INDEPENDENT_AMBULATORY_CARE_PROVIDER_SITE_OTHER): Payer: BLUE CROSS/BLUE SHIELD | Admitting: Podiatry

## 2022-05-06 DIAGNOSIS — Q828 Other specified congenital malformations of skin: Secondary | ICD-10-CM

## 2022-05-06 NOTE — Progress Notes (Signed)
  Subjective:  Patient ID: Tasha Nguyen, female    DOB: 10/30/1955,  MRN: [id number]  Chief Complaint  Patient presents with   Foot Problem    Nail trim and callouses on left foot, ball of foot     67 y.o. female presents with the above complaint.  Patient presents with left submetatarsal 2/5/1 hyperkeratotic lesion/porokeratotic lesion.  Painful to touch.  Has progressive gotten worse debrided to help.   Review of Systems: Negative except as noted in the HPI. Denies N/V/F/Ch.  Past Medical History:  Diagnosis Date   Colon polyps     Current Outpatient Medications:    amLODipine (NORVASC) 10 MG tablet, Take 1 tablet (10 mg total) by mouth daily., Disp: 90 tablet, Rfl: 1   atorvastatin (LIPITOR) 20 MG tablet, Take 1 tablet (20 mg total) by mouth daily., Disp: 90 tablet, Rfl: 1   dextromethorphan (DELSYM) 30 MG/5ML liquid, Take 5 mLs (30 mg total) by mouth as needed for cough., Disp: 89 mL, Rfl: 0   diclofenac Sodium (VOLTAREN) 1 % GEL, Apply 2 g topically 4 (four) times daily. To right shoulder, Disp: 100 g, Rfl: 2   pantoprazole (PROTONIX) 40 MG tablet, Take 1 tablet (40 mg total) by mouth daily., Disp: 60 tablet, Rfl: 3  Social History   Tobacco Use  Smoking Status Never  Smokeless Tobacco Never    Allergies  Allergen Reactions   Hydrocodone-Acetaminophen     Other reaction(s): Other (See Comments) unknown   Oxycodone Other (See Comments)    Hallucinations Other reaction(s): Other (See Comments) Hallucinations   Oxycodone-Acetaminophen     Other reaction(s): Other (See Comments) unknown   Objective:  There were no vitals filed for this visit. There is no height or weight on file to calculate BMI. Constitutional Well developed. Well nourished.  Vascular Dorsalis pedis pulses palpable bilaterally. Posterior tibial pulses palpable bilaterally. Capillary refill normal to all digits.  No cyanosis or clubbing noted. Pedal hair growth normal.  Neurologic Normal  speech. Oriented to person, place, and time. Epicritic sensation to light touch grossly present bilaterally.  Dermatologic  hyperkeratotic lesion noted to left submetatarsal two as well as left lateral fifth and hallux digit.  No pinpoint bleeding noted.  No central nucleated core noted.  Orthopedic: Normal joint ROM without pain or crepitus bilaterally. No visible deformities. No bony tenderness.   Radiographs: None Assessment:   No diagnosis found.    Plan:  Patient was evaluated and treated and all questions answered.  Left fifth digit and distal left submetatarsal t porokeratosis/benign skin lesion with underlying pes planovalgus foot structure -I explained patient the etiology of hyperkeratotic lesion/benign skin lesion various treatment options were extensively discussed.  This likely has to do the weightbearing distribution and tight shoes.  I discussed with her shoe gear modification and provided her with debridement of the hyperkeratotic lesion.  Patient states that she will make the shoe gear modification as we discussed as well as utilize toe protector. -Using chisel blade and a handle, the hyperkeratotic lesion was debrided down to healthy striated tissue.  No pinpoint bleeding noted.  No central nucleated core noted. -Orthotics indefinitely    No follow-ups on file.

## 2022-09-11 ENCOUNTER — Ambulatory Visit (INDEPENDENT_AMBULATORY_CARE_PROVIDER_SITE_OTHER): Payer: BLUE CROSS/BLUE SHIELD | Admitting: Podiatry

## 2022-09-11 ENCOUNTER — Encounter: Payer: Self-pay | Admitting: Podiatry

## 2022-09-11 DIAGNOSIS — Q828 Other specified congenital malformations of skin: Secondary | ICD-10-CM | POA: Diagnosis not present

## 2022-09-11 NOTE — Progress Notes (Signed)
  Subjective:  Patient ID: Tasha Nguyen, female    DOB: 10/21/1955,  MRN: [id number]  Chief Complaint  Patient presents with   Callouses    RM 6 Left foot callus debridement.     67 y.o. female presents with the above complaint.  Patient presents with left submetatarsal 2/5/1 hyperkeratotic lesion/porokeratotic lesion.  Painful to touch.  Has progressive gotten worse debrided to help.  She said the orthotics helps occasionally but overall she tries to wear them as much as possible   Review of Systems: Negative except as noted in the HPI. Denies N/V/F/Ch.  Past Medical History:  Diagnosis Date   Colon polyps     Current Outpatient Medications:    amLODipine (NORVASC) 10 MG tablet, Take 1 tablet (10 mg total) by mouth daily., Disp: 90 tablet, Rfl: 1   atorvastatin (LIPITOR) 20 MG tablet, Take 1 tablet (20 mg total) by mouth daily., Disp: 90 tablet, Rfl: 1   dextromethorphan (DELSYM) 30 MG/5ML liquid, Take 5 mLs (30 mg total) by mouth as needed for cough., Disp: 89 mL, Rfl: 0   diclofenac Sodium (VOLTAREN) 1 % GEL, Apply 2 g topically 4 (four) times daily. To right shoulder, Disp: 100 g, Rfl: 2   pantoprazole (PROTONIX) 40 MG tablet, Take 1 tablet (40 mg total) by mouth daily., Disp: 60 tablet, Rfl: 3  Social History   Tobacco Use  Smoking Status Never  Smokeless Tobacco Never    Allergies  Allergen Reactions   Hydrocodone-Acetaminophen     Other reaction(s): Other (See Comments) unknown   Oxycodone Other (See Comments)    Hallucinations Other reaction(s): Other (See Comments) Hallucinations   Oxycodone-Acetaminophen     Other reaction(s): Other (See Comments) unknown   Objective:  There were no vitals filed for this visit. There is no height or weight on file to calculate BMI. Constitutional Well developed. Well nourished.  Vascular Dorsalis pedis pulses palpable bilaterally. Posterior tibial pulses palpable bilaterally. Capillary refill normal to all digits.   No cyanosis or clubbing noted. Pedal hair growth normal.  Neurologic Normal speech. Oriented to person, place, and time. Epicritic sensation to light touch grossly present bilaterally.  Dermatologic  hyperkeratotic lesion noted to left submetatarsal two as well as left lateral fifth and hallux digit.  No pinpoint bleeding noted.  No central nucleated core noted.  Orthopedic: Normal joint ROM without pain or crepitus bilaterally. No visible deformities. No bony tenderness.   Radiographs: None Assessment:   1. Porokeratosis       Plan:  Patient was evaluated and treated and all questions answered.  Left fifth digit and distal left submetatarsal t porokeratosis/benign skin lesion with underlying pes planovalgus foot structure -I explained patient the etiology of hyperkeratotic lesion/benign skin lesion various treatment options were extensively discussed.  This likely has to do the weightbearing distribution and tight shoes.  I discussed with her shoe gear modification and provided her with debridement of the hyperkeratotic lesion.  Patient states that she will make the shoe gear modification as we discussed as well as utilize toe protector. -Using chisel blade and a handle, the hyperkeratotic lesion was debrided down to healthy striated tissue.  No pinpoint bleeding noted.  No central nucleated core noted. -She states the orthotics helped a little bit continue wearing orthotics.  No follow-ups on file.

## 2022-09-23 ENCOUNTER — Other Ambulatory Visit: Payer: Self-pay

## 2022-09-23 ENCOUNTER — Ambulatory Visit (HOSPITAL_COMMUNITY)
Admission: EM | Admit: 2022-09-23 | Discharge: 2022-09-23 | Disposition: A | Discharge status: Home / Self Care | Payer: Commercial Managed Care - HMO | Attending: Physician Assistant | Admitting: Physician Assistant

## 2022-09-23 ENCOUNTER — Encounter (HOSPITAL_COMMUNITY): Payer: Self-pay | Admitting: Emergency Medicine

## 2022-09-23 DIAGNOSIS — R197 Diarrhea, unspecified: Secondary | ICD-10-CM | POA: Insufficient documentation

## 2022-09-23 DIAGNOSIS — R112 Nausea with vomiting, unspecified: Secondary | ICD-10-CM | POA: Insufficient documentation

## 2022-09-23 DIAGNOSIS — K529 Noninfective gastroenteritis and colitis, unspecified: Secondary | ICD-10-CM | POA: Insufficient documentation

## 2022-09-23 LAB — CBC WITH DIFFERENTIAL/PLATELET
Abs Immature Granulocytes: 0.01 10*3/uL (ref 0.00–0.07)
Basophils Absolute: 0 10*3/uL (ref 0.0–0.1)
Basophils Relative: 1 %
Eosinophils Absolute: 0 10*3/uL (ref 0.0–0.5)
Eosinophils Relative: 0 %
HCT: 44.1 % (ref 36.0–46.0)
Hemoglobin: 14.9 g/dL (ref 12.0–15.0)
Immature Granulocytes: 0 %
Lymphocytes Relative: 32 %
Lymphs Abs: 1.8 10*3/uL (ref 0.7–4.0)
MCH: 32.9 pg (ref 26.0–34.0)
MCHC: 33.8 g/dL (ref 30.0–36.0)
MCV: 97.4 fL (ref 80.0–100.0)
Monocytes Absolute: 0.3 10*3/uL (ref 0.1–1.0)
Monocytes Relative: 6 %
Neutro Abs: 3.6 10*3/uL (ref 1.7–7.7)
Neutrophils Relative %: 61 %
Platelets: 355 10*3/uL (ref 150–400)
RBC: 4.53 MIL/uL (ref 3.87–5.11)
RDW: 14.4 % (ref 11.5–15.5)
WBC: 5.8 10*3/uL (ref 4.0–10.5)
nRBC: 0 % (ref 0.0–0.2)

## 2022-09-23 LAB — POCT URINALYSIS DIP (MANUAL ENTRY)
Bilirubin, UA: NEGATIVE
Glucose, UA: NEGATIVE mg/dL
Leukocytes, UA: NEGATIVE
Nitrite, UA: NEGATIVE
Protein Ur, POC: NEGATIVE mg/dL
Spec Grav, UA: 1.025 (ref 1.010–1.025)
Urobilinogen, UA: 0.2 E.U./dL
pH, UA: 6.5 (ref 5.0–8.0)

## 2022-09-23 LAB — COMPREHENSIVE METABOLIC PANEL
ALT: 18 U/L (ref 0–44)
AST: 23 U/L (ref 15–41)
Albumin: 3.9 g/dL (ref 3.5–5.0)
Alkaline Phosphatase: 61 U/L (ref 38–126)
Anion gap: 14 (ref 5–15)
BUN: 9 mg/dL (ref 8–23)
CO2: 20 mmol/L — ABNORMAL LOW (ref 22–32)
Calcium: 9.7 mg/dL (ref 8.9–10.3)
Chloride: 105 mmol/L (ref 98–111)
Creatinine, Ser: 0.6 mg/dL (ref 0.44–1.00)
GFR, Estimated: >60 mL/min (ref >60)
Glucose, Bld: 63 mg/dL — ABNORMAL LOW (ref 70–99)
Potassium: 4.2 mmol/L (ref 3.5–5.1)
Sodium: 139 mmol/L (ref 135–145)
Total Bilirubin: 0.5 mg/dL (ref 0.3–1.2)
Total Protein: 7.4 g/dL (ref 6.5–8.1)

## 2022-09-23 LAB — LIPASE, BLOOD: Lipase: 34 U/L (ref 11–51)

## 2022-09-23 MED ORDER — ONDANSETRON 4 MG PO TBDP
4.0000 mg | ORAL_TABLET | Freq: Once | ORAL | Status: AC
  Administered 2022-09-23: 4 mg via ORAL

## 2022-09-23 MED ORDER — ONDANSETRON 4 MG PO TBDP: 4.0000 mg | ORAL_TABLET | Freq: Three times a day (TID) | ORAL | 0 refills | Status: DC | PRN

## 2022-09-23 MED ORDER — ONDANSETRON 4 MG PO TBDP
ORAL_TABLET | ORAL | Status: AC
  Filled 2022-09-23: qty 1

## 2022-09-23 NOTE — ED Triage Notes (Signed)
Pt c/o nausea, vomiting and diarrhea started this morning.

## 2022-09-23 NOTE — Discharge Instructions (Signed)
I am glad you are feeling better with the medication.  Please use Zofran every 8 hours as needed.  You can use over-the-counter medication such as Pepto-Bismol if needed.  I will contact you if your lab work is abnormal.  Make sure you rest and drink plenty of fluids.  Avoid spicy/acidic/fatty foods.  I recommend a bland diet such as the brat diet (bananas, rice, applesauce, toast).  If you have any worsening symptoms including abdominal pain, nausea/vomiting even with the medication, blood in your stool, blood in your vomit, weakness you need to go to the emergency room.  If your symptoms or not improving quickly please return here or see your primary care provider.

## 2022-09-23 NOTE — ED Provider Notes (Signed)
[facility]    CSN: [id number] Arrival date & time: 09/23/22  1452      History   Chief Complaint Chief Complaint  Patient presents with   Emesis    Pt c/o nausea, vomiting and diarrhea started this morning.    HPI Tasha Nguyen is a 67 y.o. female.   Patient presents today with a several hour history of GI symptoms.  Reports that she ate a normal breakfast of bacon, eggs and then later developed nausea, vomiting, diarrhea.  She has had 8+ episodes of emesis and watery bowel movements without blood or mucus.  She does have a history of GERD but denies additional gastrointestinal disorder including ulcerative colitis or Crohn's disease.  She does occasionally drink alcohol but denies any increased alcohol consumption.  Denies any recent medication changes or antibiotic use.  Denies any known sick contacts or suspicious food.  She does not take NSAIDs regularly.  Denies previous abdominal surgery and still has gallbladder and appendix.  She has not tried any over-the-counter medication for symptom management.  She denies any associated fever, cough, congestion, chest pain, shortness of breath.    Past Medical History:  Diagnosis Date   Colon polyps     Patient Active Problem List   Diagnosis Date Noted   Gastroesophageal reflux disease without esophagitis 01/14/2022   Prediabetes 06/25/2021   Right shoulder pain 06/25/2021   Vision changes 04/21/2021   Hammer toe 04/21/2021   History of colonic polyps 02/01/2020   PTSD (post-traumatic stress disorder) 11/23/2019   Generalized anxiety disorder 11/23/2019   Psychophysiological insomnia 11/23/2019   Alcohol use 10/23/2019   Callus 10/23/2019   Mixed hyperlipidemia 08/16/2008   Essential hypertension 08/16/2008    Past Surgical History:  Procedure Laterality Date   none      OB History   No obstetric history on file.      Home Medications    Prior to Admission medications   Medication Sig Start Date  End Date Taking? Authorizing Provider  ondansetron (ZOFRAN-ODT) 4 MG disintegrating tablet Take 1 tablet (4 mg total) by mouth every 8 (eight) hours as needed for nausea or vomiting. 09/23/22  Yes Yared Barefoot K, PA-C  amLODipine (NORVASC) 10 MG tablet Take 1 tablet (10 mg total) by mouth daily. 01/14/22   Storm Frisk, MD  atorvastatin (LIPITOR) 20 MG tablet Take 1 tablet (20 mg total) by mouth daily. 01/14/22   Storm Frisk, MD  dextromethorphan (DELSYM) 30 MG/5ML liquid Take 5 mLs (30 mg total) by mouth as needed for cough. 01/14/22   Storm Frisk, MD  diclofenac Sodium (VOLTAREN) 1 % GEL Apply 2 g topically 4 (four) times daily. To right shoulder 01/14/22   Storm Frisk, MD  pantoprazole (PROTONIX) 40 MG tablet Take 1 tablet (40 mg total) by mouth daily. 01/14/22   Storm Frisk, MD    Family History Family History  Problem Relation Age of Onset   Diabetes Mother    CAD Father    CAD Sister    Diabetes Sister    Diabetes Sister    Colon cancer Neg Hx    Esophageal cancer Neg Hx    Stomach cancer Neg Hx    Pancreatic cancer Neg Hx    Liver disease Neg Hx    Rectal cancer Neg Hx    Breast cancer Neg Hx     Social History Social History   Tobacco Use   Smoking status: Never  Smokeless tobacco: Never  Vaping Use   Vaping Use: Never used  Substance Use Topics   Alcohol use: Yes    Comment: weekends-4-5 beers   Drug use: Yes    Types: Marijuana    Comment: occ     Allergies   Hydrocodone-acetaminophen, Oxycodone, and Oxycodone-acetaminophen   Review of Systems Review of Systems  Constitutional:  Positive for activity change. Negative for appetite change, fatigue and fever.  Respiratory:  Negative for cough and shortness of breath.   Cardiovascular:  Negative for chest pain.  Gastrointestinal:  Positive for diarrhea, nausea and vomiting. Negative for abdominal pain, blood in stool and constipation.  Genitourinary:  Negative for dysuria,  frequency and urgency.  Neurological:  Negative for dizziness, light-headedness and headaches.     Physical Exam Triage Vital Signs ED Triage Vitals  Enc Vitals Group     BP 09/23/22 1512 139/86     Pulse Rate 09/23/22 1512 75     Resp 09/23/22 1512 18     Temp 09/23/22 1512 98.5 F (36.9 C)     Temp Source 09/23/22 1512 Oral     SpO2 09/23/22 1512 99 %     Weight 09/23/22 1513 138 lb 14.2 oz (63 kg)     Height 09/23/22 1513 5\' 9"  (1.753 m)     Head Circumference --      Peak Flow --      Pain Score 09/23/22 1513 4     Pain Loc --      Pain Edu? --      Excl. in GC? --    No data found.  Updated Vital Signs BP 139/86 (BP Location: Right Arm)   Pulse 75   Temp 98.5 F (36.9 C) (Oral)   Resp 18   Ht 5\' 9"  (1.753 m)   Wt 138 lb 14.2 oz (63 kg)   SpO2 99%   BMI 20.51 kg/m   Visual Acuity Right Eye Distance:   Left Eye Distance:   Bilateral Distance:    Right Eye Near:   Left Eye Near:    Bilateral Near:     Physical Exam Vitals reviewed.  Constitutional:      General: She is awake. She is not in acute distress.    Appearance: Normal appearance. She is well-developed. She is not ill-appearing.     Comments: Very pleasant female appears stated age no acute distress sitting comfortably in exam room  HENT:     Head: Normocephalic and atraumatic.  Cardiovascular:     Rate and Rhythm: Normal rate and regular rhythm.     Heart sounds: Normal heart sounds, S1 normal and S2 normal. No murmur heard. Pulmonary:     Effort: Pulmonary effort is normal.     Breath sounds: Normal breath sounds. No wheezing, rhonchi or rales.     Comments: Clear to auscultation bilaterally Abdominal:     General: Bowel sounds are normal.     Palpations: Abdomen is soft.     Tenderness: There is generalized abdominal tenderness. There is no right CVA tenderness, left CVA tenderness, guarding or rebound. Negative signs include Murphy's sign, Rovsing's sign, McBurney's sign and psoas sign.      Comments: Mild tenderness palpation throughout abdomen.  No focal tenderness on exam.  Psychiatric:        Behavior: Behavior is cooperative.      UC Treatments / Results  Labs (all labs ordered are listed, but only abnormal results are displayed) Labs Reviewed  POCT URINALYSIS DIP (MANUAL ENTRY) - Abnormal; Notable for the following components:      Result Value   Ketones, POC UA trace (5) (*)    Blood, UA trace-intact (*)    All other components within normal limits  CBC WITH DIFFERENTIAL/PLATELET  COMPREHENSIVE METABOLIC PANEL  LIPASE, BLOOD    EKG   Radiology No results found.  Procedures Procedures (including critical care time)  Medications Ordered in UC Medications  ondansetron (ZOFRAN-ODT) disintegrating tablet 4 mg (4 mg Oral Given 09/23/22 1533)    Initial Impression / Assessment and Plan / UC Course  I have reviewed the triage vital signs and the nursing notes.  Pertinent labs & imaging results that were available during my care of the patient were reviewed by me and considered in my medical decision making (see chart for details).     Patient is well-appearing, afebrile, nontoxic, nontachycardic.  Vital signs and physical exam are reassuring with no indication for emergent evaluation or imaging.  Patient was given Zofran in clinic with improvement of symptoms.  She was able to pass oral challenge.  Urine had trace blood but no evidence of infection.  CBC, CMP, lipase were obtained given severity of vomiting and diarrhea symptoms and are pending.  We will contact her if this is abnormal.  She was sent home with a prescription for Zofran and encouraged to take this on a scheduled basis for the next several days.  She is can then decrease use to every 8 hours as needed.  Recommended a bland diet and drinking plenty of fluid.  Discussed that if she has any worsening or changing symptoms she should be reevaluated immediately.  Strict return precautions given.   Work excuse note provided.  Final Clinical Impressions(s) / UC Diagnoses   Final diagnoses:  Gastroenteritis  Nausea vomiting and diarrhea     Discharge Instructions      I am glad you are feeling better with the medication.  Please use Zofran every 8 hours as needed.  You can use over-the-counter medication such as Pepto-Bismol if needed.  I will contact you if your lab work is abnormal.  Make sure you rest and drink plenty of fluids.  Avoid spicy/acidic/fatty foods.  I recommend a bland diet such as the brat diet (bananas, rice, applesauce, toast).  If you have any worsening symptoms including abdominal pain, nausea/vomiting even with the medication, blood in your stool, blood in your vomit, weakness you need to go to the emergency room.  If your symptoms or not improving quickly please return here or see your primary care provider.      ED Prescriptions     Medication Sig Dispense Auth. Provider   ondansetron (ZOFRAN-ODT) 4 MG disintegrating tablet Take 1 tablet (4 mg total) by mouth every 8 (eight) hours as needed for nausea or vomiting. 20 tablet Emmanuell Kantz, Noberto Retort, PA-C      PDMP not reviewed this encounter.   Jeani Hawking, PA-C 09/23/22 1608

## 2022-11-11 ENCOUNTER — Ambulatory Visit (INDEPENDENT_AMBULATORY_CARE_PROVIDER_SITE_OTHER): Payer: BLUE CROSS/BLUE SHIELD | Admitting: Podiatry

## 2022-11-11 DIAGNOSIS — Q828 Other specified congenital malformations of skin: Secondary | ICD-10-CM | POA: Diagnosis not present

## 2022-11-11 NOTE — Progress Notes (Signed)
  Subjective:  Patient ID: Tasha Nguyen, female    DOB: April 20, 1955,  MRN: [id number]  Chief Complaint  Patient presents with   Callouses    67 y.o. female presents with the above complaint.  Patient presents with left submetatarsal 2/5/1 hyperkeratotic lesion/porokeratotic lesion.  Painful to touch.  Has progressive gotten worse debrided to help.  She said the orthotics helps occasionally but overall she tries to wear them as much as possible   Review of Systems: Negative except as noted in the HPI. Denies N/V/F/Ch.  Past Medical History:  Diagnosis Date   Colon polyps     Current Outpatient Medications:    amLODipine (NORVASC) 10 MG tablet, Take 1 tablet (10 mg total) by mouth daily., Disp: 90 tablet, Rfl: 1   atorvastatin (LIPITOR) 20 MG tablet, Take 1 tablet (20 mg total) by mouth daily., Disp: 90 tablet, Rfl: 1   dextromethorphan (DELSYM) 30 MG/5ML liquid, Take 5 mLs (30 mg total) by mouth as needed for cough., Disp: 89 mL, Rfl: 0   diclofenac Sodium (VOLTAREN) 1 % GEL, Apply 2 g topically 4 (four) times daily. To right shoulder, Disp: 100 g, Rfl: 2   ondansetron (ZOFRAN-ODT) 4 MG disintegrating tablet, Take 1 tablet (4 mg total) by mouth every 8 (eight) hours as needed for nausea or vomiting., Disp: 20 tablet, Rfl: 0   pantoprazole (PROTONIX) 40 MG tablet, Take 1 tablet (40 mg total) by mouth daily., Disp: 60 tablet, Rfl: 3  Social History   Tobacco Use  Smoking Status Never  Smokeless Tobacco Never    Allergies  Allergen Reactions   Hydrocodone-Acetaminophen     Other reaction(s): Other (See Comments) unknown   Oxycodone Other (See Comments)    Hallucinations Other reaction(s): Other (See Comments) Hallucinations   Oxycodone-Acetaminophen     Other reaction(s): Other (See Comments) unknown   Objective:  There were no vitals filed for this visit. There is no height or weight on file to calculate BMI. Constitutional Well developed. Well nourished.  Vascular  Dorsalis pedis pulses palpable bilaterally. Posterior tibial pulses palpable bilaterally. Capillary refill normal to all digits.  No cyanosis or clubbing noted. Pedal hair growth normal.  Neurologic Normal speech. Oriented to person, place, and time. Epicritic sensation to light touch grossly present bilaterally.  Dermatologic  hyperkeratotic lesion noted to left submetatarsal two as well as left lateral fifth and hallux digit.  No pinpoint bleeding noted.  No central nucleated core noted.  Orthopedic: Normal joint ROM without pain or crepitus bilaterally. No visible deformities. No bony tenderness.   Radiographs: None Assessment:   1. Porokeratosis        Plan:  Patient was evaluated and treated and all questions answered.  Left fifth digit and distal left submetatarsal t porokeratosis/benign skin lesion with underlying pes planovalgus foot structure -I explained patient the etiology of hyperkeratotic lesion/benign skin lesion various treatment options were extensively discussed.  This likely has to do the weightbearing distribution and tight shoes.  I discussed with her shoe gear modification and provided her with debridement of the hyperkeratotic lesion.  Patient states that she will make the shoe gear modification as we discussed as well as utilize toe protector. -Using chisel blade and a handle, the hyperkeratotic lesion was debrided down to healthy striated tissue.  No pinpoint bleeding noted.  No central nucleated core noted. -She states the orthotics helped a little bit continue wearing orthotics.  No follow-ups on file.

## 2022-11-22 NOTE — Progress Notes (Unsigned)
Complete physical exam  Patient: Tasha Nguyen   DOB: 1955/05/24   67 y.o. Female  MRN: [id number]  Subjective:    No chief complaint on file.  01/2022 Tasha Nguyen is a 67 y.o. female who presents today for a complete physical exam. She reports consuming a  salads fruit  diet.  Not much exercise  She generally feels well. She reports sleeping well. She does have additional problems to discuss today.  Patient is still drinking about 4 beers a day she has also a chronic cough this been going on for about 2 weeks.  She denies any other respiratory complaints.  She saw podiatrist 4 weeks ago but is still having foot pain.  She had a callus removed.  She is no longer smoking.  On arrival blood pressure is good 127/84.  She agrees to and did receive the flu vaccine.  Patient cannot afford chlorthalidone she is on amlodipine alone doing well Patient has left shoulder pain as well and needs a refill on her Voltaren gel   11/23/22   Patient Active Problem List   Diagnosis Date Noted   Gastroesophageal reflux disease without esophagitis 01/14/2022   Prediabetes 06/25/2021   Right shoulder pain 06/25/2021   Vision changes 04/21/2021   Hammer toe 04/21/2021   History of colonic polyps 02/01/2020   PTSD (post-traumatic stress disorder) 11/23/2019   Generalized anxiety disorder 11/23/2019   Psychophysiological insomnia 11/23/2019   Alcohol use 10/23/2019   Callus 10/23/2019   Mixed hyperlipidemia 08/16/2008   Essential hypertension 08/16/2008   Past Medical History:  Diagnosis Date   Colon polyps    Past Surgical History:  Procedure Laterality Date   none     Social History   Tobacco Use   Smoking status: Never   Smokeless tobacco: Never  Vaping Use   Vaping status: Never Used  Substance Use Topics   Alcohol use: Yes    Comment: weekends-4-5 beers   Drug use: Yes    Types: Marijuana    Comment: occ   Social History   Socioeconomic History   Marital status: Single     Spouse name: Not on file   Number of children: Not on file   Years of education: Not on file   Highest education level: Not on file  Occupational History   Not on file  Tobacco Use   Smoking status: Never   Smokeless tobacco: Never  Vaping Use   Vaping status: Never Used  Substance and Sexual Activity   Alcohol use: Yes    Comment: weekends-4-5 beers   Drug use: Yes    Types: Marijuana    Comment: occ   Sexual activity: Yes    Birth control/protection: None  Other Topics Concern   Not on file  Social History Narrative   Not on file   Social Determinants of Health   Financial Resource Strain: Not on file  Food Insecurity: Not on file  Transportation Needs: Not on file  Physical Activity: Not on file  Stress: Not on file  Social Connections: Not on file  Intimate Partner Violence: Not on file   Family Status  Relation Name Status   Mother  (Not Specified)   Father  (Not Specified)   Sister  (Not Specified)   Sister  (Not Specified)   Neg Hx  (Not Specified)  No partnership data on file   Family History  Problem Relation Age of Onset   Diabetes Mother    CAD Father  CAD Sister    Diabetes Sister    Diabetes Sister    Colon cancer Neg Hx    Esophageal cancer Neg Hx    Stomach cancer Neg Hx    Pancreatic cancer Neg Hx    Liver disease Neg Hx    Rectal cancer Neg Hx    Breast cancer Neg Hx    Allergies  Allergen Reactions   Hydrocodone-Acetaminophen     Other reaction(s): Other (See Comments) unknown   Oxycodone Other (See Comments)    Hallucinations Other reaction(s): Other (See Comments) Hallucinations   Oxycodone-Acetaminophen     Other reaction(s): Other (See Comments) unknown      Patient Care Team: Storm Frisk, MD as PCP - General (Pulmonary Disease)   Outpatient Medications Prior to Visit  Medication Sig   amLODipine (NORVASC) 10 MG tablet Take 1 tablet (10 mg total) by mouth daily.   atorvastatin (LIPITOR) 20 MG tablet Take 1  tablet (20 mg total) by mouth daily.   dextromethorphan (DELSYM) 30 MG/5ML liquid Take 5 mLs (30 mg total) by mouth as needed for cough.   diclofenac Sodium (VOLTAREN) 1 % GEL Apply 2 g topically 4 (four) times daily. To right shoulder   ondansetron (ZOFRAN-ODT) 4 MG disintegrating tablet Take 1 tablet (4 mg total) by mouth every 8 (eight) hours as needed for nausea or vomiting.   pantoprazole (PROTONIX) 40 MG tablet Take 1 tablet (40 mg total) by mouth daily.   No facility-administered medications prior to visit.    Review of Systems  Constitutional:  Negative for chills, diaphoresis, fever, malaise/fatigue and weight loss.  HENT:  Negative for congestion, ear discharge, ear pain, hearing loss, nosebleeds, sore throat and tinnitus.   Eyes:  Negative for blurred vision, photophobia and redness.  Respiratory:  Positive for cough and sputum production. Negative for hemoptysis, shortness of breath, wheezing and stridor.        Sputum white  Cardiovascular:  Negative for chest pain, palpitations, orthopnea, claudication, leg swelling and PND.  Gastrointestinal:  Negative for abdominal pain, blood in stool, constipation, diarrhea, heartburn, nausea and vomiting.  Genitourinary:  Negative for dysuria, flank pain, frequency, hematuria and urgency.  Musculoskeletal:  Positive for joint pain. Negative for back pain, falls, myalgias and neck pain.  Skin:  Negative for itching and rash.  Neurological:  Negative for dizziness, tingling, tremors, sensory change, speech change, focal weakness, seizures, loss of consciousness, weakness and headaches.  Endo/Heme/Allergies:  Negative for environmental allergies and polydipsia. Does not bruise/bleed easily.  Psychiatric/Behavioral:  Negative for depression, memory loss, substance abuse and suicidal ideas. The patient is not nervous/anxious and does not have insomnia.           Objective:     There were no vitals taken for this visit. BP Readings from  Last 3 Encounters:  09/23/22 139/86  01/14/22 127/84  12/16/21 (!) 146/85   Wt Readings from Last 3 Encounters:  09/23/22 138 lb 14.2 oz (63 kg)  01/14/22 136 lb 12.8 oz (62.1 kg)  08/28/21 135 lb 12.8 oz (61.6 kg)      Physical Exam Vitals reviewed.  Constitutional:      Appearance: Normal appearance. She is well-developed. She is not diaphoretic.  HENT:     Head: Normocephalic and atraumatic.     Nose: No nasal deformity, septal deviation, mucosal edema or rhinorrhea.     Right Sinus: No maxillary sinus tenderness or frontal sinus tenderness.     Left Sinus: No maxillary sinus  tenderness or frontal sinus tenderness.     Mouth/Throat:     Pharynx: No oropharyngeal exudate.  Eyes:     General: No scleral icterus.    Conjunctiva/sclera: Conjunctivae normal.     Pupils: Pupils are equal, round, and reactive to light.  Neck:     Thyroid: No thyromegaly.     Vascular: No carotid bruit or JVD.     Trachea: Trachea normal. No tracheal tenderness or tracheal deviation.  Cardiovascular:     Rate and Rhythm: Normal rate and regular rhythm.     Chest Wall: PMI is not displaced.     Pulses: Normal pulses. No decreased pulses.     Heart sounds: Normal heart sounds, S1 normal and S2 normal. Heart sounds not distant. No murmur heard.    No systolic murmur is present.     No diastolic murmur is present.     No friction rub. No gallop. No S3 or S4 sounds.  Pulmonary:     Effort: No tachypnea, accessory muscle usage or respiratory distress.     Breath sounds: No stridor. No decreased breath sounds, wheezing, rhonchi or rales.  Chest:     Chest wall: No tenderness.  Abdominal:     General: Bowel sounds are normal. There is no distension.     Palpations: Abdomen is soft. Abdomen is not rigid.     Tenderness: There is no abdominal tenderness. There is no guarding or rebound.  Musculoskeletal:        General: Normal range of motion.     Cervical back: Normal range of motion and neck  supple. No edema, erythema or rigidity. No muscular tenderness. Normal range of motion.  Lymphadenopathy:     Head:     Right side of head: No submental or submandibular adenopathy.     Left side of head: No submental or submandibular adenopathy.     Cervical: No cervical adenopathy.  Skin:    General: Skin is warm and dry.     Coloration: Skin is not pale.     Findings: No rash.     Nails: There is no clubbing.  Neurological:     Mental Status: She is alert and oriented to person, place, and time.     Sensory: No sensory deficit.  Psychiatric:        Speech: Speech normal.        Behavior: Behavior normal.      No results found for any visits on 11/24/22. Last CBC Lab Results  Component Value Date   WBC 5.8 09/23/2022   HGB 14.9 09/23/2022   HCT 44.1 09/23/2022   MCV 97.4 09/23/2022   MCH 32.9 09/23/2022   RDW 14.4 09/23/2022   PLT 355 09/23/2022   Last metabolic panel Lab Results  Component Value Date   GLUCOSE 63 (L) 09/23/2022   NA 139 09/23/2022   K 4.2 09/23/2022   CL 105 09/23/2022   CO2 20 (L) 09/23/2022   BUN 9 09/23/2022   CREATININE 0.60 09/23/2022   EGFR 100 08/28/2021   CALCIUM 9.7 09/23/2022   PROT 7.4 09/23/2022   ALBUMIN 3.9 09/23/2022   LABGLOB 2.5 08/28/2021   AGRATIO 1.8 08/28/2021   BILITOT 0.5 09/23/2022   ALKPHOS 61 09/23/2022   AST 23 09/23/2022   ALT 18 09/23/2022   ANIONGAP 14 09/23/2022   Last lipids Lab Results  Component Value Date   CHOL 244 (H) 06/25/2021   HDL 56 06/25/2021   LDLCALC 172 (H) 06/25/2021  TRIG 94 06/25/2021   CHOLHDL 4.4 06/25/2021        Assessment & Plan:    Routine Health Maintenance and Physical Exam  Immunization History  Administered Date(s) Administered   Influenza,inj,Quad PF,6+ Mos 12/17/2020, 01/14/2022   PFIZER(Purple Top)SARS-COV-2 Vaccination 07/06/2019, 09/07/2019   PNEUMOCOCCAL CONJUGATE-20 04/24/2021   Pneumococcal Polysaccharide-23 08/16/2008   Td 04/07/2007   Tdap 10/23/2019     Health Maintenance  Topic Date Due   Lung Cancer Screening  Never done   Zoster Vaccines- Shingrix (1 of 2) Never done   COVID-19 Vaccine (3 - 2023-24 season) 12/05/2021   INFLUENZA VACCINE  11/05/2022   MAMMOGRAM  02/27/2023   Colonoscopy  03/08/2025   PAP SMEAR-Modifier  05/15/2026   DTaP/Tdap/Td (3 - Td or Tdap) 10/22/2029   Hepatitis C Screening  Completed   HIV Screening  Completed   HPV VACCINES  Aged Out    Discussed health benefits of physical activity, and encouraged her to engage in regular exercise appropriate for her age and condition.  Problem List Items Addressed This Visit   None   No follow-ups on file.     Shan Levans, MD

## 2022-11-24 ENCOUNTER — Ambulatory Visit: Payer: BC Managed Care – PPO | Admitting: Critical Care Medicine

## 2022-11-24 ENCOUNTER — Encounter: Payer: Self-pay | Admitting: Critical Care Medicine

## 2022-11-24 ENCOUNTER — Ambulatory Visit: Payer: Commercial Managed Care - HMO | Attending: Critical Care Medicine | Admitting: Critical Care Medicine

## 2022-11-24 VITALS — BP 136/99 | HR 73 | Wt 136.4 lb

## 2022-11-24 DIAGNOSIS — I10 Essential (primary) hypertension: Secondary | ICD-10-CM

## 2022-11-24 DIAGNOSIS — R7303 Prediabetes: Secondary | ICD-10-CM

## 2022-11-24 DIAGNOSIS — E782 Mixed hyperlipidemia: Secondary | ICD-10-CM

## 2022-11-24 MED ORDER — AMLODIPINE BESYLATE 10 MG PO TABS: 10.0000 mg | ORAL_TABLET | Freq: Every day | ORAL | 1 refills | Status: DC

## 2022-11-24 MED ORDER — ATORVASTATIN CALCIUM 20 MG PO TABS: 20.0000 mg | ORAL_TABLET | Freq: Every day | ORAL | 1 refills | Status: DC

## 2022-11-24 MED ORDER — PANTOPRAZOLE SODIUM 40 MG PO TBEC: 40.0000 mg | DELAYED_RELEASE_TABLET | Freq: Every day | ORAL | 3 refills | Status: DC

## 2022-11-24 NOTE — Patient Instructions (Addendum)
Resume amlodipine, atorvastatin and pantoprazole  sent to your [facility]abs today  Referral to new primary care provider made  Return dr Delford Field 3 weeks for blood pressure recheck

## 2022-11-25 ENCOUNTER — Other Ambulatory Visit: Payer: Self-pay | Admitting: Pharmacist

## 2022-11-25 ENCOUNTER — Telehealth: Payer: Self-pay

## 2022-11-25 ENCOUNTER — Other Ambulatory Visit: Payer: Self-pay | Admitting: *Deleted

## 2022-11-25 LAB — LIPID PANEL
Chol/HDL Ratio: 4.4 ratio (ref 0.0–4.4)
Cholesterol, Total: 203 mg/dL — ABNORMAL HIGH (ref 100–199)
HDL: 46 mg/dL (ref >39)
LDL Chol Calc (NIH): 127 mg/dL — ABNORMAL HIGH (ref 0–99)
Triglycerides: 170 mg/dL — ABNORMAL HIGH (ref 0–149)
VLDL Cholesterol Cal: 30 mg/dL (ref 5–40)

## 2022-11-25 MED ORDER — PANTOPRAZOLE SODIUM 40 MG PO TBEC: 40.0000 mg | DELAYED_RELEASE_TABLET | Freq: Every day | ORAL | 3 refills | Status: DC

## 2022-11-25 MED ORDER — ATORVASTATIN CALCIUM 20 MG PO TABS: 20.0000 mg | ORAL_TABLET | Freq: Every day | ORAL | 1 refills | Status: DC

## 2022-11-25 MED ORDER — AMLODIPINE BESYLATE 10 MG PO TABS: 10.0000 mg | ORAL_TABLET | Freq: Every day | ORAL | 1 refills | Status: DC

## 2022-11-25 NOTE — Assessment & Plan Note (Signed)
Hypertension uncontrolled due to the lack of taking current medications Will renew amlodipine 10 mg daily have the patient come back for blood pressure recheck

## 2022-11-25 NOTE — Assessment & Plan Note (Signed)
Will assess lipids and renew atorvastatin

## 2022-11-25 NOTE — Telephone Encounter (Signed)
Pt was called and vm was left, Information has been sent to nurse pool.   

## 2022-11-25 NOTE — Telephone Encounter (Signed)
-----   Message from Shan Levans sent at 11/25/2022  5:52 AM EDT ----- Let pt know to get back on cholesterol pill as levels are very high off medicaition

## 2022-11-25 NOTE — Progress Notes (Signed)
Let pt know to get back on cholesterol pill as levels are very high off medicaition

## 2022-12-16 ENCOUNTER — Ambulatory Visit: Payer: Commercial Managed Care - HMO | Attending: Critical Care Medicine | Admitting: Critical Care Medicine

## 2022-12-16 ENCOUNTER — Encounter: Payer: Self-pay | Admitting: Critical Care Medicine

## 2022-12-16 VITALS — BP 119/77 | HR 68 | Wt 138.0 lb

## 2022-12-16 DIAGNOSIS — Z23 Encounter for immunization: Secondary | ICD-10-CM

## 2022-12-16 DIAGNOSIS — I10 Essential (primary) hypertension: Secondary | ICD-10-CM | POA: Diagnosis not present

## 2022-12-16 DIAGNOSIS — Z122 Encounter for screening for malignant neoplasm of respiratory organs: Secondary | ICD-10-CM

## 2022-12-16 DIAGNOSIS — M79641 Pain in right hand: Secondary | ICD-10-CM | POA: Diagnosis not present

## 2022-12-16 DIAGNOSIS — L84 Corns and callosities: Secondary | ICD-10-CM

## 2022-12-16 DIAGNOSIS — K219 Gastro-esophageal reflux disease without esophagitis: Secondary | ICD-10-CM

## 2022-12-16 DIAGNOSIS — E782 Mixed hyperlipidemia: Secondary | ICD-10-CM

## 2022-12-16 DIAGNOSIS — M79642 Pain in left hand: Secondary | ICD-10-CM

## 2022-12-16 MED ORDER — DICLOFENAC SODIUM 1 % EX GEL: 2.0000 g | Freq: Four times a day (QID) | CUTANEOUS | 0 refills | Status: DC

## 2022-12-16 MED ORDER — AMLODIPINE BESYLATE 10 MG PO TABS: 5.0000 mg | ORAL_TABLET | Freq: Every day | ORAL | Status: DC

## 2022-12-16 NOTE — Assessment & Plan Note (Signed)
Hypertension at goal plan is to reduce amlodipine to 5 mg daily and observe

## 2022-12-16 NOTE — Assessment & Plan Note (Signed)
Patient with intermittent use of proton pump inhibitor nocturnal cough due to reflux patient encouraged to take pantoprazole daily

## 2022-12-16 NOTE — Assessment & Plan Note (Signed)
Shared decision making used we will get CT of chest low-dose for lung cancer screening

## 2022-12-16 NOTE — Progress Notes (Signed)
Est [facility]  Patient: Tasha Nguyen   DOB: [birth year]   67 y.o. Female  MRN: [id number]  Subjective:    Chief Complaint  Patient presents with   Hypertension   01/2022 VIRDELL BROCKSMITH is a 67 y.o. female who presents today for a complete physical exam. She reports consuming a  salads fruit  diet.  Not much exercise  She generally feels well. She reports sleeping well. She does have additional problems to discuss today.  Patient is still drinking about 4 beers a day she has also a chronic cough this been going on for about 2 weeks.  She denies any other respiratory complaints.  She saw podiatrist 4 weeks ago but is still having foot pain.  She had a callus removed.  She is no longer smoking.  On arrival blood pressure is good 127/84.  She agrees to and did receive the flu vaccine.  Patient cannot afford chlorthalidone she is on amlodipine alone doing well Patient has left shoulder pain as well and needs a refill on her Voltaren gel   11/24/22 Patient returns in follow-up and on arrival blood pressure is elevated 136/99. The patient has been not taking any medication including her hypertensive meds for several months.  She was last seen in October of last year.  There are no specific symptoms at this time.  12/16/22 Patient presents for  3 week BP f/u. She  complains of bilateral wrist/hand pain, weakness, and stiffness that started 2 days ago. Patient says at times she drops things.  Patient Active Problem List   Diagnosis Date Noted   Pain in both hands 12/16/2022   Encounter for screening for lung cancer 12/16/2022   Gastroesophageal reflux disease without esophagitis 01/14/2022   Prediabetes 06/25/2021   Right shoulder pain 06/25/2021   Vision changes 04/21/2021   Hammer toe 04/21/2021   History of colonic polyps 02/01/2020   PTSD (post-traumatic stress disorder) 11/23/2019   Generalized anxiety disorder 11/23/2019   Psychophysiological insomnia 11/23/2019   Callus 10/23/2019    Mixed hyperlipidemia 08/16/2008   Essential hypertension 08/16/2008   Past Medical History:  Diagnosis Date   Colon polyps    Past Surgical History:  Procedure Laterality Date   none     Social History   Tobacco Use   Smoking status: Never   Smokeless tobacco: Never  Vaping Use   Vaping status: Never Used  Substance Use Topics   Alcohol use: Yes    Comment: weekends-4-5 beers   Drug use: Yes    Types: Marijuana    Comment: occ   Social History   Socioeconomic History   Marital status: Single    Spouse name: Not on file   Number of children: Not on file   Years of education: Not on file   Highest education level: Not on file  Occupational History   Not on file  Tobacco Use   Smoking status: Never   Smokeless tobacco: Never  Vaping Use   Vaping status: Never Used  Substance and Sexual Activity   Alcohol use: Yes    Comment: weekends-4-5 beers   Drug use: Yes    Types: Marijuana    Comment: occ   Sexual activity: Yes    Birth control/protection: None  Other Topics Concern   Not on file  Social History Narrative   Not on file   Social Determinants of Health   Financial Resource Strain: Not on file  Food Insecurity: Not on file  Transportation  Needs: Not on file  Physical Activity: Not on file  Stress: Not on file  Social Connections: Not on file  Intimate Partner Violence: Not on file   Family Status  Relation Name Status   Mother  (Not Specified)   Father  (Not Specified)   Sister  (Not Specified)   Sister  (Not Specified)   Neg Hx  (Not Specified)  No partnership data on file   Family History  Problem Relation Age of Onset   Diabetes Mother    CAD Father    CAD Sister    Diabetes Sister    Diabetes Sister    Colon cancer Neg Hx    Esophageal cancer Neg Hx    Stomach cancer Neg Hx    Pancreatic cancer Neg Hx    Liver disease Neg Hx    Rectal cancer Neg Hx    Breast cancer Neg Hx    Allergies  Allergen Reactions    Hydrocodone-Acetaminophen     Other reaction(s): Other (See Comments) unknown   Oxycodone Other (See Comments)    Hallucinations Other reaction(s): Other (See Comments) Hallucinations   Oxycodone-Acetaminophen     Other reaction(s): Other (See Comments) unknown      Patient Care Team: Storm Frisk, MD as PCP - General (Pulmonary Disease)   Outpatient Medications Prior to Visit  Medication Sig   atorvastatin (LIPITOR) 20 MG tablet Take 1 tablet (20 mg total) by mouth daily.   pantoprazole (PROTONIX) 40 MG tablet Take 1 tablet (40 mg total) by mouth daily.   [DISCONTINUED] amLODipine (NORVASC) 10 MG tablet Take 1 tablet (10 mg total) by mouth daily.   No facility-administered medications prior to visit.    Review of Systems  Constitutional: Negative.  Negative for chills, diaphoresis, fever, malaise/fatigue and weight loss.  HENT: Negative.  Negative for congestion, ear discharge, ear pain, hearing loss, nosebleeds, sore throat and tinnitus.   Eyes: Negative.  Negative for blurred vision, photophobia and redness.  Respiratory:  Positive for cough and shortness of breath. Negative for hemoptysis, wheezing and stridor.        Shortness of breath when talking; cough at night w/ clear secretions  Cardiovascular: Negative.  Negative for chest pain, palpitations, orthopnea, claudication, leg swelling and PND.  Gastrointestinal:  Positive for heartburn. Negative for abdominal pain, blood in stool, constipation, diarrhea, nausea and vomiting.  Genitourinary:  Negative for dysuria, flank pain, frequency, hematuria and urgency.  Musculoskeletal:  Positive for joint pain. Negative for back pain, falls, myalgias and neck pain.       Pain in bilateral wrists/hands  Skin:  Negative for itching and rash.  Neurological:  Negative for dizziness, tingling, tremors, sensory change, speech change, focal weakness, seizures, loss of consciousness, weakness and headaches.  Endo/Heme/Allergies:   Negative for environmental allergies and polydipsia. Does not bruise/bleed easily.  Psychiatric/Behavioral:  Negative for depression, memory loss, substance abuse and suicidal ideas. The patient is not nervous/anxious and does not have insomnia.           Objective:     BP 119/77 (BP Location: Left Arm, Patient Position: Sitting, Cuff Size: Normal)   Pulse 68   Wt 138 lb (62.6 kg)   SpO2 99%   BMI 20.38 kg/m  BP Readings from Last 3 Encounters:  12/16/22 119/77  11/24/22 (!) 136/99  09/23/22 139/86   Wt Readings from Last 3 Encounters:  12/16/22 138 lb (62.6 kg)  11/24/22 136 lb 6.4 oz (61.9 kg)  09/23/22 138  lb 14.2 oz (63 kg)      Physical Exam Vitals reviewed.  Constitutional:      Appearance: Normal appearance. She is well-developed. She is not diaphoretic.  HENT:     Head: Normocephalic and atraumatic.     Nose: No nasal deformity, septal deviation, mucosal edema or rhinorrhea.     Right Sinus: No maxillary sinus tenderness or frontal sinus tenderness.     Left Sinus: No maxillary sinus tenderness or frontal sinus tenderness.     Mouth/Throat:     Pharynx: No oropharyngeal exudate.  Eyes:     General: No scleral icterus. Neck:     Thyroid: No thyromegaly.     Vascular: No carotid bruit or JVD.     Trachea: No tracheal tenderness or tracheal deviation.  Cardiovascular:     Rate and Rhythm: Normal rate and regular rhythm.     Chest Wall: PMI is not displaced.     Pulses: Normal pulses. No decreased pulses.     Heart sounds: Normal heart sounds, S1 normal and S2 normal. Heart sounds not distant. No murmur heard.    No systolic murmur is present.     No diastolic murmur is present.     No friction rub. No gallop. No S3 or S4 sounds.  Pulmonary:     Effort: Pulmonary effort is normal. No tachypnea, accessory muscle usage or respiratory distress.     Breath sounds: No stridor. Examination of the right-middle field reveals decreased breath sounds. Examination of  the left-middle field reveals decreased breath sounds. Examination of the right-lower field reveals decreased breath sounds. Examination of the left-lower field reveals decreased breath sounds. Decreased breath sounds present. No wheezing, rhonchi or rales.  Chest:     Chest wall: No tenderness.  Abdominal:     General: There is no distension.     Palpations: Abdomen is not rigid.     Tenderness: There is no abdominal tenderness. There is no guarding or rebound.  Musculoskeletal:        General: Normal range of motion.     Right hand: Bony tenderness present.     Left hand: Bony tenderness present.     Cervical back: No edema or erythema. No muscular tenderness. Normal range of motion.     Comments: Tenderness L>R thumb joints  Lymphadenopathy:     Head:     Right side of head: No submental or submandibular adenopathy.     Left side of head: No submental or submandibular adenopathy.     Cervical: No cervical adenopathy.  Skin:    General: Skin is warm and dry.     Coloration: Skin is not pale.     Findings: No rash.     Nails: There is no clubbing.  Neurological:     Mental Status: She is alert and oriented to person, place, and time.     Sensory: No sensory deficit.  Psychiatric:        Speech: Speech normal.        Behavior: Behavior normal.      No results found for any visits on 12/16/22. Last CBC Lab Results  Component Value Date   WBC 5.8 09/23/2022   HGB 14.9 09/23/2022   HCT 44.1 09/23/2022   MCV 97.4 09/23/2022   MCH 32.9 09/23/2022   RDW 14.4 09/23/2022   PLT 355 09/23/2022   Last metabolic panel Lab Results  Component Value Date   GLUCOSE 63 (L) 09/23/2022   NA 139  09/23/2022   K 4.2 09/23/2022   CL 105 09/23/2022   CO2 20 (L) 09/23/2022   BUN 9 09/23/2022   CREATININE 0.60 09/23/2022   EGFR 100 08/28/2021   CALCIUM 9.7 09/23/2022   PROT 7.4 09/23/2022   ALBUMIN 3.9 09/23/2022   LABGLOB 2.5 08/28/2021   AGRATIO 1.8 08/28/2021   BILITOT 0.5  09/23/2022   ALKPHOS 61 09/23/2022   AST 23 09/23/2022   ALT 18 09/23/2022   ANIONGAP 14 09/23/2022   Last lipids Lab Results  Component Value Date   CHOL 203 (H) 11/24/2022   HDL 46 11/24/2022   LDLCALC 127 (H) 11/24/2022   TRIG 170 (H) 11/24/2022   CHOLHDL 4.4 11/24/2022        Assessment & Plan:     Problem List Items Addressed This Visit       Cardiovascular and Mediastinum   Essential hypertension - Primary    Hypertension at goal plan is to reduce amlodipine to 5 mg daily and observe      Relevant Medications   amLODipine (NORVASC) 10 MG tablet     Digestive   Gastroesophageal reflux disease without esophagitis    Patient with intermittent use of proton pump inhibitor nocturnal cough due to reflux patient encouraged to take pantoprazole daily        Musculoskeletal and Integument   Callus    Care per podiatry        Other   Mixed hyperlipidemia    Continue atorvastatin      Relevant Medications   amLODipine (NORVASC) 10 MG tablet   Pain in both hands    No evidence of carpal tunnel syndrome Give topical Voltaren gel suspect osteoarthritis and obtain x-rays of hands      Relevant Orders   DG HAND 2 VIEWS BILAT   Encounter for screening for lung cancer    Shared decision making used we will get CT of chest low-dose for lung cancer screening      Relevant Orders   CT CHEST LUNG CA SCREEN LOW DOSE W/O CM   Other Visit Diagnoses     Need for immunization against influenza       Relevant Orders   Flu vaccine trivalent PF, 6mos and older(Flulaval,Afluria,Fluarix,Fluzone) (Completed)       As I am retiring will make referral to internal medicine she has insurance coverage Return in about 5 months (around 05/18/2023) for htn.     Shan Levans, MD

## 2022-12-16 NOTE — Assessment & Plan Note (Signed)
Continue atorvastatin

## 2022-12-16 NOTE — Assessment & Plan Note (Signed)
Care per podiatry

## 2022-12-16 NOTE — Patient Instructions (Addendum)
[phone] Tasha Nguyen will call for colon exam in 2026 Flu vaccine given Low dose CT scan of lung for cancer screen ordered Use voltaren gel to hands 2-4 times a day Xray of hand same day as CT  Reduce amlodipine to 1/2 daily, call us in 3 weeks with blood pressure values from home , we may stop based on result or continue  Return for primary care 5 months

## 2022-12-16 NOTE — Assessment & Plan Note (Signed)
No evidence of carpal tunnel syndrome Give topical Voltaren gel suspect osteoarthritis and obtain x-rays of hands

## 2023-02-11 ENCOUNTER — Telehealth: Payer: Self-pay

## 2023-02-11 DIAGNOSIS — F17210 Nicotine dependence, cigarettes, uncomplicated: Secondary | ICD-10-CM

## 2023-02-11 NOTE — Telephone Encounter (Signed)
Pt was called and VM was left informing her that order has been placed by Dr.Wright and I left the information for [facility] for her to schedule.

## 2023-02-11 NOTE — Telephone Encounter (Signed)
Copied from CRM [id number]. Topic: Referral - Request for Referral >> Feb 10, 2023  9:25 AM Everette C wrote: Did the patient discuss referral with their provider in the last year? Yes (If No - schedule appointment) (If Yes - send message)  Appointment offered? Yes  Type of order/referral and detailed reason for visit: Radiology The patient would like a CT scan of their chest and lungs, as previously discussed with their PCP   Preference of office, provider, location: Patient would like to be seen at [facility]   If referral order, have you been seen by this specialty before? No (If Yes, this issue or another issue? When? Where?  Can we respond through MyChart? No

## 2023-03-03 NOTE — Telephone Encounter (Signed)
Copied from CRM [id number]. Topic: General - Inquiry >> Mar 03, 2023  9:01 AM De Blanch wrote: Reason for CRM: Pt stated she has tried to schedule her imaging for CT Chest Lung Cancer Screening. However, she was advised there is an issue with the order, and the office needs to reach out to fix the issue for the pt to be able to schedule her appointment.  Please advise.

## 2023-03-08 NOTE — Telephone Encounter (Signed)
Can new order be placed for patients CT scan. Location: [facility].

## 2023-03-09 NOTE — Addendum Note (Signed)
Addended by: Hoy Register on: 03/09/2023 09:23 AM   Modules accepted: Orders

## 2023-03-09 NOTE — Telephone Encounter (Signed)
Done

## 2023-03-10 NOTE — Telephone Encounter (Signed)
Attempted to contact patient regarding CT order. After viewing mychart patient has already been scheduled for scan

## 2023-03-23 ENCOUNTER — Telehealth: Payer: Self-pay | Admitting: Critical Care Medicine

## 2023-03-23 NOTE — Telephone Encounter (Signed)
Noted  

## 2023-03-23 NOTE — Telephone Encounter (Signed)
Joni Reining called stated the CT Screening tht was scheduled for tomorrow at 10:50 has to be rescheduled as the Berkley Harvey is still pending with insurance

## 2023-03-24 ENCOUNTER — Other Ambulatory Visit: Payer: Commercial Managed Care - HMO

## 2023-04-14 ENCOUNTER — Encounter: Payer: Self-pay | Admitting: Family Medicine

## 2023-04-15 ENCOUNTER — Telehealth: Payer: Self-pay | Admitting: Critical Care Medicine

## 2023-04-15 NOTE — Telephone Encounter (Signed)
 Tasha Nguyen calling form [facility] is calling to report that the CT Thorax low dose for Cancer screening was denied. CB- [phone] option 4 Reference Number [id number]

## 2023-04-19 NOTE — Telephone Encounter (Signed)
 Please cancel CT scan with [facility] imaging.  I am not sure why Dr. Brien ordered a CT scan of the lungs as her chart does not reflect smoking history.  She called to request this order again after Dr. Brien had retired and it was reordered but I have no justification for it as I have never seen her before.

## 2023-04-20 ENCOUNTER — Other Ambulatory Visit: Payer: Self-pay | Admitting: Critical Care Medicine

## 2023-04-20 DIAGNOSIS — Z1231 Encounter for screening mammogram for malignant neoplasm of breast: Secondary | ICD-10-CM

## 2023-04-21 ENCOUNTER — Telehealth: Payer: Self-pay

## 2023-04-21 NOTE — Telephone Encounter (Signed)
 After speaking with patient this am patient is adamant that she is a former smoker

## 2023-04-21 NOTE — Telephone Encounter (Signed)
 Staff Message received from [facility], RT at [facility]. Aurora Blowers voiced that she reached out to the patient to cancel CT that was schedule for tomorrow and she was very adamant that she was a former smoker and I went through the criteria questions and she does in fact qualify and want to proceed with her scan tomorrow.  Advised that  Fax was received 04/20/2022 stating patient was denied CT. Denial letter was sent  to be scan into patient chart. Derwood Flor that we  Called patient to discuss the fax that was received by her insurance provider and was unable to reach, message left on VM also advised that we will  update patient smoking history when we speak with her,  if needed .

## 2023-04-22 ENCOUNTER — Ambulatory Visit
Admission: RE | Admit: 2023-04-22 | Discharge: 2023-04-22 | Disposition: A | Discharge status: Home / Self Care | Payer: Medicare Other | Source: Ambulatory Visit | Attending: Family Medicine | Admitting: Family Medicine

## 2023-04-22 DIAGNOSIS — F17210 Nicotine dependence, cigarettes, uncomplicated: Secondary | ICD-10-CM

## 2023-04-22 NOTE — Telephone Encounter (Signed)
It appears the patient just got her LDCT approved and scan was performed

## 2023-04-28 ENCOUNTER — Ambulatory Visit: Payer: Self-pay | Admitting: Critical Care Medicine

## 2023-04-28 NOTE — Telephone Encounter (Signed)
Copied from CRM [id number]. Topic: Clinical - Lab/Test Results >> Apr 28, 2023 10:21 AM Shon Hale wrote: Reason for CRM: Pt returning call about ct results.   Patient returned call to the office regarding CT scan results from 04/26/23. The result note from the provider states the following "Please inform her that repeat CT lung is recommended in 6 mos due to finding of pulmonary nodules In right lung."  This RN informed patient of the results and recommendation. Patient is inquiring about the process for scheduling the next exam. Please follow up with patient once the order for repeat imaging has been placed.

## 2023-04-28 NOTE — Telephone Encounter (Signed)
This message was sent to [facility]. Pt has appt coming up with Dr. Alvis Lemmings next mth to establish care from Dr. Delford Field.

## 2023-04-30 ENCOUNTER — Ambulatory Visit: Payer: BC Managed Care – PPO

## 2023-05-05 ENCOUNTER — Ambulatory Visit
Admission: RE | Admit: 2023-05-05 | Discharge: 2023-05-05 | Disposition: A | Discharge status: Home / Self Care | Payer: BC Managed Care – PPO | Source: Ambulatory Visit | Attending: Critical Care Medicine | Admitting: Critical Care Medicine

## 2023-05-05 DIAGNOSIS — Z1231 Encounter for screening mammogram for malignant neoplasm of breast: Secondary | ICD-10-CM

## 2023-05-07 ENCOUNTER — Telehealth: Payer: Self-pay

## 2023-05-07 NOTE — Telephone Encounter (Signed)
 Pt was called and vm was left, Information has been sent to nurse pool.

## 2023-05-07 NOTE — Progress Notes (Signed)
Let pt know mammogram normal recheck one year

## 2023-05-07 NOTE — Telephone Encounter (Signed)
-----   Message from Shan Levans sent at 05/07/2023 11:52 AM EST ----- Let pt know mammogram normal recheck one year

## 2023-05-14 ENCOUNTER — Telehealth: Payer: Self-pay | Admitting: Critical Care Medicine

## 2023-05-14 NOTE — Telephone Encounter (Signed)
 Contacted pt left vm to confirm appt! 2/12

## 2023-05-19 ENCOUNTER — Encounter: Payer: Self-pay | Admitting: Family Medicine

## 2023-05-19 ENCOUNTER — Ambulatory Visit: Payer: Medicare Other | Attending: Family Medicine | Admitting: Family Medicine

## 2023-05-19 VITALS — BP 118/85 | HR 80 | Ht 69.0 in | Wt 127.2 lb

## 2023-05-19 DIAGNOSIS — R634 Abnormal weight loss: Secondary | ICD-10-CM

## 2023-05-19 DIAGNOSIS — K219 Gastro-esophageal reflux disease without esophagitis: Secondary | ICD-10-CM | POA: Diagnosis not present

## 2023-05-19 DIAGNOSIS — F1010 Alcohol abuse, uncomplicated: Secondary | ICD-10-CM

## 2023-05-19 DIAGNOSIS — F17210 Nicotine dependence, cigarettes, uncomplicated: Secondary | ICD-10-CM

## 2023-05-19 DIAGNOSIS — R058 Other specified cough: Secondary | ICD-10-CM

## 2023-05-19 DIAGNOSIS — I10 Essential (primary) hypertension: Secondary | ICD-10-CM | POA: Diagnosis not present

## 2023-05-19 DIAGNOSIS — R918 Other nonspecific abnormal finding of lung field: Secondary | ICD-10-CM

## 2023-05-19 MED ORDER — FLUTICASONE PROPIONATE 50 MCG/ACT NA SUSP: 2.0000 | Freq: Every day | NASAL | 6 refills | Status: DC

## 2023-05-19 MED ORDER — AMLODIPINE BESYLATE 5 MG PO TABS
5.0000 mg | ORAL_TABLET | Freq: Every day | ORAL | 1 refills | Status: DC
Start: 2023-05-19 — End: 2023-10-18

## 2023-05-19 MED ORDER — CETIRIZINE HCL 10 MG PO TABS: 10.0000 mg | ORAL_TABLET | Freq: Every day | ORAL | 1 refills | Status: DC

## 2023-05-19 MED ORDER — MIRTAZAPINE 15 MG PO TBDP
15.0000 mg | ORAL_TABLET | Freq: Every day | ORAL | 1 refills | Status: DC
Start: 2023-05-19 — End: 2023-10-18

## 2023-05-19 NOTE — Progress Notes (Signed)
Subjective:  Patient ID: Tasha Nguyen, female    DOB: Aug 09, 1955  Age: 68 y.o. MRN: [id number]  CC: Medical Management of Chronic Issues (Coughing/Discuss weight)   HPI Tasha Nguyen is a 68 y.o. year old female with a history of hypertension, GERD, previous nicotine dependence quit smoking 7-10 years ago and prior to that smoked 1-1.5 ppd x25 years.  Interval History: Discussed the use of AI scribe software for clinical note transcription with the patient, who gave verbal consent to proceed.  She presents with a chronic cough and concerns about weight loss. The cough is productive with clear sputum and has not improved with Delsym.  The patient also reports a lack of appetite and a significant weight loss of about 10 pounds over the past five months. 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  The patient quit smoking about 7-10 years ago, after smoking 1.5-2 packs a day for about 25 years. A recent CT scan revealed nodules in the lungs, which are suspected to be benign, but a follow-up scan is scheduled in six months. The patient also reports a history of acid reflux, for which she is taking Protonix.        Past Medical History:  Diagnosis Date   Colon polyps     Past Surgical History:  Procedure Laterality Date   none      Family History  Problem Relation Age of Onset   Diabetes Mother    Ovarian cancer Mother 92   CAD Father    CAD Sister    Diabetes Sister    Diabetes Sister    Breast cancer Cousin 22   Colon cancer Neg Hx    Esophageal cancer Neg Hx    Stomach cancer Neg Hx    Pancreatic cancer Neg Hx    Liver disease Neg Hx    Rectal cancer Neg Hx    BRCA 1/2 Neg Hx     Social History   Socioeconomic History   Marital status: Single    Spouse name: Not on file   Number of children: Not on file   Years of education:  Not on file   Highest education level: Not on file  Occupational History   Not on file  Tobacco Use   Smoking status: Former    Current packs/day: 0.00    Types: Cigarettes    Quit date: 05/13/2015    Years since quitting: 8.0   Smokeless tobacco: Never  Vaping Use   Vaping status: Never Used  Substance and Sexual Activity   Alcohol use: Yes    Comment: weekends-4-5 beers   Drug use: Yes    Types: Marijuana    Comment: occ   Sexual activity: Yes    Birth control/protection: None  Other Topics Concern   Not on file  Social History Narrative   Not on file   Social Drivers of Health   Financial Resource Strain: Not on file  Food Insecurity: Not on file  Transportation Needs: Not on file  Physical Activity: Not on file  Stress: Not on file  Social Connections: Not on file    Allergies  Allergen Reactions   Hydrocodone-Acetaminophen     Other reaction(s): Other (See Comments) unknown   Oxycodone Other (See Comments)    Hallucinations Other reaction(s): Other (See Comments) Hallucinations  Oxycodone-Acetaminophen     Other reaction(s): Other (See Comments) unknown    Outpatient Medications Prior to Visit  Medication Sig Dispense Refill   atorvastatin (LIPITOR) 20 MG tablet Take 1 tablet (20 mg total) by mouth daily. 90 tablet 1   diclofenac Sodium (VOLTAREN) 1 % GEL Apply 2 g topically 4 (four) times daily. 50 g 0   pantoprazole (PROTONIX) 40 MG tablet Take 1 tablet (40 mg total) by mouth daily. 60 tablet 3   amLODipine (NORVASC) 10 MG tablet Take 0.5 tablets (5 mg total) by mouth daily.     No facility-administered medications prior to visit.     ROS Review of Systems  Constitutional:  Positive for unexpected weight change. Negative for activity change and appetite change.  HENT:  Negative for sinus pressure and sore throat.   Respiratory:  Positive for cough. Negative for chest tightness, shortness of breath and wheezing.   Cardiovascular:  Negative for  chest pain and palpitations.  Gastrointestinal:  Negative for abdominal distention, abdominal pain and constipation.  Genitourinary: Negative.   Musculoskeletal: Negative.   Psychiatric/Behavioral:  Negative for behavioral problems and dysphoric mood.     Objective:  BP 118/85   Pulse 80   Ht 5\' 9"  (1.753 m)   Wt 127 lb 3.2 oz (57.7 kg)   BMI 18.78 kg/m      05/19/2023    8:40 AM 12/16/2022    8:58 AM 11/24/2022    4:22 PM  BP/Weight  Systolic BP 118 119 136  Diastolic BP 85 77 99  Wt. (Lbs) 127.2 138 136.4  BMI 18.78 kg/m2 20.38 kg/m2 20.14 kg/m2      Physical Exam Constitutional:      Appearance: She is well-developed.  Cardiovascular:     Rate and Rhythm: Normal rate.     Heart sounds: Normal heart sounds. No murmur heard. Pulmonary:     Effort: Pulmonary effort is normal.     Breath sounds: Normal breath sounds. No wheezing or rales.  Chest:     Chest wall: No tenderness.  Abdominal:     General: Bowel sounds are normal. There is no distension.     Palpations: Abdomen is soft. There is no mass.     Tenderness: There is no abdominal tenderness.  Musculoskeletal:        General: Normal range of motion.     Right lower leg: No edema.     Left lower leg: No edema.  Neurological:     Mental Status: She is alert and oriented to person, place, and time.  Psychiatric:        Mood and Affect: Mood normal.        Latest Ref Rng & Units 09/23/2022    3:22 PM 08/28/2021    4:30 PM 12/17/2020    9:35 AM  CMP  Glucose 70 - 99 mg/dL 63  90  79   BUN 8 - 23 mg/dL 9  12  17    Creatinine 0.44 - 1.00 mg/dL 9.14  7.82  9.56   Sodium 135 - 145 mmol/L 139  141  143   Potassium 3.5 - 5.1 mmol/L 4.2  4.3  4.3   Chloride 98 - 111 mmol/L 105  105  106   CO2 22 - 32 mmol/L 20  18  21    Calcium 8.9 - 10.3 mg/dL 9.7  9.6  9.6   Total Protein 6.5 - 8.1 g/dL 7.4  6.9  6.7   Total Bilirubin 0.3 -  1.2 mg/dL 0.5  0.3  0.2   Alkaline Phos 38 - 126 U/L 61  73  69   AST 15 - 41 U/L  23  18  20    ALT 0 - 44 U/L 18  16  14      Lipid Panel     Component Value Date/Time   CHOL 203 (H) 11/24/2022 1653   TRIG 170 (H) 11/24/2022 1653   HDL 46 11/24/2022 1653   CHOLHDL 4.4 11/24/2022 1653   CHOLHDL 7.2 06/08/2008 0125   VLDL 37 06/08/2008 0125   LDLCALC 127 (H) 11/24/2022 1653    CBC    Component Value Date/Time   WBC 5.8 09/23/2022 1522   RBC 4.53 09/23/2022 1522   HGB 14.9 09/23/2022 1522   HGB 14.3 12/17/2020 0935   HCT 44.1 09/23/2022 1522   HCT 43.1 12/17/2020 0935   PLT 355 09/23/2022 1522   PLT 380 12/17/2020 0935   MCV 97.4 09/23/2022 1522   MCV 96 12/17/2020 0935   MCH 32.9 09/23/2022 1522   MCHC 33.8 09/23/2022 1522   RDW 14.4 09/23/2022 1522   RDW 13.0 12/17/2020 0935   LYMPHSABS 1.8 09/23/2022 1522   LYMPHSABS 2.6 12/17/2020 0935   MONOABS 0.3 09/23/2022 1522   EOSABS 0.0 09/23/2022 1522   EOSABS 0.1 12/17/2020 0935   BASOSABS 0.0 09/23/2022 1522   BASOSABS 0.0 12/17/2020 0935    Lab Results  Component Value Date   HGBA1C 5.6 06/25/2021    Lab Results  Component Value Date   TSH 0.782 08/28/2021    Assessment & Plan:      Pulmonary Nodules Benign nodules identified on CT scan with recommendation for short-term follow-up in six months. -Schedule follow-up appointment in five months to order repeat CT scan given history of previous nicotine dependence  Chronic Cough Persistent despite Delsym treatment. Mucus production noted. Possible sinus involvement. -Order pulmonary function test to rule out COPD. -Prescribe sinus medication and nasal spray.  Unintentional Weight Loss Lost 10 pounds over five months. Reports poor appetite and heavy alcohol consumption. -Order thyroid function tests to rule out hyperthyroidism, CA 19-9 ordered -Breast cancer screen was negative; Pap smear no longer indicated due to age -Prescribe Remeron 15mg  at bedtime to increase appetite and promote weight gain. -Consider CT scan of the abdomen if  weight loss continues.  Alcohol Use Disorder Reports heavy alcohol consumption. -Prescribe Naltrexone 50mg  daily to decrease alcohol cravings.  Hypertension -Currently managed with 5mg  of amlodipine -Continue current medication regimen.  Hyperlipidemia Last cholesterol check in August 2024 showed elevated levels. -Continue statin -Order lipid panel.  General Health Maintenance: -Update smoking history in chart. -Continue acid reflux medication. -No need for Pap smear after age 1 unless symptomatic.          Meds ordered this encounter  Medications   cetirizine (ZYRTEC) 10 MG tablet    Sig: Take 1 tablet (10 mg total) by mouth daily.    Dispense:  30 tablet    Refill:  1   fluticasone (FLONASE) 50 MCG/ACT nasal spray    Sig: Place 2 sprays into both nostrils daily.    Dispense:  16 g    Refill:  6   mirtazapine (REMERON SOL-TAB) 15 MG disintegrating tablet    Sig: Take 1 tablet (15 mg total) by mouth at bedtime.    Dispense:  90 tablet    Refill:  1   amLODipine (NORVASC) 5 MG tablet    Sig: Take 1 tablet (5  mg total) by mouth daily.    Dispense:  90 tablet    Refill:  1    Follow-up: Return in about 5 months (around 10/16/2023) for Repeat chest CT.       Hoy Register, MD, FAAFP. [facility] [place], [place] [phone]   05/19/2023, 9:14 AM

## 2023-05-19 NOTE — Patient Instructions (Signed)
VISIT SUMMARY:  During today's visit, we discussed your chronic cough, recent weight loss, and other health concerns. We reviewed your recent CT scan results, which showed benign lung nodules, and planned follow-up care. We also addressed your alcohol consumption and its impact on your health. Additionally, we reviewed your current medications and general health maintenance.  YOUR PLAN:  -PULMONARY NODULES: Pulmonary nodules are small growths in the lungs that are often benign. Your recent CT scan showed these nodules, and we will schedule a follow-up scan in six months to monitor them. Please schedule a follow-up appointment in five months to order the repeat CT scan.  -CHRONIC COUGH: A chronic cough is a cough that lasts for an extended period. Despite using Delsym, your cough persists and produces mucus. We will order a pulmonary function test to rule out chronic obstructive pulmonary disease (COPD) and prescribe sinus medication and a nasal spray to help with possible sinus involvement.  -UNINTENTIONAL WEIGHT LOSS: Unintentional weight loss is losing weight without trying. You have lost 10 pounds over the past five months, which may be due to poor appetite and heavy alcohol consumption. We will order thyroid function tests to rule out hyperthyroidism and prescribe Remeron 15mg  at bedtime to help increase your appetite and promote weight gain. If the weight loss continues, we may consider another CT scan.  -ALCOHOL USE DISORDER: Alcohol use disorder is a medical condition characterized by an inability to stop or control alcohol use. You reported heavy alcohol consumption, and we will prescribe Naltrexone 50mg  daily to help decrease your alcohol cravings.  -HYPERTENSION: Hypertension is high blood pressure. Your condition is currently managed with a 5mg  dose of your blood pressure medication. Continue taking your current medication as prescribed.  -HYPERLIPIDEMIA: Hyperlipidemia is having high  levels of fats (lipids) in the blood, such as cholesterol. Your last cholesterol check in August 2024 showed elevated levels, so we will order a lipid panel to recheck your cholesterol levels.  -GENERAL HEALTH MAINTENANCE: We updated your smoking history in your chart and will continue your acid reflux medication. No need for a Pap smear after age 85 unless you have symptoms.  INSTRUCTIONS:  Please schedule a follow-up appointment in five months to order a repeat CT scan for your lung nodules. Additionally, we will need to conduct a pulmonary function test, thyroid function tests, and a lipid panel. Continue taking your current medications as prescribed and follow the new prescriptions provided. If you have any concerns or notice any changes in your symptoms, please contact our office.

## 2023-05-20 ENCOUNTER — Other Ambulatory Visit: Payer: Self-pay | Admitting: Family Medicine

## 2023-05-20 LAB — CMP14+EGFR
ALT: 15 [IU]/L (ref 0–32)
AST: 23 [IU]/L (ref 0–40)
Albumin: 4.3 g/dL (ref 3.9–4.9)
Alkaline Phosphatase: 88 [IU]/L (ref 44–121)
BUN/Creatinine Ratio: 11 — ABNORMAL LOW (ref 12–28)
BUN: 7 mg/dL — ABNORMAL LOW (ref 8–27)
Bilirubin Total: 0.3 mg/dL (ref 0.0–1.2)
CO2: 21 mmol/L (ref 20–29)
Calcium: 10.2 mg/dL (ref 8.7–10.3)
Chloride: 104 mmol/L (ref 96–106)
Creatinine, Ser: 0.64 mg/dL (ref 0.57–1.00)
Globulin, Total: 2.8 g/dL (ref 1.5–4.5)
Glucose: 82 mg/dL (ref 70–99)
Potassium: 4.5 mmol/L (ref 3.5–5.2)
Sodium: 140 mmol/L (ref 134–144)
Total Protein: 7.1 g/dL (ref 6.0–8.5)
eGFR: 98 mL/min/{1.73_m2} (ref >59)

## 2023-05-20 LAB — T4, FREE: Free T4: 1.28 ng/dL (ref 0.82–1.77)

## 2023-05-20 LAB — LP+NON-HDL CHOLESTEROL
Cholesterol, Total: 215 mg/dL — ABNORMAL HIGH (ref 100–199)
HDL: 58 mg/dL (ref >39)
LDL Chol Calc (NIH): 131 mg/dL — ABNORMAL HIGH (ref 0–99)
Total Non-HDL-Chol (LDL+VLDL): 157 mg/dL — ABNORMAL HIGH (ref 0–129)
Triglycerides: 145 mg/dL (ref 0–149)
VLDL Cholesterol Cal: 26 mg/dL (ref 5–40)

## 2023-05-20 LAB — T3: T3, Total: 107 ng/dL (ref 71–180)

## 2023-05-20 LAB — CANCER ANTIGEN 19-9: CA 19-9: <2 U/mL (ref 0–35)

## 2023-05-20 LAB — TSH: TSH: 1.28 u[IU]/mL (ref 0.450–4.500)

## 2023-05-20 MED ORDER — ATORVASTATIN CALCIUM 40 MG PO TABS: 40.0000 mg | ORAL_TABLET | Freq: Every day | ORAL | 1 refills | Status: DC

## 2023-05-21 ENCOUNTER — Telehealth: Payer: Self-pay

## 2023-05-21 NOTE — Telephone Encounter (Signed)
Noted.    Copied from CRM [id number]. Topic: Clinical - Lab/Test Results >> May 21, 2023  4:14 PM Phill Myron wrote: Patient Tasha Nguyen called and  I gave her the results Verbatim.  Tasha Register, MD 05/20/2023  2:11 PM EST   Please Inform her that her kidney and liver functions are normal.  Cholesterol is elevated and so I increased atorvastatin from 20 mg to 40 mg.  Thank you  Tasha Nguyen said thank you and she had no questions.

## 2023-06-15 ENCOUNTER — Ambulatory Visit (HOSPITAL_COMMUNITY)
Admission: RE | Admit: 2023-06-15 | Discharge: 2023-06-15 | Disposition: A | Discharge status: Home / Self Care | Source: Ambulatory Visit | Attending: Family Medicine | Admitting: Family Medicine

## 2023-06-15 DIAGNOSIS — Z87891 Personal history of nicotine dependence: Secondary | ICD-10-CM | POA: Insufficient documentation

## 2023-06-15 DIAGNOSIS — J984 Other disorders of lung: Secondary | ICD-10-CM | POA: Insufficient documentation

## 2023-06-15 DIAGNOSIS — F17210 Nicotine dependence, cigarettes, uncomplicated: Secondary | ICD-10-CM | POA: Diagnosis present

## 2023-06-15 DIAGNOSIS — R058 Other specified cough: Secondary | ICD-10-CM | POA: Insufficient documentation

## 2023-06-15 LAB — PULMONARY FUNCTION TEST
DL/VA % pred: 74 %
DL/VA: 3.01 ml/min/mmHg/L
DLCO unc % pred: 63 %
DLCO unc: 14.74 ml/min/mmHg
FEF 25-75 Post: 0.81 L/s
FEF 25-75 Pre: 0.6 L/s
FEF2575-%Change-Post: 35 %
FEF2575-%Pred-Post: 33 %
FEF2575-%Pred-Pre: 24 %
FEV1-%Change-Post: 12 %
FEV1-%Pred-Post: 60 %
FEV1-%Pred-Pre: 54 %
FEV1-Post: 1.77 L
FEV1-Pre: 1.58 L
FEV1FVC-%Change-Post: 4 %
FEV1FVC-%Pred-Pre: 68 %
FEV6-%Change-Post: 8 %
FEV6-%Pred-Post: 80 %
FEV6-%Pred-Pre: 74 %
FEV6-Post: 2.95 L
FEV6-Pre: 2.72 L
FEV6FVC-%Change-Post: 0 %
FEV6FVC-%Pred-Post: 95 %
FEV6FVC-%Pred-Pre: 95 %
FVC-%Change-Post: 7 %
FVC-%Pred-Post: 84 %
FVC-%Pred-Pre: 78 %
FVC-Post: 3.2 L
FVC-Pre: 2.97 L
Post FEV1/FVC ratio: 55 %
Post FEV6/FVC ratio: 92 %
Pre FEV1/FVC ratio: 53 %
Pre FEV6/FVC Ratio: 92 %
RV % pred: 151 %
RV: 3.54 L
TLC % pred: 116 %
TLC: 6.75 L

## 2023-06-15 MED ORDER — ALBUTEROL SULFATE (2.5 MG/3ML) 0.083% IN NEBU
2.5000 mg | INHALATION_SOLUTION | Freq: Once | RESPIRATORY_TRACT | Status: AC
  Administered 2023-06-15: 2.5 mg via RESPIRATORY_TRACT

## 2023-06-21 ENCOUNTER — Other Ambulatory Visit: Payer: Self-pay | Admitting: Family Medicine

## 2023-06-21 DIAGNOSIS — J449 Chronic obstructive pulmonary disease, unspecified: Secondary | ICD-10-CM | POA: Insufficient documentation

## 2023-06-21 MED ORDER — ALBUTEROL SULFATE HFA 108 (90 BASE) MCG/ACT IN AERS: 2.0000 | INHALATION_SPRAY | Freq: Four times a day (QID) | RESPIRATORY_TRACT | 2 refills | Status: AC | PRN

## 2023-06-21 MED ORDER — SPIRIVA RESPIMAT 2.5 MCG/ACT IN AERS
2.0000 | INHALATION_SPRAY | Freq: Every day | RESPIRATORY_TRACT | 3 refills | Status: DC
Start: 2023-06-21 — End: 2023-10-18

## 2023-10-15 ENCOUNTER — Telehealth: Payer: Self-pay | Admitting: Family Medicine

## 2023-10-15 NOTE — Telephone Encounter (Signed)
 Contacted pt left vm to confirmed appt

## 2023-10-18 ENCOUNTER — Encounter: Payer: Self-pay | Admitting: Family Medicine

## 2023-10-18 ENCOUNTER — Ambulatory Visit: Payer: BC Managed Care – PPO | Attending: Family Medicine | Admitting: Family Medicine

## 2023-10-18 VITALS — BP 154/92 | HR 71 | Ht 69.0 in | Wt 125.6 lb

## 2023-10-18 DIAGNOSIS — M21612 Bunion of left foot: Secondary | ICD-10-CM | POA: Diagnosis not present

## 2023-10-18 DIAGNOSIS — E782 Mixed hyperlipidemia: Secondary | ICD-10-CM

## 2023-10-18 DIAGNOSIS — R7303 Prediabetes: Secondary | ICD-10-CM

## 2023-10-18 DIAGNOSIS — R634 Abnormal weight loss: Secondary | ICD-10-CM | POA: Diagnosis not present

## 2023-10-18 DIAGNOSIS — J449 Chronic obstructive pulmonary disease, unspecified: Secondary | ICD-10-CM

## 2023-10-18 DIAGNOSIS — I10 Essential (primary) hypertension: Secondary | ICD-10-CM

## 2023-10-18 DIAGNOSIS — R918 Other nonspecific abnormal finding of lung field: Secondary | ICD-10-CM

## 2023-10-18 DIAGNOSIS — E2839 Other primary ovarian failure: Secondary | ICD-10-CM

## 2023-10-18 HISTORY — DX: Prediabetes: R73.03

## 2023-10-18 LAB — POCT GLYCOSYLATED HEMOGLOBIN (HGB A1C): HbA1c, POC (prediabetic range): 5.4 % — AB (ref 5.7–6.4)

## 2023-10-18 MED ORDER — UMECLIDINIUM BROMIDE 62.5 MCG/ACT IN AEPB: 1.0000 | INHALATION_SPRAY | Freq: Every day | RESPIRATORY_TRACT | 6 refills | Status: DC

## 2023-10-18 MED ORDER — MIRTAZAPINE 30 MG PO TBDP: 30.0000 mg | ORAL_TABLET | Freq: Every day | ORAL | 1 refills | Status: DC

## 2023-10-18 MED ORDER — AMLODIPINE BESYLATE 5 MG PO TABS: 5.0000 mg | ORAL_TABLET | Freq: Every day | ORAL | 1 refills | Status: AC

## 2023-10-18 MED ORDER — ATORVASTATIN CALCIUM 40 MG PO TABS: 40.0000 mg | ORAL_TABLET | Freq: Every day | ORAL | 1 refills | Status: AC

## 2023-10-18 NOTE — Progress Notes (Signed)
 Subjective:  Patient ID: Tasha Nguyen, female    DOB: May 17, 1955  Age: 68 y.o. MRN: [id number]  CC: Medical Management of Chronic Issues     Discussed the use of AI scribe software for clinical note transcription with the patient, who gave verbal consent to proceed.  History of Present Illness SISTER CARBONE is a 68 year old female with hypertension, GERD, previous nicotine dependence quit smoking 7-10 years ago and prior to that smoked 1-1.5 ppd x25 years, pulmonary nodules who presents for follow-up and weight gain concerns.  She is experiencing difficulty gaining weight despite efforts and has been prescribed Remeron  to stimulate appetite, which has not been effective. She is concerned that constant exposure to food at work may be affecting her appetite.  She works at [employer] and another [employer] place.  She has lung nodules in the right lung with a repeat CT scan ordered. COPD is managed with an albuterol  inhaler used approximately every six hours for dyspnea. She is unable to obtain Spiriva  due to cost and insurance issues.  Her medical history includes prediabetes with an A1c of 5.4, hypertension with a current reading of 176/93, possibly due to missing her medication, and hyperlipidemia managed with atorvastatin . She quit smoking ten years ago. She works two jobs, which limits her rest time, and enjoys eating cheese and salads, though these are not aiding in weight gain. She denies leg pain when walking.  She has left foot bunion and is requesting podiatry referral for evaluation.  Past Medical History:  Diagnosis Date   Colon polyps     Past Surgical History:  Procedure Laterality Date   none      Family History  Problem Relation Age of Onset   Diabetes Mother    Ovarian cancer Mother 67   CAD Father    CAD Sister    Diabetes Sister    Diabetes Sister    Breast cancer Cousin 22   Colon cancer Neg Hx    Esophageal cancer Neg Hx    Stomach cancer Neg  Hx    Pancreatic cancer Neg Hx    Liver disease Neg Hx    Rectal cancer Neg Hx    BRCA 1/2 Neg Hx     Social History   Socioeconomic History   Marital status: Single    Spouse name: Not on file   Number of children: Not on file   Years of education: Not on file   Highest education level: Not on file  Occupational History   Not on file  Tobacco Use   Smoking status: Former    Current packs/day: 0.00    Average packs/day: 1 pack/day for 26.1 years (26.1 ttl pk-yrs)    Types: Cigarettes    Start date: 04/18/1987    Quit date: 05/12/2013    Years since quitting: 10.4   Smokeless tobacco: Never  Vaping Use   Vaping status: Never Used  Substance and Sexual Activity   Alcohol use: Yes    Comment: weekends-4-5 beers   Drug use: Yes    Types: Marijuana    Comment: occ   Sexual activity: Yes    Birth control/protection: None  Other Topics Concern   Not on file  Social History Narrative   Not on file   Social Drivers of Health   Financial Resource Strain: Not on file  Food Insecurity: Not on file  Transportation Needs: Not on file  Physical Activity: Not on file  Stress: Not  on file  Social Connections: Not on file    Allergies  Allergen Reactions   Hydrocodone-Acetaminophen      Other reaction(s): Other (See Comments) unknown   Oxycodone Other (See Comments)    Hallucinations Other reaction(s): Other (See Comments) Hallucinations   Oxycodone-Acetaminophen      Other reaction(s): Other (See Comments) unknown    Outpatient Medications Prior to Visit  Medication Sig Dispense Refill   albuterol  (VENTOLIN  HFA) 108 (90 Base) MCG/ACT inhaler Inhale 2 puffs into the lungs every 6 (six) hours as needed for wheezing or shortness of breath. 8 g 2   cetirizine  (ZYRTEC ) 10 MG tablet Take 1 tablet (10 mg total) by mouth daily. 30 tablet 1   diclofenac  Sodium (VOLTAREN ) 1 % GEL Apply 2 g topically 4 (four) times daily. 50 g 0   fluticasone  (FLONASE ) 50 MCG/ACT nasal spray  Place 2 sprays into both nostrils daily. 16 g 6   pantoprazole  (PROTONIX ) 40 MG tablet Take 1 tablet (40 mg total) by mouth daily. 60 tablet 3   amLODipine  (NORVASC ) 5 MG tablet Take 1 tablet (5 mg total) by mouth daily. 90 tablet 1   atorvastatin  (LIPITOR) 40 MG tablet Take 1 tablet (40 mg total) by mouth daily. 90 tablet 1   mirtazapine  (REMERON  SOL-TAB) 15 MG disintegrating tablet Take 1 tablet (15 mg total) by mouth at bedtime. 90 tablet 1   Tiotropium Bromide Monohydrate  (SPIRIVA  RESPIMAT) 2.5 MCG/ACT AERS Inhale 2 puffs into the lungs daily. 1 each 3   No facility-administered medications prior to visit.     ROS Review of Systems  Constitutional:  Negative for activity change and appetite change.  HENT:  Negative for sinus pressure and sore throat.   Respiratory:  Negative for chest tightness, shortness of breath and wheezing.   Cardiovascular:  Negative for chest pain and palpitations.  Gastrointestinal:  Negative for abdominal distention, abdominal pain and constipation.  Genitourinary: Negative.   Musculoskeletal: Negative.   Psychiatric/Behavioral:  Negative for behavioral problems and dysphoric mood.     Objective:  BP (!) 154/92   Pulse 71   Ht 5' 9 (1.753 m)   Wt 125 lb 9.6 oz (57 kg)   SpO2 100%   BMI 18.55 kg/m      10/18/2023    9:35 AM 10/18/2023    9:06 AM 05/19/2023    8:40 AM  BP/Weight  Systolic BP 154 176 118  Diastolic BP 92 93 85  Wt. (Lbs)  125.6 127.2  BMI  18.55 kg/m2 18.78 kg/m2      Physical Exam Constitutional:      Appearance: She is well-developed.  Cardiovascular:     Rate and Rhythm: Normal rate.     Heart sounds: Normal heart sounds. No murmur heard. Pulmonary:     Effort: Pulmonary effort is normal.     Breath sounds: Normal breath sounds. No wheezing or rales.  Chest:     Chest wall: No tenderness.  Abdominal:     General: Bowel sounds are normal. There is no distension.     Palpations: Abdomen is soft. There is no mass.      Tenderness: There is no abdominal tenderness.  Musculoskeletal:        General: Normal range of motion.     Right lower leg: No edema.     Left lower leg: No edema.  Neurological:     Mental Status: She is alert and oriented to person, place, and time.  Psychiatric:  Mood and Affect: Mood normal.        Latest Ref Rng & Units 05/19/2023    9:27 AM 09/23/2022    3:22 PM 08/28/2021    4:30 PM  CMP  Glucose 70 - 99 mg/dL 82  63  90   BUN 8 - 27 mg/dL 7  9  12    Creatinine 0.57 - 1.00 mg/dL 9.35  9.39  9.38   Sodium 134 - 144 mmol/L 140  139  141   Potassium 3.5 - 5.2 mmol/L 4.5  4.2  4.3   Chloride 96 - 106 mmol/L 104  105  105   CO2 20 - 29 mmol/L 21  20  18    Calcium  8.7 - 10.3 mg/dL 89.7  9.7  9.6   Total Protein 6.0 - 8.5 g/dL 7.1  7.4  6.9   Total Bilirubin 0.0 - 1.2 mg/dL 0.3  0.5  0.3   Alkaline Phos 44 - 121 IU/L 88  61  73   AST 0 - 40 IU/L 23  23  18    ALT 0 - 32 IU/L 15  18  16      Lipid Panel     Component Value Date/Time   CHOL 215 (H) 05/19/2023 0927   TRIG 145 05/19/2023 0927   HDL 58 05/19/2023 0927   CHOLHDL 4.4 11/24/2022 1653   CHOLHDL 7.2 06/08/2008 0125   VLDL 37 06/08/2008 0125   LDLCALC 131 (H) 05/19/2023 0927    CBC    Component Value Date/Time   WBC 5.8 09/23/2022 1522   RBC 4.53 09/23/2022 1522   HGB 14.9 09/23/2022 1522   HGB 14.3 12/17/2020 0935   HCT 44.1 09/23/2022 1522   HCT 43.1 12/17/2020 0935   PLT 355 09/23/2022 1522   PLT 380 12/17/2020 0935   MCV 97.4 09/23/2022 1522   MCV 96 12/17/2020 0935   MCH 32.9 09/23/2022 1522   MCHC 33.8 09/23/2022 1522   RDW 14.4 09/23/2022 1522   RDW 13.0 12/17/2020 0935   LYMPHSABS 1.8 09/23/2022 1522   LYMPHSABS 2.6 12/17/2020 0935   MONOABS 0.3 09/23/2022 1522   EOSABS 0.0 09/23/2022 1522   EOSABS 0.1 12/17/2020 0935   BASOSABS 0.0 09/23/2022 1522   BASOSABS 0.0 12/17/2020 0935    Lab Results  Component Value Date   HGBA1C 5.4 (A) 10/18/2023   Lab Results  Component Value  Date   HGBA1C 5.4 (A) 10/18/2023   HGBA1C 5.6 06/25/2021   HGBA1C 5.7 (H) 12/17/2020       1. Prediabetes (Primary) A1c of 5.4 which has normalized Prediabetes has been reversed Continue lifestyle modifications - POCT glycosylated hemoglobin (Hb A1C)  2. Weight loss Malignancy workup so far has been negative Increased Remeron  dose - mirtazapine  (REMERON  SOL-TAB) 30 MG disintegrating tablet; Take 1 tablet (30 mg total) by mouth at bedtime.  Dispense: 90 tablet; Refill: 1  3. Chronic obstructive pulmonary disease, unspecified COPD type (HCC) Stable Unable to obtain Spiriva  due to insurance coverage Will switch to Incruse  4. Bunion of left foot - Ambulatory referral to Podiatry  5. Essential hypertension Uncontrolled due to not taking antihypertensive this morning Blood pressure at last visit was normal hence I will make no changes today Counseled on blood pressure goal of less than 130/80, low-sodium, DASH diet, medication compliance, 150 minutes of moderate intensity exercise per week. Discussed medication compliance, adverse effects. - CMP14+EGFR - amLODipine  (NORVASC ) 5 MG tablet; Take 1 tablet (5 mg total) by mouth daily.  Dispense: 90 tablet; Refill: 1  6. Pulmonary nodules - CT CHEST LCS NODULE F/U LOW DOSE WO CONTRAST; Future  7. Mixed hyperlipidemia Uncontrolled Atorvastatin  dose had been increased from 20 to 40 mg at last visit Will check lipid panel again Low-cholesterol diet - LP+Non-HDL Cholesterol  8. Estrogen deficiency - DG Bone Density; Future   Meds ordered this encounter  Medications   mirtazapine  (REMERON  SOL-TAB) 30 MG disintegrating tablet    Sig: Take 1 tablet (30 mg total) by mouth at bedtime.    Dispense:  90 tablet    Refill:  1    Dose increase   amLODipine  (NORVASC ) 5 MG tablet    Sig: Take 1 tablet (5 mg total) by mouth daily.    Dispense:  90 tablet    Refill:  1   atorvastatin  (LIPITOR) 40 MG tablet    Sig: Take 1 tablet (40 mg  total) by mouth daily.    Dispense:  90 tablet    Refill:  1    Dose increase   umeclidinium bromide  (INCRUSE ELLIPTA ) 62.5 MCG/ACT AEPB    Sig: Inhale 1 puff into the lungs daily.    Dispense:  30 each    Refill:  6    Discontinue Spiriva     Follow-up: Return in about 3 months (around 01/18/2024).       Corrina Sabin, MD, FAAFP. [facility] [place], [place] [phone]   10/18/2023, 10:48 AM

## 2023-10-18 NOTE — Patient Instructions (Signed)
 Managing Your Hypertension Hypertension, also called high blood pressure, is when the force of the blood pressing against the walls of the arteries is too strong. Arteries are blood vessels that carry blood from your heart throughout your body. Hypertension forces the heart to work harder to pump blood and may cause the arteries to become narrow or stiff. Understanding blood pressure readings A blood pressure reading includes a higher number over a lower number: The first, or top, number is called the systolic pressure. It is a measure of the pressure in your arteries as your heart beats. The second, or bottom number, is called the diastolic pressure. It is a measure of the pressure in your arteries as the heart relaxes. For most people, a normal blood pressure is below 120/80. Your personal target blood pressure may vary depending on your medical conditions, your age, and other factors. Blood pressure is classified into four stages. Based on your blood pressure reading, your health care provider may use the following stages to determine what type of treatment you need, if any. Systolic pressure and diastolic pressure are measured in a unit called millimeters of mercury (mmHg). Normal Systolic pressure: below 120. Diastolic pressure: below 80. Elevated Systolic pressure: 120-129. Diastolic pressure: below 80. Hypertension stage 1 Systolic pressure: 130-139. Diastolic pressure: 80-89. Hypertension stage 2 Systolic pressure: 140 or above. Diastolic pressure: 90 or above. How can this condition affect me? Managing your hypertension is very important. Over time, hypertension can damage the arteries and decrease blood flow to parts of the body, including the brain, heart, and kidneys. Having untreated or uncontrolled hypertension can lead to: A heart attack. A stroke. A weakened blood vessel (aneurysm). Heart failure. Kidney damage. Eye damage. Memory and concentration problems. Vascular  dementia. What actions can I take to manage this condition? Hypertension can be managed by making lifestyle changes and possibly by taking medicines. Your health care provider will help you make a plan to bring your blood pressure within a normal range. You may be referred for counseling on a healthy diet and physical activity. Nutrition  Eat a diet that is high in fiber and potassium, and low in salt (sodium), added sugar, and fat. An example eating plan is called the DASH diet. DASH stands for Dietary Approaches to Stop Hypertension. To eat this way: Eat plenty of fresh fruits and vegetables. Try to fill one-half of your plate at each meal with fruits and vegetables. Eat whole grains, such as whole-wheat pasta, brown rice, or whole-grain bread. Fill about one-fourth of your plate with whole grains. Eat low-fat dairy products. Avoid fatty cuts of meat, processed or cured meats, and poultry with skin. Fill about one-fourth of your plate with lean proteins such as fish, chicken without skin, beans, eggs, and tofu. Avoid pre-made and processed foods. These tend to be higher in sodium, added sugar, and fat. Reduce your daily sodium intake. Many people with hypertension should eat less than 1,500 mg of sodium a day. Lifestyle  Work with your health care provider to maintain a healthy body weight or to lose weight. Ask what an ideal weight is for you. Get at least 30 minutes of exercise that causes your heart to beat faster (aerobic exercise) most days of the week. Activities may include walking, swimming, or biking. Include exercise to strengthen your muscles (resistance exercise), such as weight lifting, as part of your weekly exercise routine. Try to do these types of exercises for 30 minutes at least 3 days a week. Do  not use any products that contain nicotine or tobacco. These products include cigarettes, chewing tobacco, and vaping devices, such as e-cigarettes. If you need help quitting, ask your  health care provider. Control any long-term (chronic) conditions you have, such as high cholesterol or diabetes. Identify your sources of stress and find ways to manage stress. This may include meditation, deep breathing, or making time for fun activities. Alcohol use Do not drink alcohol if: Your health care provider tells you not to drink. You are pregnant, may be pregnant, or are planning to become pregnant. If you drink alcohol: Limit how much you have to: 0-1 drink a day for women. 0-2 drinks a day for men. Know how much alcohol is in your drink. In the [place] one drink equals one 12 oz bottle of beer (355 mL), one 5 oz glass of wine (148 mL), or one 1 oz glass of hard liquor (44 mL). Medicines Your health care provider may prescribe medicine if lifestyle changes are not enough to get your blood pressure under control and if: Your systolic blood pressure is 130 or higher. Your diastolic blood pressure is 80 or higher. Take medicines only as told by your health care provider. Follow the directions carefully. Blood pressure medicines must be taken as told by your health care provider. The medicine does not work as well when you skip doses. Skipping doses also puts you at risk for problems. Monitoring Before you monitor your blood pressure: Do not smoke, drink caffeinated beverages, or exercise within 30 minutes before taking a measurement. Use the bathroom and empty your bladder (urinate). Sit quietly for at least 5 minutes before taking measurements. Monitor your blood pressure at home as told by your health care provider. To do this: Sit with your back straight and supported. Place your feet flat on the floor. Do not cross your legs. Support your arm on a flat surface, such as a table. Make sure your upper arm is at heart level. Each time you measure, take two or three readings one minute apart and record the results. You may also need to have your blood pressure checked regularly by  your health care provider. General information Talk with your health care provider about your diet, exercise habits, and other lifestyle factors that may be contributing to hypertension. Review all the medicines you take with your health care provider because there may be side effects or interactions. Keep all follow-up visits. Your health care provider can help you create and adjust your plan for managing your high blood pressure. Where to find more information National Heart, Lung, and Blood Institute: PopSte[employer].is American Heart Association: www.heart.org Contact a health care provider if: You think you are having a reaction to medicines you have taken. You have repeated (recurrent) headaches. You feel dizzy. You have swelling in your ankles. You have trouble with your vision. Get help right away if: You develop a severe headache or confusion. You have unusual weakness or numbness, or you feel faint. You have severe pain in your chest or abdomen. You vomit repeatedly. You have trouble breathing. These symptoms may be an emergency. Get help right away. Call 911. Do not wait to see if the symptoms will go away. Do not drive yourself to the hospital. Summary Hypertension is when the force of blood pumping through your arteries is too strong. If this condition is not controlled, it may put you at risk for serious complications. Your personal target blood pressure may vary depending on your medical conditions,  your age, and other factors. For most people, a normal blood pressure is less than 120/80. Hypertension is managed by lifestyle changes, medicines, or both. Lifestyle changes to help manage hypertension include losing weight, eating a healthy, low-sodium diet, exercising more, stopping smoking, and limiting alcohol. This information is not intended to replace advice given to you by your health care provider. Make sure you discuss any questions you have with your health care  provider. Document Revised: 12/05/2020 Document Reviewed: 12/05/2020 Elsevier Patient Education  2024 [employer].

## 2023-10-19 ENCOUNTER — Ambulatory Visit: Payer: Self-pay | Admitting: Family Medicine

## 2023-10-19 DIAGNOSIS — R911 Solitary pulmonary nodule: Secondary | ICD-10-CM

## 2023-10-19 LAB — CMP14+EGFR
ALT: 14 IU/L (ref 0–32)
AST: 17 IU/L (ref 0–40)
Albumin: 4.3 g/dL (ref 3.9–4.9)
Alkaline Phosphatase: 83 IU/L (ref 44–121)
BUN/Creatinine Ratio: 14 (ref 12–28)
BUN: 10 mg/dL (ref 8–27)
Bilirubin Total: 0.3 mg/dL (ref 0.0–1.2)
CO2: 20 mmol/L (ref 20–29)
Calcium: 10 mg/dL (ref 8.7–10.3)
Chloride: 105 mmol/L (ref 96–106)
Creatinine, Ser: 0.69 mg/dL (ref 0.57–1.00)
Globulin, Total: 2.7 g/dL (ref 1.5–4.5)
Glucose: 96 mg/dL (ref 70–99)
Potassium: 4.5 mmol/L (ref 3.5–5.2)
Sodium: 142 mmol/L (ref 134–144)
Total Protein: 7 g/dL (ref 6.0–8.5)
eGFR: 96 mL/min/1.73 (ref >59)

## 2023-10-19 LAB — LP+NON-HDL CHOLESTEROL
Cholesterol, Total: 218 mg/dL — ABNORMAL HIGH (ref 100–199)
HDL: 54 mg/dL (ref >39)
LDL Chol Calc (NIH): 148 mg/dL — ABNORMAL HIGH (ref 0–99)
Total Non-HDL-Chol (LDL+VLDL): 164 mg/dL — ABNORMAL HIGH (ref 0–129)
Triglycerides: 92 mg/dL (ref 0–149)
VLDL Cholesterol Cal: 16 mg/dL (ref 5–40)

## 2023-10-20 NOTE — Telephone Encounter (Signed)
 Pt was called and is aware of results, DOB was confirmed.  ?

## 2023-10-20 NOTE — Telephone Encounter (Signed)
 Patient returning call. Call transferred to [facility].

## 2023-10-20 NOTE — Telephone Encounter (Signed)
-----   Message from [place] sent at 10/19/2023  5:19 PM EDT ----- Inform her that cholesterol is still elevated the same like it was at her last visit.  Can you please check with her to ensure that she has been taking the cholesterol pill consistently?  Kidney and  liver functions are normal.  Thank you. ----- Message ----- From: Lonita Florette GAILS, CMA Sent: 10/18/2023   9:16 AM EDT To: Corrina Sabin, MD

## 2023-11-19 ENCOUNTER — Other Ambulatory Visit: Payer: Self-pay | Admitting: Family Medicine

## 2023-11-19 DIAGNOSIS — R634 Abnormal weight loss: Secondary | ICD-10-CM

## 2023-12-10 ENCOUNTER — Ambulatory Visit
Admission: RE | Admit: 2023-12-10 | Discharge: 2023-12-10 | Disposition: A | Discharge status: Home / Self Care | Source: Ambulatory Visit | Attending: Family Medicine | Admitting: Family Medicine

## 2023-12-10 DIAGNOSIS — R918 Other nonspecific abnormal finding of lung field: Secondary | ICD-10-CM

## 2023-12-24 ENCOUNTER — Telehealth: Payer: Self-pay

## 2023-12-24 NOTE — Telephone Encounter (Signed)
 Call from [facility] radiology spoke with Channing who reported that patient' s CT CHEST is completed and patient's  IMPRESSION: Lung-RADS 4B, suspicious. Additional imaging evaluation or consultation with Pulmonology or Thoracic Surgery recommended. New 10.4 mm nodular opacity in the anterior right lower lobe may be atelectatic.

## 2023-12-24 NOTE — Telephone Encounter (Signed)
 Addressed.

## 2023-12-24 NOTE — Addendum Note (Signed)
 Addended by: Klyn Kroening on: 12/24/2023 11:29 AM   Modules accepted: Orders

## 2023-12-27 ENCOUNTER — Telehealth: Payer: Self-pay | Admitting: Family Medicine

## 2023-12-27 NOTE — Telephone Encounter (Signed)
 Called patient and informed her that a pulmonology referral has been placed. Patient stated she already has an appointment scheduled but had additional questions regarding the reason for the referral. Please advise.

## 2023-12-27 NOTE — Telephone Encounter (Signed)
 Copied from CRM [id number]. Topic: General - Call Back - No Documentation >> Dec 27, 2023  8:53 AM Avram MATSU wrote:  Reason for CRM: patient stated she missed a call from the office, she wants to talk about her referral/appt. Please advise [phone]

## 2023-12-27 NOTE — Telephone Encounter (Signed)
 Pt has returned call.

## 2023-12-29 NOTE — Telephone Encounter (Signed)
 I have spoken to her about the results of her CT scan.

## 2024-01-03 ENCOUNTER — Ambulatory Visit (INDEPENDENT_AMBULATORY_CARE_PROVIDER_SITE_OTHER)

## 2024-01-03 ENCOUNTER — Telehealth: Payer: Self-pay

## 2024-01-03 VITALS — BP 119/86 | HR 87 | Temp 98.0°F | Ht 69.0 in | Wt 122.6 lb

## 2024-01-03 DIAGNOSIS — Z87891 Personal history of nicotine dependence: Secondary | ICD-10-CM

## 2024-01-03 DIAGNOSIS — R911 Solitary pulmonary nodule: Secondary | ICD-10-CM | POA: Insufficient documentation

## 2024-01-03 DIAGNOSIS — J439 Emphysema, unspecified: Secondary | ICD-10-CM

## 2024-01-03 DIAGNOSIS — Z720 Tobacco use: Secondary | ICD-10-CM

## 2024-01-03 DIAGNOSIS — R918 Other nonspecific abnormal finding of lung field: Secondary | ICD-10-CM

## 2024-01-03 NOTE — H&P (View-Only) (Signed)
 New Patient Pulmonology Office Visit   Subjective:  Patient ID: Tasha Nguyen, female    DOB: April 09, 1955  MRN: [id number]  Referred by: Delbert Clam, MD  CC:  Chief Complaint  Patient presents with   Consult    Nodule    HPI Tasha Nguyen is a 68 y.o. female with who is referred to this clinic for abnormal CT chest. She is a former smoker with over 20-pack-year smoking history.  Underwent a CT chest in March/2025 which showed suspicious semisolid right upper lobe lung nodules along with few other nodular/atelectatic areas at bilateral bases.  Underwent a follow-up CT chest in September/2025 which shows persistence of the right upper lobe lung nodule (may be slightly more dense).  This scan also showed a new nodule on right lower lobe-dense/1.2 cm in size. The bilateral lower nodular/atelectatic areas are more prominent on recent scans.  Hence she was referred to pulmonary clinic  Her pulmonary function test from March/2025 has shown obstruction with significant bronchodilator response.  It also revealed air trapping.  She has been on Incruse since then and reports positive response.  She works 2 full-time job.  Works as a Investment banker, operational She lives by herself. Her 2 sisters were present in today's clinic visit Patient drinks 5 beer daily. She quit smoking but apparently smokes weed daily  Review of system positive for shortness of breath     ROS Review of symptoms negative except mentioned above  Allergies: Hydrocodone-acetaminophen , Oxycodone, and Oxycodone-acetaminophen   Current Outpatient Medications:    albuterol  (VENTOLIN  HFA) 108 (90 Base) MCG/ACT inhaler, Inhale 2 puffs into the lungs every 6 (six) hours as needed for wheezing or shortness of breath., Disp: 8 g, Rfl: 2   amLODipine  (NORVASC ) 5 MG tablet, Take 1 tablet (5 mg total) by mouth daily., Disp: 90 tablet, Rfl: 1   atorvastatin  (LIPITOR) 40 MG tablet, Take 1 tablet (40 mg total) by mouth daily., Disp: 90 tablet, Rfl:  1   cetirizine  (ZYRTEC ) 10 MG tablet, Take 1 tablet (10 mg total) by mouth daily., Disp: 30 tablet, Rfl: 1   diclofenac  Sodium (VOLTAREN ) 1 % GEL, Apply 2 g topically 4 (four) times daily., Disp: 50 g, Rfl: 0   fluticasone  (FLONASE ) 50 MCG/ACT nasal spray, Place 2 sprays into both nostrils daily., Disp: 16 g, Rfl: 6   mirtazapine  (REMERON  SOL-TAB) 15 MG disintegrating tablet, TAKE 1 TABLET BY MOUTH EVERYDAY AT BEDTIME, Disp: 90 tablet, Rfl: 1   mirtazapine  (REMERON  SOL-TAB) 30 MG disintegrating tablet, Take 1 tablet (30 mg total) by mouth at bedtime., Disp: 90 tablet, Rfl: 1   pantoprazole  (PROTONIX ) 40 MG tablet, Take 1 tablet (40 mg total) by mouth daily., Disp: 60 tablet, Rfl: 3   umeclidinium bromide  (INCRUSE ELLIPTA ) 62.5 MCG/ACT AEPB, Inhale 1 puff into the lungs daily., Disp: 30 each, Rfl: 6 Past Medical History:  Diagnosis Date   Colon polyps    Past Surgical History:  Procedure Laterality Date   none     Family History  Problem Relation Age of Onset   Diabetes Mother    Ovarian cancer Mother 23   CAD Father    CAD Sister    Diabetes Sister    Diabetes Sister    Breast cancer Cousin 22   Colon cancer Neg Hx    Esophageal cancer Neg Hx    Stomach cancer Neg Hx    Pancreatic cancer Neg Hx    Liver disease Neg Hx    Rectal cancer Neg  Hx    BRCA 1/2 Neg Hx    Social History   Socioeconomic History   Marital status: Single    Spouse name: Not on file   Number of children: Not on file   Years of education: Not on file   Highest education level: Not on file  Occupational History   Not on file  Tobacco Use   Smoking status: Former    Current packs/day: 0.00    Average packs/day: 1 pack/day for 26.1 years (26.1 ttl pk-yrs)    Types: Cigarettes    Start date: 04/18/1987    Quit date: 05/12/2013    Years since quitting: 10.6   Smokeless tobacco: Never  Vaping Use   Vaping status: Never Used  Substance and Sexual Activity   Alcohol use: Yes    Comment: weekends-4-5  beers   Drug use: Yes    Types: Marijuana    Comment: occ   Sexual activity: Yes    Birth control/protection: None  Other Topics Concern   Not on file  Social History Narrative   Not on file   Social Drivers of Health   Financial Resource Strain: Not on file  Food Insecurity: Not on file  Transportation Needs: Not on file  Physical Activity: Not on file  Stress: Not on file  Social Connections: Not on file  Intimate Partner Violence: Not on file         Objective:  BP 119/86   Pulse 87   Temp 98 F (36.7 C) (Oral)   Ht 5' 9 (1.753 m)   Wt 122 lb 9.6 oz (55.6 kg)   SpO2 97%   BMI 18.10 kg/m    Physical Exam Constitutional:      General: She is not in acute distress.    Appearance: Normal appearance.  HENT:     Mouth/Throat:     Mouth: Mucous membranes are moist.  Cardiovascular:     Rate and Rhythm: Normal rate.  Pulmonary:     Effort: No respiratory distress.     Breath sounds: No wheezing or rales.  Musculoskeletal:     Right lower leg: No edema.     Left lower leg: No edema.  Skin:    General: Skin is warm.  Neurological:     Mental Status: She is alert and oriented to person, place, and time.  Psychiatric:        Mood and Affect: Mood normal.     Diagnostic Review:    Pft    Latest Ref Rng & Units 06/15/2023   11:53 AM  PFT Results  FVC-Pre L 2.97   FVC-Predicted Pre % 78   FVC-Post L 3.20   FVC-Predicted Post % 84   Pre FEV1/FVC % % 53   Post FEV1/FCV % % 55   FEV1-Pre L 1.58   FEV1-Predicted Pre % 54   FEV1-Post L 1.77   DLCO uncorrected ml/min/mmHg 14.74   DLCO UNC% % 63   DLVA Predicted % 74   TLC L 6.75   TLC % Predicted % 116   RV % Predicted % 151    FEV1 12% change      CT chest in March/2025 shows suspicious semisolid right upper lobe lung nodules along with few other nodular/atelectatic areas at bilateral bases.    follow-up CT chest in September/2025 showed persistence of the right upper lobe lung nodule (may be  slightly more dense).  This scan also showed a new nodule on right lower lobe-  dense/1.2 cm in size. The bilateral lower nodular/atelectatic areas are more prominent on recent scans.      Assessment & Plan:   Assessment & Plan Nodule of right lung Discussed prior and current CT chest at length with the patient and her family in clinic today Discussed potential differentials-lung cancer, inflammatory nodules, atelectasis The right upper lobe semisolid nodule has persisted over 6 months.  And looks suspicious.  Will need biopsy The new right lower lobe nodule was not present on prior CT chest and could be inflammatory.  Will schedule a short interval PET/CT-next available-to follow-up on this nodule.  If still present or PET avid, may need to proceed with biopsy of this nodule also The more peripheral basal nodules, present on prior CT chest but more conspicuous on current CT. Because of location, these are likely atelectatic areas on chronic scarring.  These nodules are also at difficult location for biopsy- proximity to pleura and diaphragm. We will follow-up on PET/CT I explained the bronchoscopic biopsy procedure at depth.  We discussed about other potential options including continued follow up, CT guided biopsy, surgical biopsy. Risks and benefits of bronchoscopy +/- biopsy discussed with the patient and his family in details and all the questions were answered. They understand the risk of bleeding, pneumothorax, injury to blood vessels and would like to proceed for the bronchoscopy with biopsy. Orders:   NM PET Image Initial (PI) Skull Base To Thigh; Future  Tobacco abuse Congratulated on quitting smoking Orders:   NM PET Image Initial (PI) Skull Base To Thigh; Future  Lung nodules  Orders:   NM PET Image Initial (PI) Skull Base To Thigh; Future  Pulmonary emphysema, unspecified emphysema type PFT is concerning for COPD-asthma overlap, however doesn't have asthma like symptoms Her  symptoms have improved on incruse Continue the same for now. May switch to ICS containing inhaler in the future if she develops asthma like symptoms     I personally spent a total of 60 minutes in the care of the patient today including performing a medically appropriate exam/evaluation, counseling and educating, placing orders, referring and communicating with other health care professionals, documenting clinical information in the EHR, and independently interpreting results.  Thank you for the opportunity to take part in the care of LILYBETH VIEN   Return in about 3 weeks (around 01/24/2024).   Evan Mackie Pleas, MD [facility] Office: [phone]

## 2024-01-03 NOTE — Telephone Encounter (Signed)
 Please schedule the following:  Provider performing procedure:Dr Byrum Diagnosis: lung nodules Which side if for nodule / mass? right Procedure: robotic/ion bronchoscopy, EBUS Has patient been spoken to by Provider and given informed consent? Yes, has given verbal consent Anesthesia: general Do you need Fluro? yes Duration of procedure: 2 hours Date: to be determined (please schedule PET) Alternate Date: any (preferred on Oct 7, if PET can be performed before that) Time: AM Location: [facility]  Does patient have OSA? no DM? no Or Latex allergy? no Medication Restriction/ Anticoagulate/Antiplatelet: none Pre-op Labs Ordered:determined by Anesthesia Imaging request: PET prior to bronchoscopy day (If, SuperDimension CT Chest, please have STAT courier sent to ENDO)  Pt needs biopsy of right upper lobe nodule, she has a new right lower lobe and few other chronic basal nodules. If the right lower lobe nodule is present on PET or looks suspicious- will need biopsy of that nodule as well Will need BAL as well - for lower lobe nodular densities

## 2024-01-03 NOTE — Telephone Encounter (Signed)
 Patinet scheduled 01/17/24 at [facility]. Routing to [employer] for [employer].

## 2024-01-03 NOTE — Assessment & Plan Note (Signed)
 PFT is concerning for COPD-asthma overlap, however doesn't have asthma like symptoms Her symptoms have improved on incruse Continue the same for now. May switch to ICS containing inhaler in the future if she develops asthma like symptoms

## 2024-01-03 NOTE — Progress Notes (Signed)
 New Patient Pulmonology Office Visit   Subjective:  Patient ID: Tasha Nguyen, female    DOB: April 09, 1955  MRN: [id number]  Referred by: Delbert Clam, MD  CC:  Chief Complaint  Patient presents with   Consult    Nodule    HPI Tasha Nguyen is a 68 y.o. female with who is referred to this clinic for abnormal CT chest. She is a former smoker with over 20-pack-year smoking history.  Underwent a CT chest in March/2025 which showed suspicious semisolid right upper lobe lung nodules along with few other nodular/atelectatic areas at bilateral bases.  Underwent a follow-up CT chest in September/2025 which shows persistence of the right upper lobe lung nodule (may be slightly more dense).  This scan also showed a new nodule on right lower lobe-dense/1.2 cm in size. The bilateral lower nodular/atelectatic areas are more prominent on recent scans.  Hence she was referred to pulmonary clinic  Her pulmonary function test from March/2025 has shown obstruction with significant bronchodilator response.  It also revealed air trapping.  She has been on Incruse since then and reports positive response.  She works 2 full-time job.  Works as a Investment banker, operational She lives by herself. Her 2 sisters were present in today's clinic visit Patient drinks 5 beer daily. She quit smoking but apparently smokes weed daily  Review of system positive for shortness of breath     ROS Review of symptoms negative except mentioned above  Allergies: Hydrocodone-acetaminophen , Oxycodone, and Oxycodone-acetaminophen   Current Outpatient Medications:    albuterol  (VENTOLIN  HFA) 108 (90 Base) MCG/ACT inhaler, Inhale 2 puffs into the lungs every 6 (six) hours as needed for wheezing or shortness of breath., Disp: 8 g, Rfl: 2   amLODipine  (NORVASC ) 5 MG tablet, Take 1 tablet (5 mg total) by mouth daily., Disp: 90 tablet, Rfl: 1   atorvastatin  (LIPITOR) 40 MG tablet, Take 1 tablet (40 mg total) by mouth daily., Disp: 90 tablet, Rfl:  1   cetirizine  (ZYRTEC ) 10 MG tablet, Take 1 tablet (10 mg total) by mouth daily., Disp: 30 tablet, Rfl: 1   diclofenac  Sodium (VOLTAREN ) 1 % GEL, Apply 2 g topically 4 (four) times daily., Disp: 50 g, Rfl: 0   fluticasone  (FLONASE ) 50 MCG/ACT nasal spray, Place 2 sprays into both nostrils daily., Disp: 16 g, Rfl: 6   mirtazapine  (REMERON  SOL-TAB) 15 MG disintegrating tablet, TAKE 1 TABLET BY MOUTH EVERYDAY AT BEDTIME, Disp: 90 tablet, Rfl: 1   mirtazapine  (REMERON  SOL-TAB) 30 MG disintegrating tablet, Take 1 tablet (30 mg total) by mouth at bedtime., Disp: 90 tablet, Rfl: 1   pantoprazole  (PROTONIX ) 40 MG tablet, Take 1 tablet (40 mg total) by mouth daily., Disp: 60 tablet, Rfl: 3   umeclidinium bromide  (INCRUSE ELLIPTA ) 62.5 MCG/ACT AEPB, Inhale 1 puff into the lungs daily., Disp: 30 each, Rfl: 6 Past Medical History:  Diagnosis Date   Colon polyps    Past Surgical History:  Procedure Laterality Date   none     Family History  Problem Relation Age of Onset   Diabetes Mother    Ovarian cancer Mother 23   CAD Father    CAD Sister    Diabetes Sister    Diabetes Sister    Breast cancer Cousin 22   Colon cancer Neg Hx    Esophageal cancer Neg Hx    Stomach cancer Neg Hx    Pancreatic cancer Neg Hx    Liver disease Neg Hx    Rectal cancer Neg  Hx    BRCA 1/2 Neg Hx    Social History   Socioeconomic History   Marital status: Single    Spouse name: Not on file   Number of children: Not on file   Years of education: Not on file   Highest education level: Not on file  Occupational History   Not on file  Tobacco Use   Smoking status: Former    Current packs/day: 0.00    Average packs/day: 1 pack/day for 26.1 years (26.1 ttl pk-yrs)    Types: Cigarettes    Start date: 04/18/1987    Quit date: 05/12/2013    Years since quitting: 10.6   Smokeless tobacco: Never  Vaping Use   Vaping status: Never Used  Substance and Sexual Activity   Alcohol use: Yes    Comment: weekends-4-5  beers   Drug use: Yes    Types: Marijuana    Comment: occ   Sexual activity: Yes    Birth control/protection: None  Other Topics Concern   Not on file  Social History Narrative   Not on file   Social Drivers of Health   Financial Resource Strain: Not on file  Food Insecurity: Not on file  Transportation Needs: Not on file  Physical Activity: Not on file  Stress: Not on file  Social Connections: Not on file  Intimate Partner Violence: Not on file         Objective:  BP 119/86   Pulse 87   Temp 98 F (36.7 C) (Oral)   Ht 5' 9 (1.753 m)   Wt 122 lb 9.6 oz (55.6 kg)   SpO2 97%   BMI 18.10 kg/m    Physical Exam Constitutional:      General: She is not in acute distress.    Appearance: Normal appearance.  HENT:     Mouth/Throat:     Mouth: Mucous membranes are moist.  Cardiovascular:     Rate and Rhythm: Normal rate.  Pulmonary:     Effort: No respiratory distress.     Breath sounds: No wheezing or rales.  Musculoskeletal:     Right lower leg: No edema.     Left lower leg: No edema.  Skin:    General: Skin is warm.  Neurological:     Mental Status: She is alert and oriented to person, place, and time.  Psychiatric:        Mood and Affect: Mood normal.     Diagnostic Review:    Pft    Latest Ref Rng & Units 06/15/2023   11:53 AM  PFT Results  FVC-Pre L 2.97   FVC-Predicted Pre % 78   FVC-Post L 3.20   FVC-Predicted Post % 84   Pre FEV1/FVC % % 53   Post FEV1/FCV % % 55   FEV1-Pre L 1.58   FEV1-Predicted Pre % 54   FEV1-Post L 1.77   DLCO uncorrected ml/min/mmHg 14.74   DLCO UNC% % 63   DLVA Predicted % 74   TLC L 6.75   TLC % Predicted % 116   RV % Predicted % 151    FEV1 12% change      CT chest in March/2025 shows suspicious semisolid right upper lobe lung nodules along with few other nodular/atelectatic areas at bilateral bases.    follow-up CT chest in September/2025 showed persistence of the right upper lobe lung nodule (may be  slightly more dense).  This scan also showed a new nodule on right lower lobe-  dense/1.2 cm in size. The bilateral lower nodular/atelectatic areas are more prominent on recent scans.      Assessment & Plan:   Assessment & Plan Nodule of right lung Discussed prior and current CT chest at length with the patient and her family in clinic today Discussed potential differentials-lung cancer, inflammatory nodules, atelectasis The right upper lobe semisolid nodule has persisted over 6 months.  And looks suspicious.  Will need biopsy The new right lower lobe nodule was not present on prior CT chest and could be inflammatory.  Will schedule a short interval PET/CT-next available-to follow-up on this nodule.  If still present or PET avid, may need to proceed with biopsy of this nodule also The more peripheral basal nodules, present on prior CT chest but more conspicuous on current CT. Because of location, these are likely atelectatic areas on chronic scarring.  These nodules are also at difficult location for biopsy- proximity to pleura and diaphragm. We will follow-up on PET/CT I explained the bronchoscopic biopsy procedure at depth.  We discussed about other potential options including continued follow up, CT guided biopsy, surgical biopsy. Risks and benefits of bronchoscopy +/- biopsy discussed with the patient and his family in details and all the questions were answered. They understand the risk of bleeding, pneumothorax, injury to blood vessels and would like to proceed for the bronchoscopy with biopsy. Orders:   NM PET Image Initial (PI) Skull Base To Thigh; Future  Tobacco abuse Congratulated on quitting smoking Orders:   NM PET Image Initial (PI) Skull Base To Thigh; Future  Lung nodules  Orders:   NM PET Image Initial (PI) Skull Base To Thigh; Future  Pulmonary emphysema, unspecified emphysema type PFT is concerning for COPD-asthma overlap, however doesn't have asthma like symptoms Her  symptoms have improved on incruse Continue the same for now. May switch to ICS containing inhaler in the future if she develops asthma like symptoms     I personally spent a total of 60 minutes in the care of the patient today including performing a medically appropriate exam/evaluation, counseling and educating, placing orders, referring and communicating with other health care professionals, documenting clinical information in the EHR, and independently interpreting results.  Thank you for the opportunity to take part in the care of Tasha Nguyen   Return in about 3 weeks (around 01/24/2024).   Evan Mackie Pleas, MD [facility] Office: [phone]

## 2024-01-03 NOTE — Patient Instructions (Addendum)
 It was a pleasure to see you today. Your PET/CT and bronchoscopy will be scheduled. We will see you in this clinic after the biopsy to review/discuss biopsy results Please call us  if you have any questions.

## 2024-01-06 ENCOUNTER — Ambulatory Visit (HOSPITAL_COMMUNITY)
Admission: RE | Admit: 2024-01-06 | Discharge: 2024-01-06 | Disposition: A | Discharge status: Home / Self Care | Source: Ambulatory Visit

## 2024-01-06 DIAGNOSIS — Z720 Tobacco use: Secondary | ICD-10-CM | POA: Insufficient documentation

## 2024-01-06 DIAGNOSIS — R918 Other nonspecific abnormal finding of lung field: Secondary | ICD-10-CM | POA: Insufficient documentation

## 2024-01-06 DIAGNOSIS — R911 Solitary pulmonary nodule: Secondary | ICD-10-CM | POA: Diagnosis present

## 2024-01-06 LAB — GLUCOSE, CAPILLARY: Glucose-Capillary: 83 mg/dL (ref 70–99)

## 2024-01-06 MED ORDER — FLUDEOXYGLUCOSE F - 18 (FDG) INJECTION
6.5730 | Freq: Once | INTRAVENOUS | Status: AC
  Administered 2024-01-06: 6.573 via INTRAVENOUS

## 2024-01-07 ENCOUNTER — Telehealth: Payer: Self-pay

## 2024-01-07 NOTE — Telephone Encounter (Signed)
 Copied from CRM #[id number]. Topic: Clinical - Lab/Test Results >> Jan 07, 2024  8:40 AM Corean SAUNDERS wrote: Reason for CRM: Dianne from [facility] calling to let Dr. Pleas know that patients PET scan results from 10/2 are available in patients chart.  Patient scheduled for bronchoscopy.NFN

## 2024-01-10 ENCOUNTER — Ambulatory Visit: Payer: Self-pay

## 2024-01-11 ENCOUNTER — Telehealth: Payer: Self-pay | Admitting: Family Medicine

## 2024-01-11 NOTE — Telephone Encounter (Signed)
 2nd attempt contacted pt lvm to resch appt.  Pcp not in the office (sent out text message as well )

## 2024-01-12 NOTE — Progress Notes (Signed)
 Called and spoke to pt - advised of PET results per Dr. Pleas. Patient verbalized understanding, NFN.

## 2024-01-12 NOTE — Progress Notes (Signed)
 ATC x1. LVMTCB

## 2024-01-13 ENCOUNTER — Other Ambulatory Visit: Payer: Self-pay

## 2024-01-13 ENCOUNTER — Encounter (HOSPITAL_COMMUNITY): Payer: Self-pay | Admitting: Emergency Medicine

## 2024-01-13 NOTE — Progress Notes (Addendum)
 PCP - Dr Corrina Sabin Cardiologist - none  CT Chest x-ray - 12/10/23 EKG - DOS Stress Test - 02/07/20 ECHO - 06/08/08 Cardiac Cath - n/a  ICD Pacemaker/Loop - n/a  Sleep Study -  n/a  Diabetes - n/a  Aspirin & Blood Thinner Instructions:  n/a  NPO  Anesthesia review: Yes  STOP now taking any Aspirin (unless otherwise instructed by your surgeon), Aleve , Naproxen , Ibuprofen , Motrin , Advil , Goody's, BC's, all herbal medications, fish oil, and all vitamins.   Coronavirus Screening Do you have any of the following symptoms:  Cough yes/no: No Fever (>100.51F)  yes/no: No Runny nose yes/no: No Sore throat yes/no: No Difficulty breathing/shortness of breath  occasional with exertion  Have you traveled in the last 14 days and where? yes/no: No  Patient verbalized understanding of instructions that were given via phone.

## 2024-01-14 NOTE — Telephone Encounter (Signed)
 Copied from CRM [id number]. Topic: General - Other >> Jan 12, 2024 12:07 PM Whitney O wrote: Reason for CRM: patient returning call that she received from cma [employer]. CMA gone to lunch please give patient a call back . Called cal and spoke with ms debbie . Send a message back clinical for a cma or nurse to give patient a call back concerning results look like  See previous note. Results relayed to the pt.

## 2024-01-17 ENCOUNTER — Ambulatory Visit (HOSPITAL_COMMUNITY)

## 2024-01-17 ENCOUNTER — Encounter (HOSPITAL_COMMUNITY): Payer: Self-pay | Admitting: Emergency Medicine

## 2024-01-17 ENCOUNTER — Other Ambulatory Visit: Payer: Self-pay

## 2024-01-17 ENCOUNTER — Ambulatory Visit (HOSPITAL_COMMUNITY): Admitting: Anesthesiology

## 2024-01-17 ENCOUNTER — Ambulatory Visit (HOSPITAL_COMMUNITY)
Admission: RE | Admit: 2024-01-17 | Discharge: 2024-01-17 | Disposition: A | Discharge status: Home / Self Care | Attending: Emergency Medicine | Admitting: Emergency Medicine

## 2024-01-17 ENCOUNTER — Encounter (HOSPITAL_COMMUNITY)
Admission: RE | Disposition: A | Discharge status: Home / Self Care | Payer: Self-pay | Source: Home / Self Care | Attending: Emergency Medicine

## 2024-01-17 ENCOUNTER — Other Ambulatory Visit: Payer: Self-pay | Admitting: Critical Care Medicine

## 2024-01-17 DIAGNOSIS — J449 Chronic obstructive pulmonary disease, unspecified: Secondary | ICD-10-CM | POA: Diagnosis not present

## 2024-01-17 DIAGNOSIS — C3411 Malignant neoplasm of upper lobe, right bronchus or lung: Secondary | ICD-10-CM | POA: Diagnosis not present

## 2024-01-17 DIAGNOSIS — Z87891 Personal history of nicotine dependence: Secondary | ICD-10-CM | POA: Diagnosis not present

## 2024-01-17 DIAGNOSIS — I10 Essential (primary) hypertension: Secondary | ICD-10-CM | POA: Insufficient documentation

## 2024-01-17 DIAGNOSIS — R911 Solitary pulmonary nodule: Secondary | ICD-10-CM

## 2024-01-17 DIAGNOSIS — K219 Gastro-esophageal reflux disease without esophagitis: Secondary | ICD-10-CM | POA: Diagnosis not present

## 2024-01-17 HISTORY — DX: Gastro-esophageal reflux disease without esophagitis: K21.9

## 2024-01-17 HISTORY — PX: VIDEO BRONCHOSCOPY WITH ENDOBRONCHIAL NAVIGATION: SHX6175

## 2024-01-17 HISTORY — DX: Other seasonal allergic rhinitis: J30.2

## 2024-01-17 HISTORY — DX: Dyspnea, unspecified: R06.00

## 2024-01-17 HISTORY — DX: Personal history of urinary calculi: Z87.442

## 2024-01-17 HISTORY — DX: Hyperlipidemia, unspecified: E78.5

## 2024-01-17 HISTORY — DX: Pneumonia, unspecified organism: J18.9

## 2024-01-17 HISTORY — DX: Solitary pulmonary nodule: R91.1

## 2024-01-17 LAB — COMPREHENSIVE METABOLIC PANEL WITH GFR
ALT: 19 U/L (ref 0–44)
AST: 24 U/L (ref 15–41)
Albumin: 3.5 g/dL (ref 3.5–5.0)
Alkaline Phosphatase: 74 U/L (ref 38–126)
Anion gap: 11 (ref 5–15)
BUN: 5 mg/dL — ABNORMAL LOW (ref 8–23)
CO2: 24 mmol/L (ref 22–32)
Calcium: 9.4 mg/dL (ref 8.9–10.3)
Chloride: 107 mmol/L (ref 98–111)
Creatinine, Ser: 0.64 mg/dL (ref 0.44–1.00)
GFR, Estimated: >60 mL/min (ref >60)
Glucose, Bld: 100 mg/dL — ABNORMAL HIGH (ref 70–99)
Potassium: 3.9 mmol/L (ref 3.5–5.1)
Sodium: 142 mmol/L (ref 135–145)
Total Bilirubin: 0.6 mg/dL (ref 0.0–1.2)
Total Protein: 7.1 g/dL (ref 6.5–8.1)

## 2024-01-17 LAB — CBC
HCT: 40.5 % (ref 36.0–46.0)
Hemoglobin: 13.2 g/dL (ref 12.0–15.0)
MCH: 31.7 pg (ref 26.0–34.0)
MCHC: 32.6 g/dL (ref 30.0–36.0)
MCV: 97.4 fL (ref 80.0–100.0)
Platelets: 379 K/uL (ref 150–400)
RBC: 4.16 MIL/uL (ref 3.87–5.11)
RDW: 14 % (ref 11.5–15.5)
WBC: 7.5 K/uL (ref 4.0–10.5)
nRBC: 0 % (ref 0.0–0.2)

## 2024-01-17 SURGERY — VIDEO BRONCHOSCOPY WITH ENDOBRONCHIAL NAVIGATION
Anesthesia: General | Laterality: Right

## 2024-01-17 MED ORDER — PROPOFOL 500 MG/50ML IV EMUL
INTRAVENOUS | Status: DC | PRN
Start: 2024-01-17 — End: 2024-01-17
  Administered 2024-01-17: 150 ug/kg/min via INTRAVENOUS

## 2024-01-17 MED ORDER — CHLORHEXIDINE GLUCONATE 0.12 % MT SOLN
OROMUCOSAL | Status: AC
  Administered 2024-01-17: 15 mL via OROMUCOSAL
  Filled 2024-01-17: qty 15

## 2024-01-17 MED ORDER — DEXAMETHASONE SOD PHOSPHATE PF 10 MG/ML IJ SOLN
INTRAMUSCULAR | Status: DC | PRN
  Administered 2024-01-17: 10 mg via INTRAVENOUS

## 2024-01-17 MED ORDER — LACTATED RINGERS IV SOLN
INTRAVENOUS | Status: DC
Start: 2024-01-17 — End: 2024-01-17

## 2024-01-17 MED ORDER — ROCURONIUM BROMIDE 10 MG/ML (PF) SYRINGE
PREFILLED_SYRINGE | INTRAVENOUS | Status: DC | PRN
  Administered 2024-01-17: 60 mg via INTRAVENOUS

## 2024-01-17 MED ORDER — ACETAMINOPHEN 10 MG/ML IV SOLN: 1000.0000 mg | Freq: Once | INTRAVENOUS | Status: DC | PRN

## 2024-01-17 MED ORDER — PHENYLEPHRINE HCL-NACL 20-0.9 MG/250ML-% IV SOLN
INTRAVENOUS | Status: DC | PRN
  Administered 2024-01-17: 25 ug/min via INTRAVENOUS

## 2024-01-17 MED ORDER — FENTANYL CITRATE (PF) 100 MCG/2ML IJ SOLN
INTRAMUSCULAR | Status: DC | PRN
  Administered 2024-01-17 (×2): 50 ug via INTRAVENOUS

## 2024-01-17 MED ORDER — EPHEDRINE SULFATE-NACL 50-0.9 MG/10ML-% IV SOSY
PREFILLED_SYRINGE | INTRAVENOUS | Status: DC | PRN
  Administered 2024-01-17 (×2): 5 mg via INTRAVENOUS

## 2024-01-17 MED ORDER — PHENYLEPHRINE 80 MCG/ML (10ML) SYRINGE FOR IV PUSH (FOR BLOOD PRESSURE SUPPORT)
PREFILLED_SYRINGE | INTRAVENOUS | Status: DC | PRN
  Administered 2024-01-17 (×3): 160 ug via INTRAVENOUS

## 2024-01-17 MED ORDER — LIDOCAINE 2% (20 MG/ML) 5 ML SYRINGE
INTRAMUSCULAR | Status: DC | PRN
  Administered 2024-01-17: 50 mg via INTRAVENOUS

## 2024-01-17 MED ORDER — MIDAZOLAM HCL 2 MG/2ML IJ SOLN
INTRAMUSCULAR | Status: DC | PRN
  Administered 2024-01-17 (×2): 1 mg via INTRAVENOUS

## 2024-01-17 MED ORDER — FENTANYL CITRATE (PF) 100 MCG/2ML IJ SOLN: 25.0000 ug | INTRAMUSCULAR | Status: DC | PRN

## 2024-01-17 MED ORDER — SUGAMMADEX SODIUM 200 MG/2ML IV SOLN
INTRAVENOUS | Status: DC | PRN
  Administered 2024-01-17: 100 mg via INTRAVENOUS
  Administered 2024-01-17: 200 mg via INTRAVENOUS

## 2024-01-17 MED ORDER — PROPOFOL 10 MG/ML IV BOLUS
INTRAVENOUS | Status: DC | PRN
  Administered 2024-01-17: 110 mg via INTRAVENOUS

## 2024-01-17 MED ORDER — ONDANSETRON HCL 4 MG/2ML IJ SOLN
INTRAMUSCULAR | Status: DC | PRN
  Administered 2024-01-17: 4 mg via INTRAVENOUS

## 2024-01-17 MED ORDER — FENTANYL CITRATE (PF) 250 MCG/5ML IJ SOLN: INTRAMUSCULAR | Status: DC | PRN

## 2024-01-17 MED ORDER — CHLORHEXIDINE GLUCONATE 0.12 % MT SOLN
15.0000 mL | Freq: Once | OROMUCOSAL | Status: AC
Start: 2024-01-17 — End: 2024-01-17

## 2024-01-17 SURGICAL SUPPLY — 39 items
ADAPTER BRONCHOSCOPE OLYMPUS (ADAPTER) ×1 IMPLANT
ADAPTER VALVE BIOPSY EBUS (MISCELLANEOUS) IMPLANT
BAG COUNTER SPONGE SURGICOUNT (BAG) ×1 IMPLANT
BRUSH CYTOL CELLEBRITY 1.5X140 (MISCELLANEOUS) ×1 IMPLANT
BRUSH SUPERTRAX BIOPSY (INSTRUMENTS) IMPLANT
BRUSH SUPERTRAX NDL-TIP CYTO (INSTRUMENTS) ×1 IMPLANT
CANISTER SUCTION 3000ML PPV (SUCTIONS) ×1 IMPLANT
CNTNR URN SCR LID CUP LEK RST (MISCELLANEOUS) ×1 IMPLANT
COVER BACK TABLE 60X90IN (DRAPES) ×1 IMPLANT
FILTER STRAW FLUID ASPIR (MISCELLANEOUS) IMPLANT
FORCEPS BIOP 1.5 SINGLE USE (MISCELLANEOUS) ×1 IMPLANT
FORCEPS BIOP SUPERTRX PREMAR (INSTRUMENTS) ×1 IMPLANT
GAUZE SPONGE 4X4 12PLY STRL (GAUZE/BANDAGES/DRESSINGS) ×1 IMPLANT
GLOVE BIO SURGEON STRL SZ7.5 (GLOVE) ×2 IMPLANT
GOWN STRL REUS W/ TWL LRG LVL3 (GOWN DISPOSABLE) ×2 IMPLANT
KIT CLEAN ENDO COMPLIANCE (KITS) ×1 IMPLANT
KIT LOCATABLE GUIDE (CANNULA) IMPLANT
KIT MARKER FIDUCIAL DELIVERY (KITS) IMPLANT
KIT TURNOVER KIT B (KITS) ×1 IMPLANT
MARKER SKIN DUAL TIP RULER LAB (MISCELLANEOUS) ×1 IMPLANT
NDL SUPERTRX PREMARK BIOPSY (NEEDLE) ×1 IMPLANT
NEEDLE SUPERTRX PREMARK BIOPSY (NEEDLE) ×1 IMPLANT
OIL SILICONE PENTAX (PARTS (SERVICE/REPAIRS)) ×1 IMPLANT
PAD ARMBOARD POSITIONER FOAM (MISCELLANEOUS) ×2 IMPLANT
PATCHES PATIENT (LABEL) ×3 IMPLANT
SOLN 0.9% NACL 1000 ML (IV SOLUTION) ×1 IMPLANT
SOLN 0.9% NACL POUR BTL 1000ML (IV SOLUTION) ×1 IMPLANT
SOLN STERILE WATER 1000 ML (IV SOLUTION) ×1 IMPLANT
SOLN STERILE WATER BTL 1000 ML (IV SOLUTION) ×1 IMPLANT
SYR 20ML ECCENTRIC (SYRINGE) ×1 IMPLANT
SYR 20ML LL LF (SYRINGE) ×1 IMPLANT
SYR 50ML SLIP (SYRINGE) ×1 IMPLANT
SuperLock Fiducial Marker IMPLANT
TOWEL GREEN STERILE FF (TOWEL DISPOSABLE) ×1 IMPLANT
TRAP SPECIMEN MUCUS 40CC (MISCELLANEOUS) IMPLANT
TUBE CONNECTING 20X1/4 (TUBING) ×1 IMPLANT
UNDERPAD 30X36 HEAVY ABSORB (UNDERPADS AND DIAPERS) ×1 IMPLANT
VALVE BIOPSY SINGLE USE (MISCELLANEOUS) ×1 IMPLANT
VALVE SUCTION BRONCHIO DISP (MISCELLANEOUS) ×1 IMPLANT

## 2024-01-17 NOTE — Anesthesia Postprocedure Evaluation (Signed)
 Anesthesia Post Note  Patient: Tasha Nguyen  Procedure(s) Performed: VIDEO BRONCHOSCOPY WITH ENDOBRONCHIAL NAVIGATION (Right)     Patient location during evaluation: PACU Anesthesia Type: General Level of consciousness: awake and alert Pain management: pain level controlled Vital Signs Assessment: post-procedure vital signs reviewed and stable Respiratory status: spontaneous breathing, nonlabored ventilation, respiratory function stable and patient connected to nasal cannula oxygen Cardiovascular status: blood pressure returned to baseline and stable Postop Assessment: no apparent nausea or vomiting Anesthetic complications: no   No notable events documented.  Last Vitals:  Vitals:   01/17/24 1230 01/17/24 1245  BP: 112/76 116/86  Pulse: 84 84  Resp: 18 (!) 22  Temp:  36.7 C  SpO2: 92% 93%    Last Pain:  Vitals:   01/17/24 1220  TempSrc:   PainSc: 0-No pain                 Munira Polson

## 2024-01-17 NOTE — Interval H&P Note (Signed)
 History and Physical Interval Note:  01/17/2024 9:35 AM  Tasha Nguyen  has presented today for surgery, with the diagnosis of lung nodules.  The various methods of treatment have been discussed with the patient and family. After consideration of risks, benefits and other options for treatment, the patient has consented to  Procedure(s): VIDEO BRONCHOSCOPY WITH ENDOBRONCHIAL NAVIGATION (Right) as a surgical intervention.  The patient's history has been reviewed, patient examined, no change in status, stable for surgery.  I have reviewed the patient's chart and labs.  Questions were answered to the patient's satisfaction.     Lamar GORMAN Chris

## 2024-01-17 NOTE — Anesthesia Preprocedure Evaluation (Addendum)
 Anesthesia Evaluation  Patient identified by MRN, date of birth, ID band Patient awake    Reviewed: Allergy & Precautions, NPO status , Patient's Chart, lab work & pertinent test results  Airway Mallampati: II  TM Distance: >3 FB Neck ROM: Full    Dental no notable dental hx. (+) Edentulous Upper, Edentulous Lower   Pulmonary COPD, former smoker   Pulmonary exam normal        Cardiovascular hypertension, Pt. on medications  Rhythm:Regular Rate:Normal     Neuro/Psych   Anxiety     negative neurological ROS     GI/Hepatic Neg liver ROS,GERD  Medicated,,  Endo/Other  negative endocrine ROS    Renal/GU negative Renal ROS  negative genitourinary   Musculoskeletal negative musculoskeletal ROS (+)    Abdominal Normal abdominal exam  (+)   Peds  Hematology Lab Results      Component                Value               Date                      WBC                      7.5                 01/17/2024                HGB                      13.2                01/17/2024                HCT                      40.5                01/17/2024                MCV                      97.4                01/17/2024                PLT                      379                 01/17/2024             Lab Results      Component                Value               Date                      NA                       142                 10/18/2023                K  4.5                 10/18/2023                CO2                      20                  10/18/2023                GLUCOSE                  96                  10/18/2023                BUN                      10                  10/18/2023                CREATININE               0.69                10/18/2023                CALCIUM                   10.0                10/18/2023                EGFR                     96                  10/18/2023                 GFRNONAA                 >60                 09/23/2022              Anesthesia Other Findings   Reproductive/Obstetrics                              Anesthesia Physical Anesthesia Plan  ASA: 3  Anesthesia Plan: General   Post-op Pain Management:    Induction: Intravenous  PONV Risk Score and Plan: 3 and Ondansetron , Dexamethasone, Midazolam and Treatment may vary due to age or medical condition  Airway Management Planned: Mask and Oral ETT  Additional Equipment: None  Intra-op Plan:   Post-operative Plan: Extubation in OR  Informed Consent: I have reviewed the patients History and Physical, chart, labs and discussed the procedure including the risks, benefits and alternatives for the proposed anesthesia with the patient or authorized representative who has indicated his/her understanding and acceptance.     Dental advisory given  Plan Discussed with:   Anesthesia Plan Comments:          Anesthesia Quick Evaluation

## 2024-01-17 NOTE — Op Note (Signed)
 Procedure Note  Patient: Tasha Nguyen  [employer] Cios mobile C-arm was utilized to identify and biopsy right upper lobe nodule.  Needle-in-lesion was confirmed using real-time Cios imaging, and images were uploaded to PACS.      Lamar Chris, MD, PhD 01/17/2024, 12:42 PM  Pulmonary and Critical Care [phone] or if no answer before 7:00PM call [phone] For any issues after 7:00PM please call eLink [phone]

## 2024-01-17 NOTE — Discharge Instructions (Addendum)

## 2024-01-17 NOTE — Anesthesia Procedure Notes (Addendum)
 Procedure Name: Intubation Date/Time: 01/17/2024 11:18 AM  Performed by: Mollie Olivia SAUNDERS, CRNAPre-anesthesia Checklist: Patient identified, Emergency Drugs available, Suction available, Patient being monitored and Timeout performed Patient Re-evaluated:Patient Re-evaluated prior to induction Oxygen Delivery Method: Circle system utilized Preoxygenation: Pre-oxygenation with 100% oxygen Induction Type: IV induction Ventilation: Mask ventilation without difficulty Laryngoscope Size: Glidescope and 3 Grade View: Grade I Tube type: Oral Tube size: 8.5 mm Number of attempts: 1 Airway Equipment and Method: Video-laryngoscopy and Rigid stylet Placement Confirmation: ETT inserted through vocal cords under direct vision, breath sounds checked- equal and bilateral and CO2 detector Secured at: 21 cm Tube secured with: Tape Dental Injury: Teeth and Oropharynx as per pre-operative assessment

## 2024-01-17 NOTE — Transfer of Care (Signed)
 Immediate Anesthesia Transfer of Care Note  Patient: Tasha Nguyen  Procedure(s) Performed: VIDEO BRONCHOSCOPY WITH ENDOBRONCHIAL NAVIGATION (Right)  Patient Location: PACU  Anesthesia Type:General  Level of Consciousness: awake, alert , oriented, and drowsy  Airway & Oxygen Therapy: Patient Spontanous Breathing and Patient connected to face mask oxygen  Post-op Assessment: Report given to RN, Post -op Vital signs reviewed and stable, and Patient moving all extremities X 4  Post vital signs: Reviewed and stable  Last Vitals:  Vitals Value Taken Time  BP 112/88 01/17/24 12:18  Temp    Pulse 88 01/17/24 12:22  Resp 22 01/17/24 12:22  SpO2 99 % 01/17/24 12:22  Vitals shown include unfiled device data.  Last Pain:  Vitals:   01/17/24 0933  TempSrc:   PainSc: 0-No pain         Complications: No notable events documented.

## 2024-01-17 NOTE — Op Note (Signed)
 Video Bronchoscopy with Robotic Assisted Bronchoscopic Navigation   Date of Operation: 01/17/2024   Pre-op Diagnosis: Right upper lobe nodule  Post-op Diagnosis: Same  Surgeon: Lamar Chris   Assistants: None  Anesthesia: General endotracheal anesthesia  Operation: Flexible video fiberoptic bronchoscopy with robotic assistance and biopsies.  Estimated Blood Loss: Minimal  Complications: None  Indications and History: Tasha Nguyen is a 68 y.o. female with history of tobacco use who was found to have a slowly enlarging mixed density pulmonary nodule the right upper lobe suspicious for adenocarcinoma.  Recommendation made to achieve a tissue diagnosis via robotic assisted navigational bronchoscopy.  The risks, benefits, complications, treatment options and expected outcomes were discussed with the patient.  The possibilities of pneumothorax, pneumonia, reaction to medication, pulmonary aspiration, perforation of a viscus, bleeding, failure to diagnose a condition and creating a complication requiring transfusion or operation were discussed with the patient who freely signed the consent.    Description of Procedure: The patient was seen in the Preoperative Area, was examined and was deemed appropriate to proceed.  The patient was taken to [facility] Endoscopy room 3, identified as Tasha Nguyen and the procedure verified as Flexible Video Fiberoptic Bronchoscopy.  A Time Out was held and the above information confirmed.   Prior to the date of the procedure a high-resolution CT scan of the chest was performed. Utilizing ION software program a virtual tracheobronchial tree was generated to allow the creation of distinct navigation pathways to the patient's parenchymal abnormalities. After being taken to the operating room general anesthesia was initiated and the patient  was orally intubated. The video fiberoptic bronchoscope was introduced via the endotracheal tube and a general inspection was  performed which showed normal right and left lung anatomy. Aspiration of the bilateral mainstems was completed to remove any remaining secretions. Robotic catheter inserted into patient's endotracheal tube.   Target #1 right upper lobe nodule: The distinct navigation pathways prepared prior to this procedure were then utilized to navigate to patient's lesion identified on CT scan. The robotic catheter was secured into place and the vision probe was withdrawn.  Lesion location was approximated using fluoroscopy.  Local registration and targeting was performed using [employer] Cios mobile C-arm three-dimensional imaging. Under fluoroscopic guidance transbronchial needle brushings, transbronchial cryoprobe biopsies were performed to be sent for cytology and pathology.  Needle-in-lesion was confirmed using Cios mobile C-arm. Under fluoroscopic guidance a single fiducial marker was placed adjacent to the nodule.   At the end of the procedure a general airway inspection was performed and there was no evidence of active bleeding. The bronchoscope was removed.  The patient tolerated the procedure well. There was no significant blood loss and there were no obvious complications. A post-procedural chest x-ray is pending.  Samples Target #1: 1. Transbronchial Wang needle biopsies from right upper lobe nodule 2. Transbronchial cryoprobe biopsies from right upper lobe nodule    Plans:  The patient will be discharged from the PACU to home when recovered from anesthesia and after chest x-ray is reviewed. We will review the cytology, pathology and microbiology results with the patient when they become available. Outpatient followup will be with Dr Pleas.    Lamar Chris, MD, PhD 01/17/2024, 12:11 PM [facility] [phone] or if no answer before 7:00PM call [phone] For any issues after 7:00PM please call eLink [phone]

## 2024-01-18 ENCOUNTER — Ambulatory Visit: Admitting: Family Medicine

## 2024-01-18 ENCOUNTER — Encounter (HOSPITAL_COMMUNITY): Payer: Self-pay | Admitting: Emergency Medicine

## 2024-01-18 ENCOUNTER — Ambulatory Visit: Payer: Self-pay | Admitting: Emergency Medicine

## 2024-01-19 LAB — CYTOLOGY - NON PAP

## 2024-01-19 NOTE — Telephone Encounter (Signed)
 PC placed to pt this a.m.  Pt informed that the bx done on RT lung nodule came back positive for cancer. Advise to keep upcoming appt with pulmonary Dr. Pleas to discuss next steps in management.  Pt reports that since the bx she has been coughing up a little blood and wonders if it was due to the bx. Advised that it is most likely due to recent bx and should clear but it is best that she contact her pulmonologist to report to see if she concurs.  Pt expressed understanding and all questions were answered.

## 2024-01-20 NOTE — Addendum Note (Signed)
 Addendum  created 01/20/24 0827 by Dorethea Cordella SQUIBB, DO   Intraprocedure Staff edited

## 2024-01-25 ENCOUNTER — Ambulatory Visit: Admitting: Acute Care

## 2024-01-27 ENCOUNTER — Telehealth: Payer: Self-pay

## 2024-01-27 ENCOUNTER — Other Ambulatory Visit: Payer: Self-pay

## 2024-01-27 DIAGNOSIS — R911 Solitary pulmonary nodule: Secondary | ICD-10-CM

## 2024-01-27 NOTE — Telephone Encounter (Addendum)
 Thoracic Tumor Board Review  Tasha Nguyen was reviewed before presentation on the on the Thoracic Tumor Board on 01/27/2024.   Tasha Nguyen meets National Comprehensive Cancer Network (NCCN) criteria for genetic testing for Hereditary Breast and Ovarian Cancer Syndrome based on her family history of ovarian cancer in her mother. Additionally, there is a reported family history of breast cancer at age 66 in a cousin.   Santana Fryer, MS, CGC  Certified Genetic Counselor  Email: [email or web address]  Phone: [phone]

## 2024-01-28 NOTE — Telephone Encounter (Signed)
 She is agreeable to genetic counseling.  The only order I see is genetic counseling OB.  How is the referral to your department placed?  Thank you.

## 2024-01-31 NOTE — Telephone Encounter (Addendum)
 Thank you for taking the time to talk to her about a referral.   If you search for Ambulatory Referral to Genetics that should populate a genetics referral that looks something like the image below. You can select [facility] for our [facility] clinic at [facility].      Additionally, referrals can be placed to oncology genetic counseling using the fax number: [phone]. Alternatively, EPIC referrals placed to [facility] using the REF26 under the service.  Please let me know if you have any issues and I'm happy to help.   Jaysion Ramseyer

## 2024-01-31 NOTE — Addendum Note (Signed)
 Addended by: Miriana Gaertner on: 01/31/2024 08:40 PM   Modules accepted: Orders

## 2024-02-02 ENCOUNTER — Ambulatory Visit

## 2024-02-02 VITALS — BP 130/80 | HR 88 | Temp 98.3°F | Ht 69.0 in | Wt 121.0 lb

## 2024-02-02 DIAGNOSIS — J439 Emphysema, unspecified: Secondary | ICD-10-CM

## 2024-02-02 DIAGNOSIS — Z825 Family history of asthma and other chronic lower respiratory diseases: Secondary | ICD-10-CM

## 2024-02-02 DIAGNOSIS — C3491 Malignant neoplasm of unspecified part of right bronchus or lung: Secondary | ICD-10-CM

## 2024-02-02 DIAGNOSIS — Z87891 Personal history of nicotine dependence: Secondary | ICD-10-CM | POA: Diagnosis not present

## 2024-02-02 NOTE — Progress Notes (Signed)
 New Patient Pulmonology Office Visit   Initial HPI 01/03/2024  Patient ID: Tasha Nguyen, female    DOB: 1955-06-15  MRN: [id number]  Referred by: Delbert Clam, MD  CC:  Chief Complaint  Patient presents with   Follow-up    X-ray and Pet scan result     HPI Tasha Nguyen is a 68 y.o. female with who is referred to this clinic for abnormal CT chest. She is a former smoker with over 20-pack-year smoking history.  Underwent a CT chest in March/2025 which showed suspicious semisolid right upper lobe lung nodules along with few other nodular/atelectatic areas at bilateral bases.  Underwent a follow-up CT chest in September/2025 which shows persistence of the right upper lobe lung nodule (may be slightly more dense).  This scan also showed a new nodule on right lower lobe-dense/1.2 cm in size. The bilateral lower nodular/atelectatic areas are more prominent on recent scans.  Hence she was referred to pulmonary clinic  Her pulmonary function test from March/2025 has shown obstruction with significant bronchodilator response.  It also revealed air trapping.  She has been on Incruse since then and reports positive response.  She works 2 full-time job.  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 Her 2 sisters were present in today's clinic visit Patient drinks 5 beer daily. She quit smoking but apparently smokes weed daily  Review of system positive for shortness of breath    Subjective:   Underwent PET CT which showed improvement on lower nodules The right upper lobe was persistent but didn't show uptake Pt underwent bronch bx on 10/13- it revealed adenocarcinoma Pt is here to review results Reports she has quit smoking weeds and her cough has improved tremendously    ROS Review of symptoms negative except mentioned above   Allergies: Hydrocodone-acetaminophen , Oxycodone, and Oxycodone-acetaminophen   Current Outpatient Medications:    albuterol  (VENTOLIN  HFA) 108 (90 Base)  MCG/ACT inhaler, Inhale 2 puffs into the lungs every 6 (six) hours as needed for wheezing or shortness of breath., Disp: 8 g, Rfl: 2   amLODipine  (NORVASC ) 5 MG tablet, Take 1 tablet (5 mg total) by mouth daily., Disp: 90 tablet, Rfl: 1   atorvastatin  (LIPITOR) 40 MG tablet, Take 1 tablet (40 mg total) by mouth daily., Disp: 90 tablet, Rfl: 1   fluticasone  (FLONASE ) 50 MCG/ACT nasal spray, Place 2 sprays into both nostrils daily. (Patient taking differently: Place 2 sprays into both nostrils daily. PRN), Disp: 16 g, Rfl: 6   pantoprazole  (PROTONIX ) 40 MG tablet, TAKE 1 TABLET BY MOUTH EVERY DAY, Disp: 60 tablet, Rfl: 0   cetirizine  (ZYRTEC ) 10 MG tablet, Take 1 tablet (10 mg total) by mouth daily. (Patient not taking: Reported on 02/02/2024), Disp: 30 tablet, Rfl: 1   mirtazapine  (REMERON  SOL-TAB) 15 MG disintegrating tablet, TAKE 1 TABLET BY MOUTH EVERYDAY AT BEDTIME (Patient not taking: Reported on 02/02/2024), Disp: 90 tablet, Rfl: 1   mirtazapine  (REMERON  SOL-TAB) 30 MG disintegrating tablet, Take 1 tablet (30 mg total) by mouth at bedtime. (Patient not taking: Reported on 02/02/2024), Disp: 90 tablet, Rfl: 1 Past Medical History:  Diagnosis Date   Colon polyps    Dyspnea    occasional with exertion - has inhalers   GERD (gastroesophageal reflux disease)    on protonix    History of kidney stones    passed stone   HLD (hyperlipidemia)    on lipitor   Hypertension    on amlodipine    Lung nodules  right lung   Pneumonia    x 1 as a child   Pre-diabetes 10/18/2023   Seasonal allergies    on zyrtec    Past Surgical History:  Procedure Laterality Date   COLONOSCOPY  2021   hx polyps - repeat q59yrs   MULTIPLE TOOTH EXTRACTIONS     dentures   VIDEO BRONCHOSCOPY WITH ENDOBRONCHIAL NAVIGATION Right 01/17/2024   Procedure: VIDEO BRONCHOSCOPY WITH ENDOBRONCHIAL NAVIGATION;  Surgeon: Shelah Lamar RAMAN, MD;  Location: [facility];  Service: Pulmonary;  Laterality: Right;   Family History   Problem Relation Age of Onset   Diabetes Mother    Ovarian cancer Mother 43   CAD Father    CAD Sister    Diabetes Sister    Diabetes Sister    Breast cancer Cousin 22   Colon cancer Neg Hx    Esophageal cancer Neg Hx    Stomach cancer Neg Hx    Pancreatic cancer Neg Hx    Liver disease Neg Hx    Rectal cancer Neg Hx    BRCA 1/2 Neg Hx    Social History   Socioeconomic History   Marital status: Single    Spouse name: Not on file   Number of children: Not on file   Years of education: Not on file   Highest education level: Not on file  Occupational History   Not on file  Tobacco Use   Smoking status: Former    Current packs/day: 0.00    Average packs/day: 1 pack/day for 26.1 years (26.1 ttl pk-yrs)    Types: Cigarettes    Start date: 04/18/1987    Quit date: 05/12/2013    Years since quitting: 10.7   Smokeless tobacco: Never  Vaping Use   Vaping status: Never Used  Substance and Sexual Activity   Alcohol use: Yes    Alcohol/week: 10.0 standard drinks of alcohol    Types: 10 Standard drinks or equivalent per week    Comment: weekends-beers/ wine   Drug use: Yes    Types: Marijuana    Comment: occ - last use was in 12/2023   Sexual activity: Not Currently    Birth control/protection: Post-menopausal  Other Topics Concern   Not on file  Social History Narrative   Not on file   Social Drivers of Health   Financial Resource Strain: Not on file  Food Insecurity: Not on file  Transportation Needs: Not on file  Physical Activity: Not on file  Stress: Not on file  Social Connections: Not on file  Intimate Partner Violence: Not on file         Objective:  BP 130/80   Pulse 88   Temp 98.3 F (36.8 C) (Oral)   Ht 5' 9 (1.753 m)   Wt 121 lb (54.9 kg)   SpO2 96%   BMI 17.87 kg/m    Physical Exam Constitutional:      General: She is not in acute distress.    Appearance: Normal appearance.  HENT:     Mouth/Throat:     Mouth: Mucous membranes are  moist.  Cardiovascular:     Rate and Rhythm: Normal rate.  Pulmonary:     Effort: No respiratory distress.     Breath sounds: No wheezing or rales.  Musculoskeletal:     Right lower leg: No edema.     Left lower leg: No edema.  Skin:    General: Skin is warm.  Neurological:     Mental Status: She is  alert and oriented to person, place, and time.  Psychiatric:        Mood and Affect: Mood normal.     Diagnostic Review:    Pft    Latest Ref Rng & Units 06/15/2023   11:53 AM  PFT Results  FVC-Pre L 2.97   FVC-Predicted Pre % 78   FVC-Post L 3.20   FVC-Predicted Post % 84   Pre FEV1/FVC % % 53   Post FEV1/FCV % % 55   FEV1-Pre L 1.58   FEV1-Predicted Pre % 54   FEV1-Post L 1.77   DLCO uncorrected ml/min/mmHg 14.74   DLCO UNC% % 63   DLVA Predicted % 74   TLC L 6.75   TLC % Predicted % 116   RV % Predicted % 151    FEV1 12% change  PET 01/2024 1. No hypermetabolic pulmonary nodule. The dominant anterior Right lower lobe density on the prior CT is nearly completely resolved and was likely atelectasis. The patient can return to lung cancer screening on or after 9 / 5 / 26. 2. Left iliac mildly Hypermetabolic Lucent Lesion is new since 2021 and indeterminate. Considerations include hemangioma,m myeloma, or less likely metastatic disease. Consider pre and postcontrast pelvic MRI for further evaluation. 3. Aortic atherosclerosis (icd10-i70.0)  CT chest 12/2023 1. Lung-RADS 4B, suspicious. Additional imaging evaluation or consultation with Pulmonology or Thoracic Surgery recommended. New 10.4 mm nodular opacity in the anterior right lower lobe may be atelectatic. 2. Remaining pulmonary nodules are not substantially changed. 3. Similar atelectasis or scarring in both posterior lower lobes with more nodular configuration in the posterior right costophrenic sulcus on the current study. 4. Aortic Atherosclerosi      CT chest in March/2025 shows suspicious semisolid  right upper lobe lung nodules along with few other nodular/atelectatic areas at bilateral bases.    follow-up CT chest in September/2025 showed persistence of the right upper lobe lung nodule (may be slightly more dense).  This scan also showed a new nodule on right lower lobe- dense/1.2 cm in size. The bilateral lower nodular/atelectatic areas are more prominent on recent scans.      Assessment & Plan:   Assessment & Plan Adenocarcinoma of right lung (HCC) RUL 10.4 mm peripheral semisolid nodule which is biopsy proven to be adenocarcinoma Remaining lower lung nodules on CT chest 12/2023 had disappeared/ resolved on short interval PET/CT, suggestive on inflammatory process No concerning mediastinal or hilar uptake on PET Will refer pt to oncology PET does mention Left iliac mildly Hypermetabolic Lucent Lesion- indeterminate- ?MM, hemangioma- will defer myeloma work up to oncology     Former smoker Congratulated on quitting smoking Fam hx of asthma and PFT has shown positive BD response.       Chronic obstructive pulmonary disease with emphysema, unspecified emphysema type (HCC) Fam hx of asthma and PFT has shown positive BD response. PFT 06/2023 reviewed Pt has since quit smoking and doesn't have pulmonary symptoms Should tolerate lobectomy if offered but can repeat PFT if needed     I personally spent a total of 30 minutes in the care of the patient today including performing a medically appropriate exam/evaluation, counseling and educating, placing orders, referring and communicating with other health care professionals, documenting clinical information in the EHR, and independently interpreting results.  Thank you for the opportunity to take part in the care of Tasha Nguyen   Return in about 4 months (around 06/03/2024).   Dolorez Jeffrey Pleas, MD [facility] Office: [phone]

## 2024-02-02 NOTE — Progress Notes (Signed)
 The proposed treatment discussed in conference is for discussion purpose only and is not a binding recommendation.  The patients have not been physically examined, or presented with their treatment options.  Therefore, final treatment plans cannot be decided.

## 2024-02-02 NOTE — Assessment & Plan Note (Signed)
 Fam hx of asthma and PFT has shown positive BD response. PFT 06/2023 reviewed Pt has since quit smoking and doesn't have pulmonary symptoms Should tolerate lobectomy if offered but can repeat PFT if needed

## 2024-02-02 NOTE — Patient Instructions (Signed)
 It was a pleasure to see you today. Please call us  at [phone] if you dont hear from oncology/cancer clinic within 1-2 weeks

## 2024-02-07 ENCOUNTER — Inpatient Hospital Stay: Attending: Internal Medicine | Admitting: Internal Medicine

## 2024-02-07 ENCOUNTER — Inpatient Hospital Stay

## 2024-02-07 ENCOUNTER — Other Ambulatory Visit: Payer: Self-pay | Admitting: *Deleted

## 2024-02-07 VITALS — BP 132/88 | HR 98 | Temp 97.8°F | Resp 17 | Ht 69.0 in | Wt 117.0 lb

## 2024-02-07 DIAGNOSIS — C801 Malignant (primary) neoplasm, unspecified: Secondary | ICD-10-CM

## 2024-02-07 DIAGNOSIS — C3491 Malignant neoplasm of unspecified part of right bronchus or lung: Secondary | ICD-10-CM

## 2024-02-07 DIAGNOSIS — C3411 Malignant neoplasm of upper lobe, right bronchus or lung: Secondary | ICD-10-CM | POA: Diagnosis present

## 2024-02-07 HISTORY — DX: Malignant (primary) neoplasm, unspecified: C80.1

## 2024-02-07 LAB — CBC WITH DIFFERENTIAL (CANCER CENTER ONLY)
Abs Immature Granulocytes: 0.01 K/uL (ref 0.00–0.07)
Basophils Absolute: 0 K/uL (ref 0.0–0.1)
Basophils Relative: 1 %
Eosinophils Absolute: 0 K/uL (ref 0.0–0.5)
Eosinophils Relative: 0 %
HCT: 37.3 % (ref 36.0–46.0)
Hemoglobin: 12.6 g/dL (ref 12.0–15.0)
Immature Granulocytes: 0 %
Lymphocytes Relative: 48 %
Lymphs Abs: 2.1 K/uL (ref 0.7–4.0)
MCH: 31.6 pg (ref 26.0–34.0)
MCHC: 33.8 g/dL (ref 30.0–36.0)
MCV: 93.5 fL (ref 80.0–100.0)
Monocytes Absolute: 0.4 K/uL (ref 0.1–1.0)
Monocytes Relative: 8 %
Neutro Abs: 1.9 K/uL (ref 1.7–7.7)
Neutrophils Relative %: 43 %
Platelet Count: 368 K/uL (ref 150–400)
RBC: 3.99 MIL/uL (ref 3.87–5.11)
RDW: 13.8 % (ref 11.5–15.5)
WBC Count: 4.4 K/uL (ref 4.0–10.5)
nRBC: 0 % (ref 0.0–0.2)

## 2024-02-07 LAB — CMP (CANCER CENTER ONLY)
ALT: 15 U/L (ref 0–44)
AST: 17 U/L (ref 15–41)
Albumin: 4.4 g/dL (ref 3.5–5.0)
Alkaline Phosphatase: 65 U/L (ref 38–126)
Anion gap: 7 (ref 5–15)
BUN: 11 mg/dL (ref 8–23)
CO2: 25 mmol/L (ref 22–32)
Calcium: 9.6 mg/dL (ref 8.9–10.3)
Chloride: 101 mmol/L (ref 98–111)
Creatinine: 0.68 mg/dL (ref 0.44–1.00)
GFR, Estimated: >60 mL/min (ref >60)
Glucose, Bld: 152 mg/dL — ABNORMAL HIGH (ref 70–99)
Potassium: 4.2 mmol/L (ref 3.5–5.1)
Sodium: 133 mmol/L — ABNORMAL LOW (ref 135–145)
Total Bilirubin: 0.5 mg/dL (ref 0.0–1.2)
Total Protein: 7.5 g/dL (ref 6.5–8.1)

## 2024-02-07 NOTE — Progress Notes (Signed)
 [facility] Telephone:([phone]) [phone]   Fax:([phone]  CONSULT NOTE  REFERRING PHYSICIAN: Dr. Lamar Chris  REASON FOR CONSULTATION:  68 years old African-American female diagnosed with lung cancer  HPI GOLDYE TOURANGEAU is a 68 y.o. female with past medical history significant for dyslipidemia, kidney stone, GERD, colon polyps, hypertension, history of pneumonia and prediabetes as well as seasonal allergy.  The patient has a history of smoking and she was enrolled in the CT chest lung cancer screening and she had a CT on 12/10/2023 that showed new 10.4 mm nodular opacity in the anterior right lower lobe.  This was followed by PET scan on January 06, 2024 and it showed no hypermetabolic pulmonary nodule but the dominant anterior right lower lobe density on the prior scan is nearly completely resolved and was likely atelectasis..  There was a left iliac mildly hypermetabolic lucent lesions at new from 2021 and was indeterminate.  On January 17, 2024 the patient underwent video bronchoscopy with robotic assisted bronchoscopic navigation under the care of Dr. Chris.  The final cytology from the fine-needle aspiration of the right upper lobe ([id number]) was consistent with adenocarcinoma.  The patient was referred to me today for evaluation and recommendation regarding treatment of her condition.  HPI  Discussed the use of AI scribe software for clinical note transcription with the patient, who gave verbal consent to proceed.  History of Present Illness MERI PELOT is a 68 year old female with recently diagnosed lung cancer who presents for evaluation. She is accompanied by her sister, Samule.  She was diagnosed with lung cancer following a routine checkup where wheezing was noted. An initial chest X-ray was performed, followed by a repeat X-ray six months later due to persistent concerns. She was then referred to a pulmonologist after her doctor noted concerns on her chest  X-ray.  A CT scan on December 10, 2023, revealed a new 10.4 mm nodule in the right lower lobe and a pre-existing nodule in the right upper lobe. A subsequent PET scan on January 06, 2024, showed the disappearance of the right lower lobe nodule, but the right upper lobe nodule persisted. A bronchoscopy on January 17, 2024, confirmed adenocarcinoma in the right upper lobe.  She feels generally well, with occasional back pain and a slight cough with blood following the bronchoscopy. No regular chest pain, shortness of breath, significant weight loss, headaches, vision changes, nausea, vomiting, or diarrhea.  Her past medical history includes high blood pressure, prediabetes, high cholesterol, acid reflux, and a history of kidney stones over fourteen years ago. Family history is significant for heart disease in her father and breast cancer in a cousin. Her sister, Samule, has congestive heart disease.  Social history reveals she is single with three children and works as a investment banker, operational at [employer]. She quit smoking approximately fifteen years ago and stopped using marijuana and alcohol upon her cancer diagnosis.     Past Medical History:  Diagnosis Date   Colon polyps    Dyspnea    occasional with exertion - has inhalers   GERD (gastroesophageal reflux disease)    on protonix    History of kidney stones    passed stone   HLD (hyperlipidemia)    on lipitor   Hypertension    on amlodipine    Lung nodules    right lung   Pneumonia    x 1 as a child   Pre-diabetes 10/18/2023   Seasonal allergies  on zyrtec       Past Surgical History:  Procedure Laterality Date   COLONOSCOPY  2021   hx polyps - repeat q15yrs   MULTIPLE TOOTH EXTRACTIONS     dentures   VIDEO BRONCHOSCOPY WITH ENDOBRONCHIAL NAVIGATION Right 01/17/2024   Procedure: VIDEO BRONCHOSCOPY WITH ENDOBRONCHIAL NAVIGATION;  Surgeon: Shelah Lamar RAMAN, MD;  Location: [facility];  Service: Pulmonary;  Laterality: Right;     Family History  Problem Relation Age of Onset   Diabetes Mother    Ovarian cancer Mother 60   CAD Father    CAD Sister    Diabetes Sister    Diabetes Sister    Breast cancer Cousin 22   Colon cancer Neg Hx    Esophageal cancer Neg Hx    Stomach cancer Neg Hx    Pancreatic cancer Neg Hx    Liver disease Neg Hx    Rectal cancer Neg Hx    BRCA 1/2 Neg Hx     Social History Social History   Tobacco Use   Smoking status: Former    Current packs/day: 0.00    Average packs/day: 1 pack/day for 26.1 years (26.1 ttl pk-yrs)    Types: Cigarettes    Start date: 04/18/1987    Quit date: 05/12/2013    Years since quitting: 10.7   Smokeless tobacco: Never  Vaping Use   Vaping status: Never Used  Substance Use Topics   Alcohol use: Yes    Alcohol/week: 10.0 standard drinks of alcohol    Types: 10 Standard drinks or equivalent per week    Comment: weekends-beers/ wine   Drug use: Yes    Types: Marijuana    Comment: occ - last use was in 12/2023    Allergies  Allergen Reactions   Hydrocodone-Acetaminophen      Other reaction(s): Other (See Comments) hallicinations   Oxycodone Other (See Comments)    Hallucinations Other reaction(s): Other (See Comments) Hallucinations   Oxycodone-Acetaminophen      Other reaction(s): Other (See Comments) hallucinations    Current Outpatient Medications  Medication Sig Dispense Refill   albuterol  (VENTOLIN  HFA) 108 (90 Base) MCG/ACT inhaler Inhale 2 puffs into the lungs every 6 (six) hours as needed for wheezing or shortness of breath. 8 g 2   amLODipine  (NORVASC ) 5 MG tablet Take 1 tablet (5 mg total) by mouth daily. 90 tablet 1   atorvastatin  (LIPITOR) 40 MG tablet Take 1 tablet (40 mg total) by mouth daily. 90 tablet 1   cetirizine  (ZYRTEC ) 10 MG tablet Take 1 tablet (10 mg total) by mouth daily. (Patient not taking: Reported on 02/02/2024) 30 tablet 1   fluticasone  (FLONASE ) 50 MCG/ACT nasal spray Place 2 sprays into both nostrils  daily. (Patient taking differently: Place 2 sprays into both nostrils daily. PRN) 16 g 6   mirtazapine  (REMERON  SOL-TAB) 15 MG disintegrating tablet TAKE 1 TABLET BY MOUTH EVERYDAY AT BEDTIME (Patient not taking: Reported on 02/02/2024) 90 tablet 1   mirtazapine  (REMERON  SOL-TAB) 30 MG disintegrating tablet Take 1 tablet (30 mg total) by mouth at bedtime. (Patient not taking: Reported on 02/02/2024) 90 tablet 1   pantoprazole  (PROTONIX ) 40 MG tablet TAKE 1 TABLET BY MOUTH EVERY DAY 60 tablet 0   No current facility-administered medications for this visit.    Review of Systems  Constitutional: negative Eyes: negative Ears, nose, mouth, throat, and face: negative Respiratory: negative Cardiovascular: negative Gastrointestinal: negative Genitourinary:negative Integument/breast: negative Hematologic/lymphatic: negative Musculoskeletal:negative Neurological: negative Behavioral/Psych: negative Endocrine: negative Allergic/Immunologic: negative  Physical  Exam  MJO:jozmu, healthy, no distress, well nourished, and well developed SKIN: skin color, texture, turgor are normal, no rashes or significant lesions HEAD: Normocephalic, No masses, lesions, tenderness or abnormalities EYES: normal, PERRLA, Conjunctiva are pink and non-injected EARS: External ears normal, Canals clear OROPHARYNX:no exudate, no erythema, and lips, buccal mucosa, and tongue normal  NECK: supple, no adenopathy, no JVD LYMPH:  no palpable lymphadenopathy, no hepatosplenomegaly BREAST:not examined LUNGS: clear to auscultation , and palpation HEART: regular rate & rhythm, no murmurs, and no gallops ABDOMEN:abdomen soft, non-tender, normal bowel sounds, and no masses or organomegaly BACK: Back symmetric, no curvature., No CVA tenderness EXTREMITIES:no joint deformities, effusion, or inflammation, no edema  NEURO: alert & oriented x 3 with fluent speech, no focal motor/sensory deficits  PERFORMANCE STATUS: ECOG  1  LABORATORY DATA: Lab Results  Component Value Date   WBC 4.4 02/07/2024   HGB 12.6 02/07/2024   HCT 37.3 02/07/2024   MCV 93.5 02/07/2024   PLT 368 02/07/2024      Chemistry      Component Value Date/Time   NA 142 01/17/2024 0921   NA 142 10/18/2023 0946   K 3.9 01/17/2024 0921   CL 107 01/17/2024 0921   CO2 24 01/17/2024 0921   BUN 5 (L) 01/17/2024 0921   BUN 10 10/18/2023 0946   CREATININE 0.64 01/17/2024 0921      Component Value Date/Time   CALCIUM  9.4 01/17/2024 0921   ALKPHOS 74 01/17/2024 0921   AST 24 01/17/2024 0921   ALT 19 01/17/2024 0921   BILITOT 0.6 01/17/2024 0921   BILITOT 0.3 10/18/2023 0946       RADIOGRAPHIC STUDIES: DG Chest Port 1 View Result Date: 01/17/2024 CLINICAL DATA:  Bronchoscopic right upper lobe biopsy EXAM: PORTABLE CHEST 1 VIEW COMPARISON:  07/12/2014 and PET-CT from 01/06/2024 FINDINGS: Right upper lobe fiducial noted with adjacent faint density which could be from the underlying nodule and/or a small amount of blood products in the biopsy region. Emphysema noted.  Known pneumothorax or acute complicating feature. Cardiac and mediastinal margins appear normal. No blunting of the costophrenic angles. IMPRESSION: 1. Right upper lobe fiducial noted with adjacent faint density which could be from the underlying nodule and/or a small amount of blood products in the biopsy region. No pneumothorax or acute complicating feature. 2.  Emphysema (ICD10-J43.9). Electronically Signed   By: Ryan Salvage M.D.   On: 01/17/2024 13:10   DG C-ARM BRONCHOSCOPY Result Date: 01/17/2024 C-ARM BRONCHOSCOPY: Fluoroscopy was utilized by the requesting physician.  No radiographic interpretation.    ASSESSMENT: This is a very pleasant 68 years old African-American female with a stage Ia (t1a, N0, M0) non-small cell lung cancer, adenocarcinoma presented with right upper lobe lung nodule diagnosed in October 2025.   PLAN: I had a lengthy discussion with the  patient and her sister today about her current disease stage, prognosis and treatment options.  I personally and independently reviewed the scan images as well as the pathology report and discussed it with the patient and her sister. Assessment and Plan Assessment & Plan Right upper lobe lung adenocarcinoma, stage I Stage I non-small cell lung cancer, specifically adenocarcinoma, located in the right upper lobe. The tumor is small and early-stage, with no current need for chemotherapy, targeted therapy, or immunotherapy. Surgical resection is preferred for curative intent. If surgery is not feasible due to respiratory function or other contraindications, radiation therapy is an alternative. Surgery is expected to be robotic, with a short hospital stay  of less than three days. Post-surgery, further treatment may be considered based on pathological findings. - Referred to thoracic surgeon (Dr. Kerrin or Dr. Shyrl) for evaluation and potential surgical resection. - Ordered recent pulmonary function test to assess surgical candidacy. - If surgery is not feasible, will refer to radiation oncology for radiation therapy. - Scheduled follow-up appointment in six weeks to review surgical outcome and further treatment needs. The patient was advised to call immediately if she has any other concerning symptoms in the interval.  The patient voices understanding of current disease status and treatment options and is in agreement with the current care plan.  All questions were answered. The patient knows to call the clinic with any problems, questions or concerns. We can certainly see the patient much sooner if necessary.  Thank you so much for allowing me to participate in the care of ANALLELY ROSELL. I will continue to follow up the patient with you and assist in her care. The total time spent in the appointment was 60 minutes including review of chart and various tests results, discussions about plan of  care and coordination of care plan .   Disclaimer: This note was dictated with voice recognition software. Similar sounding words can inadvertently be transcribed and may not be corrected upon review.   Sherrod MARLA Sherrod February 07, 2024, 1:35 PM

## 2024-02-07 NOTE — Progress Notes (Signed)
 NN met with pt today at her consult with Dr. Sherrod. Pt was accompanied by her sister, sabrina. Plan for the pt is a referral to CT surgery for evaluation. Pt needs updated PFTs. NN will arrange. NN sent msgs to [facility] and [facility] staff requesting appts. NN provided pt with direct contact information and encouraged pt to call with any questions or concerns.

## 2024-02-09 ENCOUNTER — Telehealth: Payer: Self-pay | Admitting: Internal Medicine

## 2024-02-09 NOTE — Telephone Encounter (Signed)
 Scheduled patient for next appointments. Called and spoke with the patient, she is aware.

## 2024-02-09 NOTE — Progress Notes (Signed)
 NN reached out to [facility] by phone to request pt be scheduled for PFT before her CT surgery consult on 11/14. Left VM with pts information and request details.

## 2024-02-11 ENCOUNTER — Telehealth: Payer: Self-pay | Admitting: Family Medicine

## 2024-02-11 NOTE — Telephone Encounter (Signed)
 Pt unconfirmed appt lvm

## 2024-02-14 ENCOUNTER — Other Ambulatory Visit: Payer: Self-pay

## 2024-02-14 ENCOUNTER — Ambulatory Visit (HOSPITAL_COMMUNITY)
Admission: RE | Admit: 2024-02-14 | Discharge: 2024-02-14 | Disposition: A | Discharge status: Home / Self Care | Source: Ambulatory Visit | Attending: Internal Medicine | Admitting: Internal Medicine

## 2024-02-14 ENCOUNTER — Ambulatory Visit: Payer: Self-pay | Admitting: Family Medicine

## 2024-02-14 DIAGNOSIS — R911 Solitary pulmonary nodule: Secondary | ICD-10-CM

## 2024-02-14 DIAGNOSIS — C3411 Malignant neoplasm of upper lobe, right bronchus or lung: Secondary | ICD-10-CM | POA: Diagnosis present

## 2024-02-14 LAB — PULMONARY FUNCTION TEST
DL/VA % pred: 72 %
DL/VA: 2.89 ml/min/mmHg/L
DLCO cor % pred: 59 %
DLCO cor: 13.82 ml/min/mmHg
DLCO unc % pred: 57 %
DLCO unc: 13.47 ml/min/mmHg
FEF 25-75 Post: 0.74 L/s
FEF 25-75 Pre: 0.48 L/s
FEF2575-%Change-Post: 54 %
FEF2575-%Pred-Post: 31 %
FEF2575-%Pred-Pre: 20 %
FEV1-%Change-Post: 14 %
FEV1-%Pred-Post: 54 %
FEV1-%Pred-Pre: 47 %
FEV1-Post: 1.56 L
FEV1-Pre: 1.36 L
FEV1FVC-%Change-Post: 1 %
FEV1FVC-%Pred-Pre: 67 %
FEV6-%Change-Post: 13 %
FEV6-%Pred-Post: 73 %
FEV6-%Pred-Pre: 64 %
FEV6-Post: 2.65 L
FEV6-Pre: 2.33 L
FEV6FVC-%Change-Post: 1 %
FEV6FVC-%Pred-Post: 93 %
FEV6FVC-%Pred-Pre: 92 %
FVC-%Change-Post: 12 %
FVC-%Pred-Post: 78 %
FVC-%Pred-Pre: 69 %
FVC-Post: 2.94 L
FVC-Pre: 2.62 L
Post FEV1/FVC ratio: 53 %
Post FEV6/FVC ratio: 90 %
Pre FEV1/FVC ratio: 52 %
Pre FEV6/FVC Ratio: 89 %
RV % pred: 134 %
RV: 3.17 L
TLC % pred: 104 %
TLC: 6.08 L

## 2024-02-14 MED ORDER — ALBUTEROL SULFATE (2.5 MG/3ML) 0.083% IN NEBU
2.5000 mg | INHALATION_SOLUTION | Freq: Once | RESPIRATORY_TRACT | Status: AC
Start: 2024-02-14 — End: 2024-02-14
  Administered 2024-02-14: 2.5 mg via RESPIRATORY_TRACT

## 2024-02-14 NOTE — Progress Notes (Signed)
 NN returned pts phone call at this time. Pt requesting to speak to a LCSW. Pt now concerned about her energy levels and isn't sure she will continue to work the 2 jobs she has at this time and is interested in applying for disability. NN placed referral to Social Work and notified Devere Manna, LCSW via [facility] of referral. Pt appreciated assistance.

## 2024-02-15 ENCOUNTER — Inpatient Hospital Stay: Admitting: Licensed Clinical Social Worker

## 2024-02-15 DIAGNOSIS — C3491 Malignant neoplasm of unspecified part of right bronchus or lung: Secondary | ICD-10-CM

## 2024-02-16 NOTE — Progress Notes (Signed)
 [facility] Clinical Social Work  Initial Assessment   Tasha Nguyen is a 68 y.o. year old female contacted by phone. Clinical Social Work was referred by medical provider for assessment of psychosocial needs.   SDOH (Social Determinants of Health) assessments performed: Yes SDOH Interventions    Flowsheet Row Office Visit from 02/07/2024 in [facility] - A Dept Of [place]  SDOH Interventions   Food Insecurity Interventions Intervention Not Indicated  Housing Interventions Intervention Not Indicated  Transportation Interventions Intervention Not Indicated  Utilities Interventions Intervention Not Indicated    SDOH Screenings   Food Insecurity: No Food Insecurity (02/07/2024)  Housing: Unknown (02/07/2024)  Transportation Needs: No Transportation Needs (02/07/2024)  Utilities: Not At Risk (02/07/2024)  Depression (PHQ2-9): Low Risk  (02/07/2024)  Tobacco Use: Medium Risk (02/02/2024)    PHQ 2/9:    02/07/2024    2:18 PM 02/07/2024    2:09 PM 05/19/2023    8:50 AM  Depression screen PHQ 2/9  Decreased Interest 0 0 0  Down, Depressed, Hopeless 0 0 0  PHQ - 2 Score 0 0 0  Altered sleeping   0  Tired, decreased energy   0  Change in appetite   0  Feeling bad or failure about yourself    0  Trouble concentrating   0  Moving slowly or fidgety/restless   0  Suicidal thoughts   0  PHQ-9 Score   0      Data saved with a previous flowsheet row definition     Distress Screen completed: No     No data to display            Family/Social Information:  Housing Arrangement: patient lives alone Family members/support persons in your life? Pt sister resides nearby and assists as she is able; however, she does have a son w/ Down's Syndrome that she is the primary care taker for.  Pt has 1 son who resides locally and 2 sons who reside out of state.  Transportation concerns: no  Employment: Working full time at the [employer] as a investment banker, operational; however,  has had her hours cut over the last 3 months due to lack of business.   Income source: Employment Financial concerns: Yes, due to illness and/or loss of work during treatment Type of concern: Utilities Food access concerns: no Religious or spiritual practice: Yes-[employer] Advanced directives: No Services Currently in place:  none  Coping/ Adjustment to diagnosis: Patient understands treatment plan and what happens next? Pt currently undergoing diagnostics.  Pt to meet w/ a surgeon, after which treatment to be finalized. Concerns about diagnosis and/or treatment: Losing my job and/or losing income, Overwhelmed by information, Afraid of cancer, How will I care for myself, and Quality of life Patient reported stressors: Finances, Anxiety/ nervousness, and Adjusting to my illness Hopes and/or priorities: pt's priority is to determine the treatment plan and pursue treatment w/ the hope of positive results. Patient enjoys time with family/ friends Current coping skills/ strengths: Capable of independent living , Motivation for treatment/growth , Physical Health , and Supportive family/friends     SUMMARY: Current SDOH Barriers:  Financial constraints related to loss of income.  Clinical Social Work Clinical Goal(s):  No clinical social work goals at this time  Interventions: Discussed common feeling and emotions when being diagnosed with cancer, and the importance of support during treatment Informed patient of the support team roles and support services at [facility] Provided CSW contact  information and encouraged patient to call with any questions or concerns CSW briefly discussed financial resources available if chemo/radiation are recommended treatments.  CSW to follow up w/ pt if additional treatment beyond surgery is recommended.    Follow Up Plan: Patient will contact CSW with any support or resource needs Patient verbalizes understanding of plan: Yes    Devere JONELLE Manna, LCSW Clinical  Social Worker [facility]

## 2024-02-17 NOTE — Progress Notes (Unsigned)
 [address] [place] [address]             [phone]                   Tasha Nguyen [facility] Medical Record #[id number] Date of Birth: 1955-04-26  Referring: Sherrod Sherrod, MD Primary Care: Delbert Clam, MD Primary Cardiologist: None  Chief Complaint:   No chief complaint on file.   History of Present Illness:    Tasha Nguyen is a 68 y.o. female who presents for surgical evaluation of a ***  Tasha Nguyen is a 68 y.o. female with past medical history significant for dyslipidemia, kidney stone, GERD, colon polyps, hypertension, history of pneumonia and prediabetes as well as seasonal allergy.  The patient has a history of smoking and she was enrolled in the CT chest lung cancer screening and she had a CT on 12/10/2023 that showed new 10.4 mm nodular opacity in the anterior right lower lobe.  This was followed by PET scan on January 06, 2024 and it showed no hypermetabolic pulmonary nodule but the dominant anterior right lower lobe density on the prior scan is nearly completely resolved and was likely atelectasis..  There was a left iliac mildly hypermetabolic lucent lesions at new from 2021 and was indeterminate.  On January 17, 2024 the patient underwent video bronchoscopy with robotic assisted bronchoscopic navigation under the care of Dr. Shelah.  The final cytology from the fine-needle aspiration of the right upper lobe ([id number]) was consistent with adenocarcinoma.  The patient was referred to me today for evaluation and recommendation regarding treatment of her condition.   Smoking Hx: *** Zubrod Score: At the time of surgery this patient's most appropriate activity status/level should be described as: []     0    Normal activity, no symptoms []     1    Restricted in physical strenuous activity but ambulatory, able to do out light work []     2    Ambulatory and capable of self care, unable to do work activities, up and about               >50  % of waking hours                              []     3    Only limited self care, in bed greater than 50% of waking hours []     4    Completely disabled, no self care, confined to bed or chair []     5    Moribund     Past Medical History:  Diagnosis Date   Colon polyps    Dyspnea    occasional with exertion - has inhalers   GERD (gastroesophageal reflux disease)    on protonix    History of kidney stones    passed stone   HLD (hyperlipidemia)    on lipitor   Hypertension    on amlodipine    Lung nodules    right lung   Pneumonia    x 1 as a child   Pre-diabetes 10/18/2023   Seasonal allergies    on zyrtec     Past Surgical History:  Procedure Laterality Date   COLONOSCOPY  2021   hx polyps - repeat q44yrs   MULTIPLE TOOTH EXTRACTIONS     dentures   VIDEO BRONCHOSCOPY WITH ENDOBRONCHIAL NAVIGATION Right  01/17/2024   Procedure: VIDEO BRONCHOSCOPY WITH ENDOBRONCHIAL NAVIGATION;  Surgeon: Shelah Lamar RAMAN, MD;  Location: [facility] ENDOSCOPY;  Service: Pulmonary;  Laterality: Right;    Family History  Problem Relation Age of Onset   Diabetes Mother    Ovarian cancer Mother 75   CAD Father    CAD Sister    Diabetes Sister    Diabetes Sister    Breast cancer Cousin 22   Colon cancer Neg Hx    Esophageal cancer Neg Hx    Stomach cancer Neg Hx    Pancreatic cancer Neg Hx    Liver disease Neg Hx    Rectal cancer Neg Hx    BRCA 1/2 Neg Hx      Social History   Tobacco Use  Smoking Status Former   Current packs/day: 0.00   Average packs/day: 1 pack/day for 26.1 years (26.1 ttl pk-yrs)   Types: Cigarettes   Start date: 04/18/1987   Quit date: 05/12/2013   Years since quitting: 10.7  Smokeless Tobacco Never    Social History   Substance and Sexual Activity  Alcohol Use Yes   Alcohol/week: 10.0 standard drinks of alcohol   Types: 10 Standard drinks or equivalent per week   Comment: weekends-beers/ wine     Allergies  Allergen Reactions   Hydrocodone-Acetaminophen       Other reaction(s): Other (See Comments) hallicinations   Oxycodone Other (See Comments)    Hallucinations Other reaction(s): Other (See Comments) Hallucinations   Oxycodone-Acetaminophen      Other reaction(s): Other (See Comments) hallucinations    Current Outpatient Medications  Medication Sig Dispense Refill   albuterol  (VENTOLIN  HFA) 108 (90 Base) MCG/ACT inhaler Inhale 2 puffs into the lungs every 6 (six) hours as needed for wheezing or shortness of breath. 8 g 2   amLODipine  (NORVASC ) 5 MG tablet Take 1 tablet (5 mg total) by mouth daily. 90 tablet 1   atorvastatin  (LIPITOR) 40 MG tablet Take 1 tablet (40 mg total) by mouth daily. 90 tablet 1   cetirizine  (ZYRTEC ) 10 MG tablet Take 1 tablet (10 mg total) by mouth daily. 30 tablet 1   fluticasone  (FLONASE ) 50 MCG/ACT nasal spray Place 2 sprays into both nostrils daily. (Patient taking differently: Place 2 sprays into both nostrils daily. PRN) 16 g 6   mirtazapine  (REMERON  SOL-TAB) 15 MG disintegrating tablet TAKE 1 TABLET BY MOUTH EVERYDAY AT BEDTIME 90 tablet 1   mirtazapine  (REMERON  SOL-TAB) 30 MG disintegrating tablet Take 1 tablet (30 mg total) by mouth at bedtime. 90 tablet 1   pantoprazole  (PROTONIX ) 40 MG tablet TAKE 1 TABLET BY MOUTH EVERY DAY 60 tablet 0   No current facility-administered medications for this visit.    ROS   PHYSICAL EXAMINATION: There were no vitals taken for this visit. Physical Exam       I have independently reviewed the above radiology studies  and reviewed the findings with the patient.   Recent Lab Findings: Lab Results  Component Value Date   WBC 4.4 02/07/2024   HGB 12.6 02/07/2024   HCT 37.3 02/07/2024   PLT 368 02/07/2024   GLUCOSE 152 (H) 02/07/2024   CHOL 218 (H) 10/18/2023   TRIG 92 10/18/2023   HDL 54 10/18/2023   LDLCALC 148 (H) 10/18/2023   ALT 15 02/07/2024   AST 17 02/07/2024   NA 133 (L) 02/07/2024   K 4.2 02/07/2024   CL 101 02/07/2024   CREATININE 0.68  02/07/2024   BUN 11 02/07/2024  CO2 25 02/07/2024   TSH 1.280 05/19/2023   HGBA1C 5.4 (A) 10/18/2023    Diagnostic Studies & Laboratory data:     Recent Radiology Findings:   No results found.   PFTs:  - FVC: ***% - FEV1: ***% -DLCO: 59%    FINAL MICROSCOPIC DIAGNOSIS:  A. LUNG, RUL, FINE NEEDLE ASPIRATION:  - Adenocarcinoma   Assessment / Plan:   68 y.o. female with *** Marginal lung function  I  spent {CHL ONC TIME VISIT - DTPQU:[id number]} with  the patient face to face in counseling and coordination of care.    Linnie MALVA Rayas 02/17/2024 2:31 PM

## 2024-02-17 NOTE — H&P (View-Only) (Signed)
 [address] Zone [place] [address]             [phone]                   Ota [facility]bersole [facility] Medical Record #[id number] Date of Birth: Jun 01, 1955  Referring: Sherrod Sherrod, MD Primary Care: Delbert Clam, MD Primary Cardiologist: None  Chief Complaint:    Chief Complaint  Patient presents with   Lung Cancer    New patient consultation, review all studies    History of Present Illness:    Tasha Nguyen is a 68 y.o. 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  She is a previous smoker and was involved in the lung cancer screening program.     Zubrod Score: At the time of surgery this patients most appropriate activity status/level should be described as: [x]     0    Normal activity, no symptoms []     1    Restricted in physical strenuous activity but ambulatory, able to do out light work []     2    Ambulatory and capable of self care, unable to do work activities, up and about               >50 % of waking hours                              []     3    Only limited self care, in bed greater than 50% of waking hours []     4    Completely disabled, no self care, confined to bed or chair []     5    Moribund     Past Medical History:  Diagnosis Date   Colon polyps    Dyspnea    occasional with exertion - has inhalers   GERD (gastroesophageal reflux disease)    on protonix    History of kidney stones    passed stone   HLD (hyperlipidemia)    on lipitor   Hypertension    on amlodipine    Lung nodules    right lung   Pneumonia    x 1 as a child   Pre-diabetes 10/18/2023   Seasonal allergies    on zyrtec     Past Surgical History:  Procedure Laterality Date   COLONOSCOPY  2021   hx polyps - repeat q82yrs   MULTIPLE TOOTH EXTRACTIONS     dentures   VIDEO BRONCHOSCOPY WITH ENDOBRONCHIAL NAVIGATION Right 01/17/2024   Procedure: VIDEO BRONCHOSCOPY WITH ENDOBRONCHIAL NAVIGATION;   Surgeon: Shelah Lamar RAMAN, MD;  Location: [facility] ENDOSCOPY;  Service: Pulmonary;  Laterality: Right;    Family History  Problem Relation Age of Onset   Diabetes Mother    Ovarian cancer Mother 55   CAD Father    CAD Sister    Diabetes Sister    Diabetes Sister    Breast cancer Cousin 22   Colon cancer Neg Hx    Esophageal cancer Neg Hx    Stomach cancer Neg Hx    Pancreatic cancer Neg Hx    Liver disease Neg Hx    Rectal cancer Neg Hx    BRCA 1/2 Neg Hx      Social History   Tobacco Use  Smoking Status Former   Current packs/day: 0.00   Average packs/day: 1 pack/day  for 26.1 years (26.1 ttl pk-yrs)   Types: Cigarettes   Start date: 04/18/1987   Quit date: 05/12/2013   Years since quitting: 10.7  Smokeless Tobacco Never    Social History   Substance and Sexual Activity  Alcohol Use Yes   Alcohol/week: 10.0 standard drinks of alcohol   Types: 10 Standard drinks or equivalent per week   Comment: weekends-beers/ wine     Allergies  Allergen Reactions   Hydrocodone-Acetaminophen      Other reaction(s): Other (See Comments) hallicinations   Oxycodone Other (See Comments)    Hallucinations Other reaction(s): Other (See Comments) Hallucinations   Oxycodone-Acetaminophen      Other reaction(s): Other (See Comments) hallucinations    Current Outpatient Medications  Medication Sig Dispense Refill   albuterol  (VENTOLIN  HFA) 108 (90 Base) MCG/ACT inhaler Inhale 2 puffs into the lungs every 6 (six) hours as needed for wheezing or shortness of breath. 8 g 2   amLODipine  (NORVASC ) 5 MG tablet Take 1 tablet (5 mg total) by mouth daily. 90 tablet 1   atorvastatin  (LIPITOR) 40 MG tablet Take 1 tablet (40 mg total) by mouth daily. 90 tablet 1   cetirizine  (ZYRTEC ) 10 MG tablet Take 1 tablet (10 mg total) by mouth daily. 30 tablet 1   fluticasone  (FLONASE ) 50 MCG/ACT nasal spray Place 2 sprays into both nostrils daily. (Patient taking differently: Place 2 sprays into both nostrils  daily. PRN) 16 g 6   mirtazapine  (REMERON  SOL-TAB) 15 MG disintegrating tablet TAKE 1 TABLET BY MOUTH EVERYDAY AT BEDTIME 90 tablet 1   mirtazapine  (REMERON  SOL-TAB) 30 MG disintegrating tablet Take 1 tablet (30 mg total) by mouth at bedtime. 90 tablet 1   pantoprazole  (PROTONIX ) 40 MG tablet TAKE 1 TABLET BY MOUTH EVERY DAY 60 tablet 0   No current facility-administered medications for this visit.    Review of Systems  Constitutional: Negative.   Respiratory: Negative.    Cardiovascular: Negative.   Neurological: Negative.      PHYSICAL EXAMINATION: BP (!) 153/93 (BP Location: Left Arm, Patient Position: Sitting, Cuff Size: Normal)   Pulse 88   Resp 20   Ht 5' 9 (1.753 m)   Wt 120 lb 14.4 oz (54.8 kg)   SpO2 95% Comment: RA  BMI 17.85 kg/m  Physical Exam Constitutional:      General: She is not in acute distress.    Appearance: She is not ill-appearing.  Eyes:     Extraocular Movements: Extraocular movements intact.  Cardiovascular:     Rate and Rhythm: Normal rate.  Musculoskeletal:        General: Normal range of motion.     Cervical back: Normal range of motion.  Skin:    General: Skin is warm and dry.  Neurological:     General: No focal deficit present.     Mental Status: She is alert and oriented to person, place, and time.          I have independently reviewed the above radiology studies  and reviewed the findings with the patient.   Recent Lab Findings: Lab Results  Component Value Date   WBC 4.4 02/07/2024   HGB 12.6 02/07/2024   HCT 37.3 02/07/2024   PLT 368 02/07/2024   GLUCOSE 152 (H) 02/07/2024   CHOL 218 (H) 10/18/2023   TRIG 92 10/18/2023   HDL 54 10/18/2023   LDLCALC 148 (H) 10/18/2023   ALT 15 02/07/2024   AST 17 02/07/2024   NA 133 (L) 02/07/2024  K 4.2 02/07/2024   CL 101 02/07/2024   CREATININE 0.68 02/07/2024   BUN 11 02/07/2024   CO2 25 02/07/2024   TSH 1.280 05/19/2023   HGBA1C 5.4 (A) 10/18/2023    Diagnostic  Studies & Laboratory data:     Recent Radiology Findings:   No results found.   PFTs:  - FVC: 69% - FEV1: 47% -DLCO: 59%    FINAL MICROSCOPIC DIAGNOSIS:  A. LUNG, RUL, FINE NEEDLE ASPIRATION:  - Adenocarcinoma   Assessment / Plan:   68 y.o. female with a right upper lobe adenocarcinoma.  Her lung functions are marginal for a lobectomy, but given the size and location, I think that she would be able to tolerate a wedge resection.  She will need marking prior to surgery.  I will coordinate this Dr. Shelah.    I  spent 40 minutes with  the patient face to face in counseling and coordination of care.    Tasha Nguyen 02/18/2024 12:10 PM

## 2024-02-18 ENCOUNTER — Ambulatory Visit
Attending: Thoracic Surgery (Cardiothoracic Vascular Surgery) | Admitting: Thoracic Surgery (Cardiothoracic Vascular Surgery)

## 2024-02-18 ENCOUNTER — Encounter: Payer: Self-pay | Admitting: Thoracic Surgery (Cardiothoracic Vascular Surgery)

## 2024-02-18 ENCOUNTER — Encounter: Payer: Self-pay | Admitting: *Deleted

## 2024-02-18 ENCOUNTER — Other Ambulatory Visit: Payer: Self-pay | Admitting: *Deleted

## 2024-02-18 VITALS — BP 153/93 | HR 88 | Resp 20 | Ht 69.0 in | Wt 120.9 lb

## 2024-02-18 DIAGNOSIS — C3491 Malignant neoplasm of unspecified part of right bronchus or lung: Secondary | ICD-10-CM

## 2024-02-18 DIAGNOSIS — R911 Solitary pulmonary nodule: Secondary | ICD-10-CM | POA: Diagnosis not present

## 2024-02-22 ENCOUNTER — Other Ambulatory Visit: Payer: Self-pay | Admitting: Thoracic Surgery (Cardiothoracic Vascular Surgery)

## 2024-02-22 DIAGNOSIS — C3491 Malignant neoplasm of unspecified part of right bronchus or lung: Secondary | ICD-10-CM

## 2024-02-22 DIAGNOSIS — R911 Solitary pulmonary nodule: Secondary | ICD-10-CM

## 2024-02-24 ENCOUNTER — Ambulatory Visit (HOSPITAL_COMMUNITY)
Admission: RE | Admit: 2024-02-24 | Discharge: 2024-02-24 | Disposition: A | Discharge status: Home / Self Care | Source: Ambulatory Visit | Attending: Thoracic Surgery (Cardiothoracic Vascular Surgery) | Admitting: Thoracic Surgery (Cardiothoracic Vascular Surgery)

## 2024-02-24 DIAGNOSIS — R911 Solitary pulmonary nodule: Secondary | ICD-10-CM | POA: Insufficient documentation

## 2024-02-24 DIAGNOSIS — C3491 Malignant neoplasm of unspecified part of right bronchus or lung: Secondary | ICD-10-CM | POA: Diagnosis present

## 2024-03-08 NOTE — Progress Notes (Signed)
 Pt had called and left 2 VMs for NN regarding back pain she was having. NN reached out to Dr Sherrod via [facility]. Per Dr Sherrod, if pain is chronic, she should reach out to her PCP if she feels she needs a Rx for pain medication. Pt states she took a Tylenol  PM last night and that helped. NN states that if that tylenol  worked for her, she can get some OTC and not to exceed the maximum daily dosage of 4g/day.  NN returned pts phone call and told her that per Dr Sherrod, pts pain is unlikely caused by her cancer as she is stage I. NN told pt that since her pain is chronic, she needs to reach out to her PCM. Pt corrected NN and states that she does not have chronic back pain. NN reached out to Dr Sherrod again via [facility], no additional guidance provided.  NN did insist that she reach out to her PCP today. Pt verbalized understanding.

## 2024-03-13 NOTE — Progress Notes (Signed)
 PCP - Dr Corrina Sabin Cardiologist - none Oncology - Dr Sherrod Sherrod  Chest x-ray - 03/15/24 EKG - 03/15/24 Stress Test - 02/18/24 ECHO - 06/08/08 Cardiac Cath - n/a  ICD Pacemaker/Loop - n/a  Sleep Study -  n/a  Pre-Diabetes - no meds, does not check blood sugar    Aspirin & Blood Thinner Instructions:  n/a  NPO   Anesthesia review: Yes  STOP now taking any Aspirin (unless otherwise instructed by your surgeon), Aleve , Naproxen , Ibuprofen , Motrin , Advil , Goody's, BC's, all herbal medications, fish oil, and all vitamins.   Coronavirus Screening Do you have any of the following symptoms:  Cough occasional Fever (>100.77F)  yes/no: No Runny nose yes/no: No Sore throat yes/no: No Difficulty breathing/shortness of breath  yes/no: No  Have you traveled in the last 14 days and where? yes/no: No  Patient verbalized understanding of instructions that were given to them at the PAT appointment. Patient was also instructed that they will need to review over the PAT instructions again at home before surgery.

## 2024-03-13 NOTE — Progress Notes (Signed)
 Surgical Instructions   Your procedure is scheduled on Thursday March 16, 2024. Report to [facility] Main Entrance A at 5:30 A.M., then check in with the Admitting office. Any questions or running late day of surgery: call [phone]  Questions prior to your surgery date: call [phone], Monday-Friday, 8am-4pm. If you experience any cold or flu symptoms such as cough, fever, chills, shortness of breath, etc. between now and your scheduled surgery, please notify us  at the above number.     Remember:  Do not eat or drink after midnight the night before your surgery  Take these medicines the morning of surgery with A SIP OF WATER  acetaminophen  (TYLENOL )  amLODipine  (NORVASC )  atorvastatin  (LIPITOR)  pantoprazole  (PROTONIX )   May take these medicines IF NEEDED: albuterol  (VENTOLIN  HFA) 108 (90 Base) MCG/ACT inhaler   One week prior to surgery, STOP taking any Aspirin (unless otherwise instructed by your surgeon) Aleve , Naproxen , Ibuprofen , Motrin , Advil , Goody's, BC's, all herbal medications, fish oil, and non-prescription vitamins.                     Do NOT Smoke (Tobacco/Vaping) for 24 hours prior to your procedure.  If you use a CPAP at night, you may bring your mask/headgear for your overnight stay.   You will be asked to remove any contacts, glasses, piercing's, hearing aid's, dentures/partials prior to surgery. Please bring cases for these items if needed.    Patients discharged the day of surgery will not be allowed to drive home, and someone needs to stay with them for 24 hours.  SURGICAL WAITING ROOM VISITATION Patients may have no more than 2 support people in the waiting area - these visitors may rotate.   Pre-op nurse will coordinate an appropriate time for 1 ADULT support person, who may not rotate, to accompany patient in pre-op.  Children under the age of 69 must have an adult with them who is not the patient and must remain in the main waiting area with an  adult.  If the patient needs to stay at the hospital during part of their recovery, the visitor guidelines for inpatient rooms apply.  Please refer to the [facility] website for the visitor guidelines for any additional information.   If you received a COVID test during your pre-op visit  it is requested that you wear a mask when out in public, stay away from anyone that may not be feeling well and notify your surgeon if you develop symptoms. If you have been in contact with anyone that has tested positive in the last 10 days please notify you surgeon.      Pre-operative CHG Bathing Instructions   You can play a key role in reducing the risk of infection after surgery. Your skin needs to be as free of germs as possible. You can reduce the number of germs on your skin by washing with CHG (chlorhexidine  gluconate) soap before surgery. CHG is an antiseptic soap that kills germs and continues to kill germs even after washing.   DO NOT use if you have an allergy to chlorhexidine /CHG or antibacterial soaps. If your skin becomes reddened or irritated, stop using the CHG and notify one of our RNs at [phone].              TAKE A SHOWER THE NIGHT BEFORE SURGERY   Please keep in mind the following:  DO NOT shave, including legs and underarms, 48 hours prior to surgery.   Place clean  sheets on your bed the night before surgery Use a clean washcloth (not used since being washed) for shower. DO NOT sleep with pet's night before surgery.  CHG Shower Instructions:  Wash your face and private area with normal soap. If you choose to wash your hair, wash first with your normal shampoo.  After you use shampoo/soap, rinse your hair and body thoroughly to remove shampoo/soap residue.  Turn the water OFF and apply half the bottle of CHG soap to a CLEAN washcloth.  Apply CHG soap ONLY FROM YOUR NECK DOWN TO YOUR TOES (washing for 3-5 minutes)  DO NOT use CHG soap on face, private areas, open wounds, or  sores.  Pay special attention to the area where your surgery is being performed.  If you are having back surgery, having someone wash your back for you may be helpful. Wait 2 minutes after CHG soap is applied, then you may rinse off the CHG soap.  Pat dry with a clean towel  Put on clean pajamas    Additional instructions for the day of surgery: If you choose, you may shower the morning of surgery with an antibacterial soap.  DO NOT APPLY any lotions, deodorants or perfumes.   Do not wear jewelry or makeup Do not wear nail polish, gel polish, artificial nails, or any other type of covering on natural nails (fingers and toes) Do not bring valuables to the hospital. [facility] is not responsible for valuables/personal belongings. Put on clean/comfortable clothes.  Please brush your teeth.  Ask your nurse before applying any prescription medications to the skin.

## 2024-03-14 ENCOUNTER — Encounter (HOSPITAL_COMMUNITY): Payer: Self-pay

## 2024-03-14 ENCOUNTER — Other Ambulatory Visit: Payer: Self-pay

## 2024-03-14 ENCOUNTER — Inpatient Hospital Stay (HOSPITAL_COMMUNITY)
Admission: RE | Admit: 2024-03-14 | Discharge: 2024-03-14 | Disposition: A | Source: Ambulatory Visit | Attending: Thoracic Surgery (Cardiothoracic Vascular Surgery)

## 2024-03-14 ENCOUNTER — Ambulatory Visit (HOSPITAL_COMMUNITY)
Admission: RE | Admit: 2024-03-14 | Discharge: 2024-03-14 | Disposition: A | Discharge status: Home / Self Care | Source: Ambulatory Visit | Attending: Thoracic Surgery (Cardiothoracic Vascular Surgery) | Admitting: Thoracic Surgery (Cardiothoracic Vascular Surgery)

## 2024-03-14 VITALS — BP 129/86 | HR 88 | Temp 98.5°F | Resp 18 | Ht 69.0 in | Wt 122.3 lb

## 2024-03-14 DIAGNOSIS — Z01818 Encounter for other preprocedural examination: Secondary | ICD-10-CM

## 2024-03-14 DIAGNOSIS — R911 Solitary pulmonary nodule: Secondary | ICD-10-CM

## 2024-03-14 DIAGNOSIS — C3491 Malignant neoplasm of unspecified part of right bronchus or lung: Secondary | ICD-10-CM

## 2024-03-14 LAB — COMPREHENSIVE METABOLIC PANEL WITH GFR
ALT: 34 U/L (ref 0–44)
AST: 27 U/L (ref 15–41)
Albumin: 3.8 g/dL (ref 3.5–5.0)
Alkaline Phosphatase: 74 U/L (ref 38–126)
Anion gap: 6 (ref 5–15)
BUN: 11 mg/dL (ref 8–23)
CO2: 30 mmol/L (ref 22–32)
Calcium: 9.3 mg/dL (ref 8.9–10.3)
Chloride: 105 mmol/L (ref 98–111)
Creatinine, Ser: 0.6 mg/dL (ref 0.44–1.00)
GFR, Estimated: >60 mL/min (ref >60)
Glucose, Bld: 89 mg/dL (ref 70–99)
Potassium: 4.2 mmol/L (ref 3.5–5.1)
Sodium: 141 mmol/L (ref 135–145)
Total Bilirubin: 0.6 mg/dL (ref 0.0–1.2)
Total Protein: 7.2 g/dL (ref 6.5–8.1)

## 2024-03-14 LAB — PROTIME-INR
INR: 1 (ref 0.8–1.2)
Prothrombin Time: 14.1 s (ref 11.4–15.2)

## 2024-03-14 LAB — URINALYSIS, ROUTINE W REFLEX MICROSCOPIC
Bilirubin Urine: NEGATIVE
Glucose, UA: NEGATIVE mg/dL
Hgb urine dipstick: NEGATIVE
Ketones, ur: NEGATIVE mg/dL
Leukocytes,Ua: NEGATIVE
Nitrite: NEGATIVE
Protein, ur: NEGATIVE mg/dL
Specific Gravity, Urine: 1.02 (ref 1.005–1.030)
pH: 6 (ref 5.0–8.0)

## 2024-03-14 LAB — CBC
HCT: 40.4 % (ref 36.0–46.0)
Hemoglobin: 12.9 g/dL (ref 12.0–15.0)
MCH: 32 pg (ref 26.0–34.0)
MCHC: 31.9 g/dL (ref 30.0–36.0)
MCV: 100.2 fL — ABNORMAL HIGH (ref 80.0–100.0)
Platelets: 327 K/uL (ref 150–400)
RBC: 4.03 MIL/uL (ref 3.87–5.11)
RDW: 13.9 % (ref 11.5–15.5)
WBC: 3.8 K/uL — ABNORMAL LOW (ref 4.0–10.5)
nRBC: 0 % (ref 0.0–0.2)

## 2024-03-14 LAB — TYPE AND SCREEN
ABO/RH(D): AB POS
Antibody Screen: NEGATIVE

## 2024-03-14 LAB — SURGICAL PCR SCREEN
MRSA, PCR: NEGATIVE
Staphylococcus aureus: NEGATIVE

## 2024-03-14 LAB — APTT: aPTT: 31 s (ref 24–36)

## 2024-03-16 ENCOUNTER — Other Ambulatory Visit: Payer: Self-pay

## 2024-03-16 ENCOUNTER — Inpatient Hospital Stay (HOSPITAL_COMMUNITY)

## 2024-03-16 ENCOUNTER — Ambulatory Visit (HOSPITAL_COMMUNITY): Admitting: Physician Assistant

## 2024-03-16 ENCOUNTER — Encounter (HOSPITAL_COMMUNITY)
Admission: RE | Disposition: A | Payer: Self-pay | Source: Home / Self Care | Attending: Thoracic Surgery (Cardiothoracic Vascular Surgery)

## 2024-03-16 ENCOUNTER — Ambulatory Visit (HOSPITAL_COMMUNITY)

## 2024-03-16 ENCOUNTER — Ambulatory Visit (HOSPITAL_COMMUNITY): Admitting: Certified Registered Nurse Anesthetist

## 2024-03-16 ENCOUNTER — Inpatient Hospital Stay (HOSPITAL_COMMUNITY)
Admission: RE | Admit: 2024-03-16 | Discharge: 2024-03-19 | DRG: 164 | Disposition: A | Attending: Thoracic Surgery (Cardiothoracic Vascular Surgery) | Admitting: Thoracic Surgery (Cardiothoracic Vascular Surgery)

## 2024-03-16 ENCOUNTER — Encounter (HOSPITAL_COMMUNITY): Payer: Self-pay | Admitting: Thoracic Surgery (Cardiothoracic Vascular Surgery)

## 2024-03-16 ENCOUNTER — Ambulatory Visit (HOSPITAL_COMMUNITY): Admitting: Anesthesiology

## 2024-03-16 DIAGNOSIS — R911 Solitary pulmonary nodule: Secondary | ICD-10-CM

## 2024-03-16 DIAGNOSIS — Z87891 Personal history of nicotine dependence: Secondary | ICD-10-CM

## 2024-03-16 DIAGNOSIS — C3411 Malignant neoplasm of upper lobe, right bronchus or lung: Secondary | ICD-10-CM | POA: Diagnosis not present

## 2024-03-16 DIAGNOSIS — K219 Gastro-esophageal reflux disease without esophagitis: Secondary | ICD-10-CM | POA: Diagnosis present

## 2024-03-16 DIAGNOSIS — Z860100 Personal history of colon polyps, unspecified: Secondary | ICD-10-CM | POA: Diagnosis not present

## 2024-03-16 DIAGNOSIS — I10 Essential (primary) hypertension: Secondary | ICD-10-CM

## 2024-03-16 DIAGNOSIS — C3491 Malignant neoplasm of unspecified part of right bronchus or lung: Secondary | ICD-10-CM

## 2024-03-16 DIAGNOSIS — Z885 Allergy status to narcotic agent status: Secondary | ICD-10-CM | POA: Diagnosis not present

## 2024-03-16 DIAGNOSIS — Z803 Family history of malignant neoplasm of breast: Secondary | ICD-10-CM | POA: Diagnosis not present

## 2024-03-16 DIAGNOSIS — Z833 Family history of diabetes mellitus: Secondary | ICD-10-CM | POA: Diagnosis not present

## 2024-03-16 DIAGNOSIS — Z9889 Other specified postprocedural states: Principal | ICD-10-CM

## 2024-03-16 DIAGNOSIS — C3490 Malignant neoplasm of unspecified part of unspecified bronchus or lung: Secondary | ICD-10-CM

## 2024-03-16 DIAGNOSIS — J449 Chronic obstructive pulmonary disease, unspecified: Secondary | ICD-10-CM

## 2024-03-16 DIAGNOSIS — Z8249 Family history of ischemic heart disease and other diseases of the circulatory system: Secondary | ICD-10-CM | POA: Diagnosis not present

## 2024-03-16 DIAGNOSIS — E785 Hyperlipidemia, unspecified: Secondary | ICD-10-CM | POA: Diagnosis present

## 2024-03-16 DIAGNOSIS — D62 Acute posthemorrhagic anemia: Secondary | ICD-10-CM | POA: Diagnosis not present

## 2024-03-16 DIAGNOSIS — Z79899 Other long term (current) drug therapy: Secondary | ICD-10-CM | POA: Diagnosis not present

## 2024-03-16 DIAGNOSIS — R7303 Prediabetes: Secondary | ICD-10-CM | POA: Diagnosis present

## 2024-03-16 DIAGNOSIS — Z8041 Family history of malignant neoplasm of ovary: Secondary | ICD-10-CM | POA: Diagnosis not present

## 2024-03-16 DIAGNOSIS — R918 Other nonspecific abnormal finding of lung field: Secondary | ICD-10-CM | POA: Diagnosis present

## 2024-03-16 HISTORY — PX: WEDGE RESECTION, LUNG, ROBOT-ASSISTED, THORACOSCOPIC: SHX7655

## 2024-03-16 HISTORY — PX: INTERCOSTAL NERVE BLOCK: SHX5021

## 2024-03-16 HISTORY — PX: VIDEO BRONCHOSCOPY WITH ENDOBRONCHIAL NAVIGATION: SHX6175

## 2024-03-16 HISTORY — PX: LYMPH NODE BIOPSY: SHX201

## 2024-03-16 LAB — GLUCOSE, CAPILLARY: Glucose-Capillary: 148 mg/dL — ABNORMAL HIGH (ref 70–99)

## 2024-03-16 LAB — ABO/RH: ABO/RH(D): AB POS

## 2024-03-16 SURGERY — WEDGE RESECTION, LUNG, ROBOT-ASSISTED, THORACOSCOPIC
Anesthesia: General | Site: Chest | Laterality: Right

## 2024-03-16 SURGERY — VIDEO BRONCHOSCOPY WITH ENDOBRONCHIAL NAVIGATION
Anesthesia: General

## 2024-03-16 MED ORDER — METHYLENE BLUE 20 MG/2ML IV SOSY
PREFILLED_SYRINGE | INTRAVENOUS | Status: AC
  Filled 2024-03-16: qty 2

## 2024-03-16 MED ORDER — PROPOFOL 10 MG/ML IV BOLUS
INTRAVENOUS | Status: DC | PRN
  Administered 2024-03-16: 100 mg via INTRAVENOUS
  Administered 2024-03-16: 20 mg via INTRAVENOUS

## 2024-03-16 MED ORDER — AMLODIPINE BESYLATE 5 MG PO TABS
5.0000 mg | ORAL_TABLET | Freq: Every day | ORAL | Status: DC
  Administered 2024-03-17 – 2024-03-19 (×3): 5 mg via ORAL
  Filled 2024-03-16 (×3): qty 1

## 2024-03-16 MED ORDER — DEXAMETHASONE SOD PHOSPHATE PF 10 MG/ML IJ SOLN
INTRAMUSCULAR | Status: DC | PRN
  Administered 2024-03-16: 10 mg via INTRAVENOUS

## 2024-03-16 MED ORDER — ROCURONIUM BROMIDE 10 MG/ML (PF) SYRINGE
PREFILLED_SYRINGE | INTRAVENOUS | Status: AC
  Filled 2024-03-16: qty 10

## 2024-03-16 MED ORDER — LACTATED RINGERS IV SOLN: INTRAVENOUS | Status: DC | PRN

## 2024-03-16 MED ORDER — ENOXAPARIN SODIUM 40 MG/0.4ML IJ SOSY
40.0000 mg | PREFILLED_SYRINGE | Freq: Every day | INTRAMUSCULAR | Status: DC
  Administered 2024-03-16 – 2024-03-19 (×4): 40 mg via SUBCUTANEOUS
  Filled 2024-03-16 (×4): qty 0.4

## 2024-03-16 MED ORDER — METHYLENE BLUE 20 MG/2ML IV SOSY
PREFILLED_SYRINGE | INTRAVENOUS | Status: DC | PRN
  Administered 2024-03-16: 1 mL

## 2024-03-16 MED ORDER — SUGAMMADEX SODIUM 200 MG/2ML IV SOLN
INTRAVENOUS | Status: DC | PRN
  Administered 2024-03-16: 200 mg via INTRAVENOUS

## 2024-03-16 MED ORDER — ONDANSETRON HCL 4 MG/2ML IJ SOLN
INTRAMUSCULAR | Status: AC
  Filled 2024-03-16: qty 2

## 2024-03-16 MED ORDER — PANTOPRAZOLE SODIUM 40 MG PO TBEC
40.0000 mg | DELAYED_RELEASE_TABLET | Freq: Every day | ORAL | Status: DC
  Administered 2024-03-17 – 2024-03-19 (×3): 40 mg via ORAL
  Filled 2024-03-16 (×3): qty 1

## 2024-03-16 MED ORDER — MIDAZOLAM HCL (PF) 2 MG/2ML IJ SOLN
INTRAMUSCULAR | Status: DC | PRN
  Administered 2024-03-16: 2 mg via INTRAVENOUS

## 2024-03-16 MED ORDER — CHLORHEXIDINE GLUCONATE 0.12 % MT SOLN
OROMUCOSAL | Status: AC
  Administered 2024-03-16: 15 mL via OROMUCOSAL
  Filled 2024-03-16: qty 15

## 2024-03-16 MED ORDER — KETOROLAC TROMETHAMINE 15 MG/ML IJ SOLN
15.0000 mg | Freq: Four times a day (QID) | INTRAMUSCULAR | Status: AC
  Administered 2024-03-16 – 2024-03-17 (×5): 15 mg via INTRAVENOUS
  Filled 2024-03-16 (×6): qty 1

## 2024-03-16 MED ORDER — ALBUTEROL SULFATE HFA 108 (90 BASE) MCG/ACT IN AERS: 2.0000 | INHALATION_SPRAY | Freq: Four times a day (QID) | RESPIRATORY_TRACT | Status: DC | PRN

## 2024-03-16 MED ORDER — PROPOFOL 500 MG/50ML IV EMUL
INTRAVENOUS | Status: DC | PRN
  Administered 2024-03-16: 75 ug/kg/min via INTRAVENOUS

## 2024-03-16 MED ORDER — CHLORHEXIDINE GLUCONATE 0.12 % MT SOLN: 15.0000 mL | Freq: Once | OROMUCOSAL | Status: AC

## 2024-03-16 MED ORDER — PHENYLEPHRINE HCL-NACL 20-0.9 MG/250ML-% IV SOLN
INTRAVENOUS | Status: DC | PRN
  Administered 2024-03-16: 55 ug/min via INTRAVENOUS

## 2024-03-16 MED ORDER — ORAL CARE MOUTH RINSE: 15.0000 mL | Freq: Once | OROMUCOSAL | Status: AC

## 2024-03-16 MED ORDER — ROCURONIUM BROMIDE 10 MG/ML (PF) SYRINGE
PREFILLED_SYRINGE | INTRAVENOUS | Status: DC | PRN
  Administered 2024-03-16: 40 mg via INTRAVENOUS
  Administered 2024-03-16: 60 mg via INTRAVENOUS

## 2024-03-16 MED ORDER — ALBUTEROL SULFATE (2.5 MG/3ML) 0.083% IN NEBU: 2.5000 mg | INHALATION_SOLUTION | RESPIRATORY_TRACT | Status: DC | PRN

## 2024-03-16 MED ORDER — LIDOCAINE 2% (20 MG/ML) 5 ML SYRINGE
INTRAMUSCULAR | Status: AC
  Filled 2024-03-16: qty 5

## 2024-03-16 MED ORDER — ATORVASTATIN CALCIUM 40 MG PO TABS
40.0000 mg | ORAL_TABLET | Freq: Every day | ORAL | Status: DC
  Administered 2024-03-17 – 2024-03-19 (×3): 40 mg via ORAL
  Filled 2024-03-16 (×3): qty 1

## 2024-03-16 MED ORDER — CEFAZOLIN SODIUM-DEXTROSE 2-4 GM/100ML-% IV SOLN
2.0000 g | INTRAVENOUS | Status: AC
  Administered 2024-03-16: 2 g via INTRAVENOUS
  Filled 2024-03-16: qty 100

## 2024-03-16 MED ORDER — SODIUM CHLORIDE (PF) 0.9 % IJ SOLN
INTRAMUSCULAR | Status: AC
  Filled 2024-03-16: qty 50

## 2024-03-16 MED ORDER — MEPERIDINE HCL 25 MG/ML IJ SOLN: 6.2500 mg | INTRAMUSCULAR | Status: DC | PRN

## 2024-03-16 MED ORDER — BUPIVACAINE HCL (PF) 0.5 % IJ SOLN
INTRAMUSCULAR | Status: AC
  Filled 2024-03-16: qty 30

## 2024-03-16 MED ORDER — CEFAZOLIN SODIUM-DEXTROSE 2-4 GM/100ML-% IV SOLN
2.0000 g | Freq: Three times a day (TID) | INTRAVENOUS | Status: AC
  Administered 2024-03-16 – 2024-03-17 (×2): 2 g via INTRAVENOUS
  Filled 2024-03-16 (×2): qty 100

## 2024-03-16 MED ORDER — ACETAMINOPHEN 160 MG/5ML PO SOLN: 1000.0000 mg | Freq: Four times a day (QID) | ORAL | Status: DC

## 2024-03-16 MED ORDER — HYDROMORPHONE HCL 1 MG/ML IJ SOLN
INTRAMUSCULAR | Status: DC | PRN
  Administered 2024-03-16: .5 mg via INTRAVENOUS

## 2024-03-16 MED ORDER — ACETAMINOPHEN 500 MG PO TABS: 1000.0000 mg | ORAL_TABLET | Freq: Once | ORAL | Status: DC

## 2024-03-16 MED ORDER — INDOCYANINE GREEN 25 MG IJ SOLR
INTRAMUSCULAR | Status: AC
  Filled 2024-03-16: qty 10

## 2024-03-16 MED ORDER — PHENYLEPHRINE 80 MCG/ML (10ML) SYRINGE FOR IV PUSH (FOR BLOOD PRESSURE SUPPORT)
PREFILLED_SYRINGE | INTRAVENOUS | Status: DC | PRN
  Administered 2024-03-16: 160 ug via INTRAVENOUS
  Administered 2024-03-16: 80 ug via INTRAVENOUS
  Administered 2024-03-16: 160 ug via INTRAVENOUS

## 2024-03-16 MED ORDER — ONDANSETRON HCL 4 MG/2ML IJ SOLN
INTRAMUSCULAR | Status: DC | PRN
  Administered 2024-03-16: 4 mg via INTRAVENOUS

## 2024-03-16 MED ORDER — LIDOCAINE 2% (20 MG/ML) 5 ML SYRINGE
INTRAMUSCULAR | Status: DC | PRN
  Administered 2024-03-16: 20 mg via INTRAVENOUS

## 2024-03-16 MED ORDER — BUPIVACAINE LIPOSOME 1.3 % IJ SUSP
INTRAMUSCULAR | Status: AC
  Filled 2024-03-16: qty 20

## 2024-03-16 MED ORDER — LACTATED RINGERS IV SOLN: INTRAVENOUS | Status: DC

## 2024-03-16 MED ORDER — FENTANYL CITRATE (PF) 100 MCG/2ML IJ SOLN
INTRAMUSCULAR | Status: AC
  Filled 2024-03-16: qty 2

## 2024-03-16 MED ORDER — ACETAMINOPHEN 500 MG PO TABS
1000.0000 mg | ORAL_TABLET | Freq: Four times a day (QID) | ORAL | Status: DC
  Administered 2024-03-16 – 2024-03-18 (×8): 1000 mg via ORAL
  Filled 2024-03-16 (×9): qty 2

## 2024-03-16 MED ORDER — MIDAZOLAM HCL 2 MG/2ML IJ SOLN
INTRAMUSCULAR | Status: AC
  Filled 2024-03-16: qty 2

## 2024-03-16 MED ORDER — FENTANYL CITRATE (PF) 250 MCG/5ML IJ SOLN
INTRAMUSCULAR | Status: DC | PRN
  Administered 2024-03-16: 100 ug via INTRAVENOUS

## 2024-03-16 MED ORDER — MIDAZOLAM HCL (PF) 2 MG/2ML IJ SOLN: 0.5000 mg | Freq: Once | INTRAMUSCULAR | Status: DC | PRN

## 2024-03-16 MED ORDER — SUGAMMADEX SODIUM 200 MG/2ML IV SOLN
INTRAVENOUS | Status: AC
  Filled 2024-03-16: qty 2

## 2024-03-16 MED ORDER — BISACODYL 5 MG PO TBEC
10.0000 mg | DELAYED_RELEASE_TABLET | Freq: Every day | ORAL | Status: DC
  Administered 2024-03-16 – 2024-03-18 (×3): 10 mg via ORAL
  Filled 2024-03-16 (×4): qty 2

## 2024-03-16 MED ORDER — 0.9 % SODIUM CHLORIDE (POUR BTL) OPTIME
TOPICAL | Status: DC | PRN
  Administered 2024-03-16: 2000 mL

## 2024-03-16 MED ORDER — FENTANYL CITRATE (PF) 50 MCG/ML IJ SOSY: 25.0000 ug | PREFILLED_SYRINGE | INTRAMUSCULAR | Status: DC | PRN

## 2024-03-16 MED ORDER — HYDROMORPHONE HCL 1 MG/ML IJ SOLN
INTRAMUSCULAR | Status: AC
  Filled 2024-03-16: qty 0.5

## 2024-03-16 MED ORDER — SODIUM CHLORIDE FLUSH 0.9 % IV SOLN
INTRAVENOUS | Status: DC | PRN
  Administered 2024-03-16: 100 mL

## 2024-03-16 MED ORDER — TRAMADOL HCL 50 MG PO TABS
50.0000 mg | ORAL_TABLET | Freq: Four times a day (QID) | ORAL | Status: DC | PRN
  Administered 2024-03-17: 100 mg via ORAL
  Filled 2024-03-16: qty 2

## 2024-03-16 MED ORDER — SENNOSIDES-DOCUSATE SODIUM 8.6-50 MG PO TABS
1.0000 | ORAL_TABLET | Freq: Every day | ORAL | Status: DC
  Administered 2024-03-16: 1 via ORAL
  Filled 2024-03-16 (×2): qty 1

## 2024-03-16 MED ORDER — ONDANSETRON HCL 4 MG/2ML IJ SOLN
4.0000 mg | Freq: Four times a day (QID) | INTRAMUSCULAR | Status: DC | PRN
  Administered 2024-03-16 – 2024-03-17 (×2): 4 mg via INTRAVENOUS
  Filled 2024-03-16 (×2): qty 2

## 2024-03-16 MED ORDER — PROPOFOL 10 MG/ML IV BOLUS
INTRAVENOUS | Status: AC
  Filled 2024-03-16: qty 20

## 2024-03-16 MED ORDER — HYDROMORPHONE HCL 1 MG/ML IJ SOLN: 0.2500 mg | INTRAMUSCULAR | Status: DC | PRN

## 2024-03-16 SURGICAL SUPPLY — 58 items
CANISTER SUCTION 3000ML PPV (SUCTIONS) ×2 IMPLANT
CANNULA REDUCER 12-8 DVNC XI (CANNULA) ×2 IMPLANT
CATH THORACIC 28FR (CATHETERS) ×1 IMPLANT
CHLORAPREP W/TINT 26 (MISCELLANEOUS) ×1 IMPLANT
CNTNR URN SCR LID CUP LEK RST (MISCELLANEOUS) ×5 IMPLANT
DEFOGGER SCOPE WARM SEASHARP (MISCELLANEOUS) ×1 IMPLANT
DERMABOND ADVANCED .7 DNX12 (GAUZE/BANDAGES/DRESSINGS) ×1 IMPLANT
DRAPE ARM DVNC X/XI (DISPOSABLE) ×4 IMPLANT
DRAPE COLUMN DVNC XI (DISPOSABLE) ×1 IMPLANT
DRAPE CV SPLIT W-CLR ANES SCRN (DRAPES) ×1 IMPLANT
DRAPE HALF SHEET 40X57 (DRAPES) ×1 IMPLANT
DRAPE SURG ORHT 6 SPLT 77X108 (DRAPES) ×1 IMPLANT
ELECT BLADE 6.5 EXT (BLADE) IMPLANT
ELECTRODE REM PT RTRN 9FT ADLT (ELECTROSURGICAL) ×1 IMPLANT
FORCEPS BPLR LNG DVNC XI (INSTRUMENTS) IMPLANT
FORCEPS CADIERE DVNC XI (FORCEP) IMPLANT
GAUZE KITTNER 4X5 RF (MISCELLANEOUS) ×1 IMPLANT
GAUZE SPONGE 4X4 12PLY STRL (GAUZE/BANDAGES/DRESSINGS) ×1 IMPLANT
GAUZE SPONGE 4X4 12PLY STRL LF (GAUZE/BANDAGES/DRESSINGS) IMPLANT
GLOVE BIO SURGEON STRL SZ7.5 (GLOVE) ×2 IMPLANT
GLOVE SURG SS PI 8.0 STRL IVOR (GLOVE) ×1 IMPLANT
GOWN STRL REUS W/ TWL LRG LVL3 (GOWN DISPOSABLE) ×2 IMPLANT
GOWN STRL REUS W/ TWL XL LVL3 (GOWN DISPOSABLE) ×2 IMPLANT
GOWN STRL REUS W/TWL 2XL LVL3 (GOWN DISPOSABLE) ×1 IMPLANT
GRASPER TIP-UP FEN DVNC XI (INSTRUMENTS) IMPLANT
HEMOSTAT SURGICEL 2X14 (HEMOSTASIS) ×2 IMPLANT
KIT BASIN OR (CUSTOM PROCEDURE TRAY) ×1 IMPLANT
KIT TURNOVER KIT B (KITS) ×1 IMPLANT
NDL 22X1.5 STRL (OR ONLY) (MISCELLANEOUS) ×1 IMPLANT
PACK CHEST (CUSTOM PROCEDURE TRAY) ×1 IMPLANT
PAD ARMBOARD POSITIONER FOAM (MISCELLANEOUS) ×5 IMPLANT
PORT ACCESS TROCAR AIRSEAL 12 (TROCAR) ×1 IMPLANT
RELOAD STAPLE 45 3.5 BLU DVNC (STAPLE) IMPLANT
RELOAD STAPLE 45 4.3 GRN DVNC (STAPLE) IMPLANT
SEAL UNIV 5-12 XI (MISCELLANEOUS) ×4 IMPLANT
SET IV EXT TUBING 30 (IV SETS) ×1 IMPLANT
SET TRI-LUMEN FLTR TB AIRSEAL (TUBING) ×1 IMPLANT
SOLN 0.9% NACL POUR BTL 1000ML (IV SOLUTION) ×2 IMPLANT
SOLN STERILE WATER BTL 1000 ML (IV SOLUTION) ×1 IMPLANT
SOLUTION ELECTROSURG ANTI STCK (MISCELLANEOUS) ×1 IMPLANT
STAPLER 45 SUREFORM DVNC (STAPLE) IMPLANT
STOPCOCK 4 WAY LG BORE MALE ST (IV SETS) ×1 IMPLANT
SUT PROLENE 4-0 RB1 .5 CRCL 36 (SUTURE) IMPLANT
SUT SILK 1 MH (SUTURE) ×1 IMPLANT
SUT SILK 2 0 SH (SUTURE) IMPLANT
SUT VIC AB 2-0 CT1 TAPERPNT 27 (SUTURE) ×1 IMPLANT
SUT VIC AB 3-0 SH 27X BRD (SUTURE) ×2 IMPLANT
SUT VICRYL 0 TIES 12 18 (SUTURE) ×1 IMPLANT
SUT VICRYL 0 UR6 27IN ABS (SUTURE) ×2 IMPLANT
SYR 10ML LL (SYRINGE) ×1 IMPLANT
SYR 20ML LL LF (SYRINGE) ×1 IMPLANT
SYR 50ML LL SCALE MARK (SYRINGE) ×1 IMPLANT
SYSTEM RETRIEVAL ANCHOR 8 (MISCELLANEOUS) IMPLANT
SYSTEM SAHARA CHEST DRAIN ATS (WOUND CARE) ×1 IMPLANT
TAPE CLOTH 4X10 WHT NS (GAUZE/BANDAGES/DRESSINGS) ×1 IMPLANT
TOWEL GREEN STERILE (TOWEL DISPOSABLE) ×1 IMPLANT
TRAY FOLEY MTR SLVR 16FR STAT (SET/KITS/TRAYS/PACK) ×1 IMPLANT
TRAY FOLEY W/BAG SLVR 14FR (SET/KITS/TRAYS/PACK) ×1 IMPLANT

## 2024-03-16 NOTE — Brief Op Note (Signed)
 03/16/2024  10:25 AM  PATIENT:  Tasha Nguyen  68 y.o. female  PRE-OPERATIVE DIAGNOSIS:  NSCLC  POST-OPERATIVE DIAGNOSIS:  * No post-op diagnosis entered *  PROCEDURE:  Procedures with comments: RIGHT UPPER LOBE ROBOT-ASSISTED WEDGE RESECTION THORACOSCOPIC (Right) - Right robotic RUL wedge BLOCK, NERVE, INTERCOSTAL (Right) LYMPH NODE BIOPSY (Right)  SURGEON:  Surgeons and Role:    * Lightfoot, Linnie KIDD, MD - Primary  PHYSICIAN ASSISTANT: Treylan Mcclintock PA-C  ASSISTANTS: none   ANESTHESIA:   local, general, and INTERCOSTAL NERVE BLOCK  EBL:  20 ML  BLOOD ADMINISTERED:none  DRAINS: 1 28 F Chest Tube(s) in the RIGHT HEMITHORAX   LOCAL MEDICATIONS USED:  BUPIVICAINE  and OTHER EXPAREL  SPECIMEN:  Source of Specimen:  RIGHT UPPER LOBE WEDGE RESECTIONS AND LN SAMPLES  DISPOSITION OF SPECIMEN:  PATHOLOGY  COUNTS:  YES  TOURNIQUET:  * No tourniquets in log *  DICTATION: .Dragon Dictation  PLAN OF CARE: Admit to inpatient   PATIENT DISPOSITION:  PACU - hemodynamically stable.   Delay start of Pharmacological VTE agent (>24hrs) due to surgical blood loss or risk of bleeding: no  COMPLICATIONS: NO KNOWN

## 2024-03-16 NOTE — Interval H&P Note (Signed)
 History and Physical Interval Note:  03/16/2024 7:24 AM  Tasha Nguyen  has presented today for surgery, with the diagnosis of NSCLC.  The various methods of treatment have been discussed with the patient and family. After consideration of risks, benefits and other options for treatment, the patient has consented to  Procedures with comments: VIDEO BRONCHOSCOPY WITH ENDOBRONCHIAL NAVIGATION (N/A) - Robotic bronch for marking as a surgical intervention.  The patient's history has been reviewed, patient examined, no change in status, stable for surgery.  I have reviewed the patient's chart and labs.  Questions were answered to the patient's satisfaction.     Gagan Dillion MALVA Rayas

## 2024-03-16 NOTE — Anesthesia Procedure Notes (Signed)
 Procedure Name: Intubation Date/Time: 03/16/2024 7:46 AM  Performed by: Mannie Krystal LABOR, CRNAPre-anesthesia Checklist: Patient identified, Emergency Drugs available, Suction available and Patient being monitored Patient Re-evaluated:Patient Re-evaluated prior to induction Oxygen Delivery Method: Circle system utilized Preoxygenation: Pre-oxygenation with 100% oxygen Induction Type: IV induction Ventilation: Mask ventilation without difficulty Grade View: Grade I Tube type: Oral Tube size: 8.5 mm Number of attempts: 1 Airway Equipment and Method: Stylet Placement Confirmation: ETT inserted through vocal cords under direct vision, positive ETCO2 and breath sounds checked- equal and bilateral Secured at: 22 cm Tube secured with: Tape Dental Injury: Teeth and Oropharynx as per pre-operative assessment

## 2024-03-16 NOTE — Discharge Summary (Signed)
 Physician Discharge Summary                [address]               Tasha Nguyen [place] Nguyen, [place] [address]                      [phone]   Patient ID: Tasha Nguyen MRN: [id number] DOB/AGE: April 30, 1955 68 y.o.  Admit date: 03/16/2024 Discharge date: 03/19/2024  Admission Diagnoses:  Patient Active Problem List   Diagnosis Date Noted   Mass of right lung 03/16/2024   Nodule of upper lobe of right lung 01/03/2024   Chronic obstructive pulmonary disease (HCC) 06/21/2023   Alcohol abuse 05/19/2023   Pain in both hands 12/16/2022   Encounter for screening for lung cancer 12/16/2022   Gastroesophageal reflux disease without esophagitis 01/14/2022   Prediabetes 06/25/2021   Right shoulder pain 06/25/2021   Vision changes 04/21/2021   Hammer toe 04/21/2021   History of colonic polyps 02/01/2020   PTSD (post-traumatic stress disorder) 11/23/2019   Generalized anxiety disorder 11/23/2019   Psychophysiological insomnia 11/23/2019   Callus 10/23/2019   Mixed hyperlipidemia 08/16/2008   Essential hypertension 08/16/2008   Discharge Diagnoses:   Patient Active Problem List   Diagnosis Date Noted   S/P Robotic Assisted Right Upper Lobe Wedge Resection 03/16/2024   Mass of right lung 03/16/2024   Nodule of upper lobe of right lung 01/03/2024   Chronic obstructive pulmonary disease (HCC) 06/21/2023   Alcohol abuse 05/19/2023   Pain in both hands 12/16/2022   Encounter for screening for lung cancer 12/16/2022   Gastroesophageal reflux disease without esophagitis 01/14/2022   Prediabetes 06/25/2021   Right shoulder pain 06/25/2021   Vision changes 04/21/2021   Hammer toe 04/21/2021   History of colonic polyps 02/01/2020   PTSD (post-traumatic stress disorder) 11/23/2019   Generalized anxiety disorder 11/23/2019   Psychophysiological insomnia 11/23/2019   Callus 10/23/2019   Mixed hyperlipidemia 08/16/2008   Essential hypertension 08/16/2008    Consults:  None  Procedure (s):  12/11 RIGHT UPPER LOBE ROBOT-ASSISTED WEDGE RESECTION - ROBOTIC INTERCOSTAL NERVE BLOCK - RIGHT LYMPH NODE BIOPSY  History of Present Illness:  Tasha Nguyen is a 68 y.o. 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  She is a previous smoker and was involved in the lung cancer screening program.  She underwent needle aspiration which revealed adenocarcinoma.  Dr. Shyrl evaluated the patient and felt her lung function would be marginal for proceeding with lobectomy but she would be a candidate for wedge resection. He reviewed the patient's treatment options as well as the risks and benefits of surgery with the patient. [name] was agreeable to proceed with surgery.   Hospital course:  [name] presented to [facility] on 03/16/2024 and was electively taken the operating room at which time she underwent a robotic right upper lobe wedge resection with lymph node sampling.  She tolerated the procedure well and was taken to the postanesthesia care unit in stable condition.  The patient had issues with post operative nausea and vomiting. This resolved with use of anti-emetics and she began tolerating a diet. Her chest tube was managed on water seal. There was no evidence of air leak and output was low.  Chest xray showed no evidence of pneumothorax. Her chest tube was removed on POD #1 without difficulty. Follow up chest xray showed no evidence of pneumothorax.  She is stable in NSR. She has history of HTN and her home medications were resumed. Surgical incisions are healing without evidence of infection. She is medically stable for discharge home today.   Latest Vital Signs: Blood pressure 128/83, pulse 78, temperature 98.9 F (37.2 C), temperature source Oral, resp. rate 17, height 5' 9 (1.753 m), weight 55.3 kg, SpO2 94%.  Physical Exam: General appearance: alert, cooperative, and no distress Neurologic: intact Heart:  regular rate and rhythm, S1, S2 normal, no murmur, click, rub or gallop Lungs: clear to auscultation bilaterally Abdomen: soft, non-tender; bowel sounds normal; no masses,  no organomegaly Extremities: extremities normal, atraumatic, no cyanosis or edema Wound: Clean and dry without sign of infection  Discharge Condition: Stable   Recent laboratory studies:  Lab Results  Component Value Date   WBC 7.5 03/18/2024   HGB 12.1 03/18/2024   HCT 36.1 03/18/2024   MCV 94.3 03/18/2024   PLT 270 03/18/2024   Lab Results  Component Value Date   NA 135 03/18/2024   K 3.7 03/18/2024   CL 98 03/18/2024   CO2 28 03/18/2024   CREATININE 0.64 03/18/2024   GLUCOSE 97 03/18/2024      Diagnostic Studies: DG Chest 2 View Result Date: 03/18/2024 EXAM: 2 VIEW(S) XRAY OF THE CHEST 03/18/2024 05:17:16 AM COMPARISON: Portable chest yesterday at 11:38 AM. CLINICAL HISTORY: Pneumothorax. FINDINGS: LUNGS AND PLEURA: Postsurgical changes in the right upper lobe are again noted. There is a minimal layering left pleural effusion. Linear atelectasis in the lateral left base. No other focal pulmonary opacity is identified. There is no measurable pneumothorax. Overall aeration seems unchanged. HEART AND MEDIASTINUM: No acute abnormality of the cardiac and mediastinal silhouettes. BONES AND SOFT TISSUES: Osteopenia. No acute osseous abnormality. IMPRESSION: 1. No measurable pneumothorax. 2. Minimal layering left pleural effusion. 3. Postsurgical changes in the right upper lobe. Electronically signed by: Francis Quam MD 03/18/2024 06:27 AM EST RP Workstation: [id number]   DG Chest 2 View Result Date: 03/17/2024 EXAM: 2 VIEW(S) XRAY OF THE CHEST 03/14/2024 12:27:29 PM COMPARISON: 01/17/2024 CLINICAL HISTORY: Pre-op evaluation FINDINGS: LUNGS AND PLEURA: Fiducial marker/biopsy clip in the right lung apex. Linear atelectasis or scar at left lung base. No pleural effusion. No pneumothorax. HEART AND MEDIASTINUM: No acute  abnormality of the cardiac and mediastinal silhouettes. BONES AND SOFT TISSUES: No acute osseous abnormality. IMPRESSION: 1. Fiducial marker/biopsy clip in the right lung apex. Electronically signed by: Pinkie Pebbles MD 03/17/2024 08:44 PM EST RP Workstation: [id number]   DG Chest 1 View Result Date: 03/17/2024 EXAM: 1 VIEW(S) XRAY OF THE CHEST 03/17/2024 11:47:00 AM COMPARISON: 03/17/2024 CLINICAL HISTORY: Encounter for chest tube removal FINDINGS: LINES, TUBES AND DEVICES: Right chest tube removed. LUNGS AND PLEURA: Suture lines over right upper lung. Chronic left basilar atelectasis/scarring. No pleural effusion. No pneumothorax. HEART AND MEDIASTINUM: No acute abnormality of the cardiac and mediastinal silhouettes. BONES AND SOFT TISSUES: No acute osseous abnormality. IMPRESSION: 1. Right chest tube removal. 2. No pneumothorax. Electronically signed by: Pinkie Pebbles MD 03/17/2024 08:43 PM EST RP Workstation: [id number]   DG Chest Port 1 View Result Date: 03/17/2024 CLINICAL DATA:  Right lung mass.  Chest tube. EXAM: PORTABLE CHEST 1 VIEW COMPARISON:  03/16/2024 FINDINGS: Right-sided chest tube is in similar position. No evidence for pneumothorax. Lungs are hyperexpanded. Mild chronic interstitial coarsening. Staple line overlies the right upper lung. The cardiopericardial silhouette is within normal limits for size. Telemetry leads overlie the chest. IMPRESSION: Right-sided chest tube in similar position without evidence  for pneumothorax. Electronically Signed   By: Camellia Candle M.D.   On: 03/17/2024 07:41   DG Chest Port 1 View Result Date: 03/16/2024 CLINICAL DATA:  Mass right lung. EXAM: PORTABLE CHEST 1 VIEW COMPARISON:  07/14/2023 and CT 02/24/2024 FINDINGS: Interval right upper lobe with resection with suture line over the right upper lobe. Right apical chest tube in place. Lungs are adequately inflated. Subtle patchy density over the right upper lobe likely postsurgical. No effusion or  pneumothorax. Cardiomediastinal silhouette and remainder of the exam is unchanged. IMPRESSION: Interval right upper lobe wedge resection with right apical chest tube in place. No pneumothorax. Electronically Signed   By: Toribio Agreste M.D.   On: 03/16/2024 17:57   DG C-ARM BRONCHOSCOPY Result Date: 03/16/2024 C-ARM BRONCHOSCOPY: Fluoroscopy was utilized by the requesting physician.  No radiographic interpretation.   CT Super D Chest Wo Contrast Result Date: 02/29/2024 EXAM: CT CHEST WITHOUT CONTRAST 02/24/2024 08:24:00 AM TECHNIQUE: CT of the chest was performed without the administration of intravenous contrast. Multiplanar reformatted images are provided for review. Automated exposure control, iterative reconstruction, and/or weight based adjustment of the mA/kV was utilized to reduce the radiation dose to as low as reasonably achievable. COMPARISON: Plain film of 01/17/2024. CT of 12/10/2023. PET of 01/06/2024. CLINICAL HISTORY: Adenocarcinoma on biopsy of right upper lobe mixed attenuation nodule.pre surgical planning. * Tracking Code: BO * FINDINGS: MEDIASTINUM: Normal heart size. Minimal anterior pericardial effusion, likely physiologic. Left main and LAD coronary artery calcification. Normal aortic caliber. Aortic atherosclerosis (ICD10-I70.0). The central airways are clear. LYMPH NODES: No mediastinal adenopathy. Hilar regions poorly evaluated without IV contrast. No axillary lymphadenopathy. LUNGS AND PLEURA: Advanced paraseptal and centrilobular emphysema (ICD10-J43.9). In the right upper lobe, at the site of a mixed attenuation nodule, there is a prominent fiducial, presumably from prior bronchoscopy sampling. The underlying mixed attenuation nodule measures 7 x 6 mm on series 23, image 302, and is similar to on the prior. Bibasilar scarring. A subpleural right lower lobe 4 mm nodule on series 99, image 302, is unchanged. No focal consolidation or pulmonary edema. No pleural effusion or  pneumothorax. SOFT TISSUES/BONES: No acute abnormality of the bones or soft tissues. UPPER ABDOMEN: Limited images of the upper abdomen demonstrate no acute abnormality. Normal adrenal glands. IMPRESSION: 1. Stable 7 x 6 mm mixed attenuation nodule in the posterior right upper lobe with fiducial marker, unchanged. 2. No thoracic adenopathy or evidence of metastatic disease. 3. Incidental findings, including: Aortic atherosclerosis (icd10-i70.0), coronary artery atherosclerosis, and emphysema (icd10-j43.9). Electronically signed by: Rockey Kilts MD 02/29/2024 09:17 AM EST RP Workstation: [id number]    Discharge Medications: Allergies as of 03/19/2024       Reactions   Hydrocodone-acetaminophen     Other reaction(s): Other (See Comments) hallicinations   Oxycodone Other (See Comments)   Hallucinations Other reaction(s): Other (See Comments) Hallucinations   Oxycodone-acetaminophen     Other reaction(s): Other (See Comments) hallucinations        Medication List     TAKE these medications    acetaminophen  500 MG tablet Commonly known as: TYLENOL  Take 500-1,000 mg by mouth every 6 (six) hours as needed (pain.).   albuterol  108 (90 Base) MCG/ACT inhaler Commonly known as: VENTOLIN  HFA Inhale 2 puffs into the lungs every 6 (six) hours as needed for wheezing or shortness of breath.   amLODipine  5 MG tablet Commonly known as: NORVASC  Take 1 tablet (5 mg total) by mouth daily.   atorvastatin  40 MG tablet Commonly known as: LIPITOR Take  1 tablet (40 mg total) by mouth daily.   ondansetron  4 MG tablet Commonly known as: Zofran  Take 1 tablet (4 mg total) by mouth every 8 (eight) hours as needed for nausea or vomiting.   pantoprazole  40 MG tablet Commonly known as: PROTONIX  TAKE 1 TABLET BY MOUTH EVERY DAY   traMADol  50 MG tablet Commonly known as: ULTRAM  Take 1 tablet (50 mg total) by mouth every 6 (six) hours as needed (mild pain).               Durable Medical  Equipment  (From admission, onward)           Start     Ordered   03/18/24 0952  For home use only DME Walker rolling  Once       Question Answer Comment  Walker: With 5 Inch Wheels   Patient needs a walker to treat with the following condition Physical deconditioning   Patient needs a walker to treat with the following condition S/P partial lobectomy of lung      03/18/24 0951            Follow Up Appointments:  Follow-up Information     Shyrl Linnie KIDD, MD Follow up on 04/07/2024.   Specialty: Cardiothoracic Surgery Why: Appointment is at 9:40AM Contact information: [address] [place] [address] [phone]         Delbert Clam, MD. Schedule an appointment as soon as possible for a visit.   Specialty: Family Medicine Why: For hospital follow up Contact information: [address] [place] [address] [place] [address] [phone]         Sherrod Sherrod, MD Follow up on 03/22/2024.   Specialty: Oncology Why: Appointment is at 3:15PM Contact information: [address] [place] [address] [phone]                 Signed: Con GORMAN Bend, PA-C 03/19/2024, 1:28 PM

## 2024-03-16 NOTE — Anesthesia Preprocedure Evaluation (Addendum)
 Anesthesia Evaluation  Patient identified by MRN, date of birth, ID band Patient awake    Reviewed: Allergy & Precautions, NPO status , Patient's Chart, lab work & pertinent test results  History of Anesthesia Complications Negative for: history of anesthetic complications  Airway Mallampati: I  TM Distance: >3 FB Neck ROM: Full    Dental  (+) Edentulous Upper, Edentulous Lower   Pulmonary COPD, former smoker RUL non-small cell   breath sounds clear to auscultation       Cardiovascular hypertension, Pt. on medications (-) angina  Rhythm:Regular Rate:Normal     Neuro/Psych negative neurological ROS     GI/Hepatic ,GERD  Medicated and Controlled,,(+)     substance abuse  alcohol use and marijuana use  Endo/Other  negative endocrine ROS    Renal/GU negative Renal ROS     Musculoskeletal   Abdominal   Peds  Hematology Hb 12.9, plt 327k   Anesthesia Other Findings   Reproductive/Obstetrics                              Anesthesia Physical Anesthesia Plan  ASA: 3  Anesthesia Plan: General   Post-op Pain Management: Tylenol  PO (pre-op)*   Induction: Intravenous  PONV Risk Score and Plan: 3 and Ondansetron , Dexamethasone  and Treatment may vary due to age or medical condition  Airway Management Planned: Oral ETT and Double Lumen EBT  Additional Equipment: ClearSight  Intra-op Plan:   Post-operative Plan: Extubation in OR  Informed Consent: I have reviewed the patients History and Physical, chart, labs and discussed the procedure including the risks, benefits and alternatives for the proposed anesthesia with the patient or authorized representative who has indicated his/her understanding and acceptance.     Dental advisory given  Plan Discussed with: CRNA and Surgeon  Anesthesia Plan Comments: (Plan routine monitors, GETA with ClearSight BP monitoring. ETT exchange fro single  lumen to DLT following bronchoscopy)         Anesthesia Quick Evaluation

## 2024-03-16 NOTE — TOC CM/SW Note (Signed)
 Transition of Care [facility]) - Inpatient Brief Assessment   Patient Details  Name: Tasha Nguyen MRN: [id number] Date of Birth: 1955-06-04  Transition of Care [facility]) CM/SW Contact:    Lauraine FORBES Saa, LCSWA Phone Number: 03/16/2024, 4:15 PM   Clinical Narrative:  4:15 PM Per chart review, patient resides at home alone. Patient has a PCP and insurance. Patient does not have SNF/HH/DME history. Patient's preferred pharmacy is [facility] [id number]. No TOC needs identified at this time. TOC will continue to follow.  Transition of Care Asessment: Insurance and Status: Insurance coverage has been reviewed Patient has primary care physician: Yes Home environment has been reviewed: Private Residence Prior level of function:: N/A Prior/Current Home Services: No current home services Social Drivers of Health Review: SDOH reviewed no interventions necessary Readmission risk has been reviewed: Yes (Currently Green 7%) Transition of care needs: no transition of care needs at this time

## 2024-03-16 NOTE — Hospital Course (Addendum)
 History of Present Illness:  Tasha Nguyen is a 68 y.o. 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  She is a previous smoker and was involved in the lung cancer screening program.  She underwent needle aspiration which revealed adenocarcinoma.  Dr. Shyrl evaluated the patient and felt her lung function would be marginal for proceeding with lobectomy but she would be a candidate for wedge resection. He reviewed the patient's treatment options as well as the risks and benefits of surgery with the patient. Tasha Nguyen was agreeable to proceed with surgery.   Hospital course:  Tasha Nguyen presented to [facility] on 03/16/2024 and was electively taken the operating room at which time she underwent a robotic right upper lobe wedge resection with lymph node sampling.  She tolerated the procedure well and was taken to the postanesthesia care unit in stable condition.  The patient had issues with post operative nausea and vomiting. This resolved with use of anti-emetics and she began tolerating a diet. Her chest tube was managed on water seal. There was no evidence of air leak and output was low.  Chest xray showed no evidence of pneumothorax. Her chest tube was removed on POD #1 without difficulty. Follow up chest xray showed no evidence of pneumothorax. She is stable in NSR. She has history of HTN and her home medications were resumed. Surgical incisions are healing without evidence of infection. She is medically stable for discharge home today.

## 2024-03-16 NOTE — Anesthesia Procedure Notes (Signed)
 Procedure Name: Intubation Date/Time: 03/16/2024 8:54 AM  Performed by: Mannie Krystal LABOR, CRNAPre-anesthesia Checklist: Patient identified, Emergency Drugs available, Suction available and Patient being monitored Patient Re-evaluated:Patient Re-evaluated prior to induction Oxygen Delivery Method: Circle system utilized Preoxygenation: Pre-oxygenation with 100% oxygen Induction Type: IV induction Laryngoscope Size: Mac and 3 Grade View: Grade I Tube type: Oral Endobronchial tube: Double lumen EBT and 37 Fr Number of attempts: 1 Airway Equipment and Method: Stylet Placement Confirmation: ETT inserted through vocal cords under direct vision, positive ETCO2 and breath sounds checked- equal and bilateral Tube secured with: Tape Dental Injury: Teeth and Oropharynx as per pre-operative assessment

## 2024-03-16 NOTE — Op Note (Signed)
° °   °  [address] [place] [address]             [phone]        03/16/2024  Patient:  Tasha Nguyen Pre-Op Dx: Right upper lobe NSCLC   Post-op Dx:  same Procedure: - Robotic assisted right video thoracoscopy - right upper lobe wedge resection - Hilar and mediastinal lymph node sampling - Intercostal nerve block  Surgeon and Role:      * Chalet Kerwin, Linnie KIDD, MD - Primary  Assistant: MICAEL Cera, PA-C  An experienced assistant was required given the complexity of this surgery and the standard of surgical care. The assistant was needed for exposure, dissection, suctioning, retraction of delicate tissues and sutures, instrument exchange and for overall help during this procedure.    Anesthesia  general EBL:  12 ml Blood Administration: none Specimen:  right upper wedge resection, hilar and mediastinal nodes.  Additional margin  Drains: 11 F argyle chest tube in right chest Counts: correct   Indications: 68 y.o. female with a right upper lobe adenocarcinoma.  Her lung functions are marginal for a lobectomy, but given the size and location, I think that she would be able to tolerate a wedge resection.  She will need marking prior to surgery.   Findings: The wedge resection frozen showed NSCLC.  It was close to the staple line.     Operative Technique: After the risks, benefits and alternatives were thoroughly discussed, the patient was brought to the operative theatre.  Anesthesia was induced, and the patient was then placed in a lateral decubitus position and was prepped and draped in normal sterile fashion.  An appropriate surgical pause was performed, and pre-operative antibiotics were dosed accordingly.  We began by placing our 4 robotic ports in the the 7th intercostal space targeting the hilum of the lung.  A 12mm assistant port was placed in the 9th intercostal space in the anterior axillary line.  The robot was then docked and all instruments were  passed under direct visualization.    A wedge resection of the upper lobe was performed.  The frozen path was consistent with NSCLC.  It was close to the staple line, so previous staple line was wedge out as well.  Lymph nodes were sampled from the hilum and mediastinum.  They were negative for metastasis.      The chest was irrigated, and an air leak test was performed.  An intercostal nerve block was performed under direct visualization.  A 28 F chest tube was then placed, and we watch the remaining lobes re-expand.  The skin and soft tissue were closed with absorbable suture    The patient tolerated the procedure without any immediate complications, and was transferred to the PACU in stable condition.  Arrielle Mcginn KIDD Rayas

## 2024-03-16 NOTE — Op Note (Signed)
° °   °  [address] [place] [address]             [phone]                                       03/16/2024 Patient:  Tasha Nguyen Pre-Op Dx: Right upper lobe pulmonary nodule   Post-op Dx:  same Procedure: Flexible bronchoscopy Robotic assisted bronchoscopy with marking of right upper lobe pulmonary nodule   Surgeon and Role:      * Demetris Meinhardt, Linnie KIDD, MD - Primary   Anesthesia  general EBL:  minimal    Indications: 68 y.o. female with a right upper lobe adenocarcinoma.  Her lung functions are marginal for a lobectomy, but given the size and location, I think that she would be able to tolerate a wedge resection.  She will need marking prior to surgery.  Findings: Good localization  Operative Technique: Prior to the date of the procedure a high-resolution CT scan of the chest was performed. Utilizing ION software program a virtual tracheobronchial tree was generated to allow the creation of distinct navigation pathways to the patient's parenchymal abnormalities.  The patient was brought to the endoscopy suite.  After anesthesia was induced, and time out was performed.  The video fiberoptic bronchoscope was introduced via the endotracheal tube and a general inspection was performed which showed normal right and left lung anatomy Aspiration of the bilateral mainstems was completed to remove any remaining secretions. The robotic catheter was then inserted into patient's endotracheal tube.   Target #1 right upper lobe: The distinct navigation pathways prepared prior to this procedure were then utilized to navigate to patient's lesion identified on CT scan. The robotic catheter was secured into place and the vision probe was withdrawn.  Lesion location was approximated using fluoroscopy.  Local registration and targeting was performed using Cios three-dimensional imaging. Under fluoroscopic guidance 1 cc of 1: 1 indocyanine green: Methylene blue was injected  into the nodule using a transbronchial needle.   The patient tolerated the procedure without any immediate complications, and was taken to the operating room for resection of the marked lesion.   Almalik Weissberg KIDD Rayas

## 2024-03-16 NOTE — Transfer of Care (Signed)
 Immediate Anesthesia Transfer of Care Note  Patient: Tasha Nguyen  Procedure(s) Performed: RIGHT UPPER LOBE ROBOT-ASSISTED WEDGE RESECTION THORACOSCOPIC (Right: Chest) BLOCK, NERVE, INTERCOSTAL (Right: Chest) LYMPH NODE BIOPSY (Right: Chest)  Patient Location: PACU  Anesthesia Type:General  Level of Consciousness: awake, alert , and oriented  Airway & Oxygen Therapy: Patient Spontanous Breathing and Patient connected to face mask oxygen  Post-op Assessment: Report given to RN and Post -op Vital signs reviewed and stable  Post vital signs: Reviewed and stable  Last Vitals:  Vitals Value Taken Time  BP 136/84 03/16/24 10:46  Temp    Pulse 78 03/16/24 10:47  Resp 15 03/16/24 10:47  SpO2 99 % 03/16/24 10:47  Vitals shown include unfiled device data.  Last Pain:  Vitals:   03/16/24 0628  TempSrc:   PainSc: 0-No pain         Complications: No notable events documented.

## 2024-03-16 NOTE — Plan of Care (Signed)

## 2024-03-16 NOTE — Anesthesia Postprocedure Evaluation (Signed)
 Anesthesia Post Note  Patient: Berdia Lachman  Procedure(s) Performed: RIGHT UPPER LOBE ROBOT-ASSISTED WEDGE RESECTION THORACOSCOPIC (Right: Chest) BLOCK, NERVE, INTERCOSTAL (Right: Chest) LYMPH NODE BIOPSY (Right: Chest)     Patient location during evaluation: PACU Anesthesia Type: General Level of consciousness: awake and alert, oriented and patient cooperative Pain management: pain level controlled Vital Signs Assessment: post-procedure vital signs reviewed and stable Respiratory status: spontaneous breathing, nonlabored ventilation, respiratory function stable and patient connected to nasal cannula oxygen Cardiovascular status: blood pressure returned to baseline and stable Postop Assessment: no apparent nausea or vomiting Anesthetic complications: no   No notable events documented.  Last Vitals:  Vitals:   03/16/24 1200 03/16/24 1215  BP: 116/77 113/76  Pulse: 72 77  Resp: 11 18  Temp:    SpO2: 97% 98%    Last Pain:  Vitals:   03/16/24 1215  TempSrc:   PainSc: Asleep                 Jaydy Fitzhenry,E. Rosealie Reach

## 2024-03-17 ENCOUNTER — Inpatient Hospital Stay (HOSPITAL_COMMUNITY)

## 2024-03-17 ENCOUNTER — Encounter (HOSPITAL_COMMUNITY): Payer: Self-pay | Admitting: Thoracic Surgery (Cardiothoracic Vascular Surgery)

## 2024-03-17 LAB — BASIC METABOLIC PANEL WITH GFR
Anion gap: 6 (ref 5–15)
BUN: 9 mg/dL (ref 8–23)
CO2: 26 mmol/L (ref 22–32)
Calcium: 8.7 mg/dL — ABNORMAL LOW (ref 8.9–10.3)
Chloride: 101 mmol/L (ref 98–111)
Creatinine, Ser: 0.58 mg/dL (ref 0.44–1.00)
GFR, Estimated: >60 mL/min (ref >60)
Glucose, Bld: 101 mg/dL — ABNORMAL HIGH (ref 70–99)
Potassium: 3.5 mmol/L (ref 3.5–5.1)
Sodium: 133 mmol/L — ABNORMAL LOW (ref 135–145)

## 2024-03-17 LAB — CBC
HCT: 32.6 % — ABNORMAL LOW (ref 36.0–46.0)
Hemoglobin: 10.8 g/dL — ABNORMAL LOW (ref 12.0–15.0)
MCH: 31.5 pg (ref 26.0–34.0)
MCHC: 33.1 g/dL (ref 30.0–36.0)
MCV: 95 fL (ref 80.0–100.0)
Platelets: 260 K/uL (ref 150–400)
RBC: 3.43 MIL/uL — ABNORMAL LOW (ref 3.87–5.11)
RDW: 13.6 % (ref 11.5–15.5)
WBC: 7.8 K/uL (ref 4.0–10.5)
nRBC: 0 % (ref 0.0–0.2)

## 2024-03-17 LAB — SURGICAL PATHOLOGY

## 2024-03-17 MED ORDER — PROCHLORPERAZINE EDISYLATE 10 MG/2ML IJ SOLN: 10.0000 mg | Freq: Four times a day (QID) | INTRAMUSCULAR | Status: DC | PRN

## 2024-03-17 MED ORDER — METOCLOPRAMIDE HCL 5 MG/ML IJ SOLN
5.0000 mg | Freq: Four times a day (QID) | INTRAMUSCULAR | Status: AC
  Administered 2024-03-17: 5 mg via INTRAVENOUS
  Filled 2024-03-17 (×2): qty 2

## 2024-03-17 NOTE — Plan of Care (Signed)
  Problem: Education: Goal: Knowledge of General Education information will improve Description: Including pain rating scale, medication(s)/side effects and non-pharmacologic comfort measures Outcome: Progressing   Problem: Clinical Measurements: Goal: Ability to maintain clinical measurements within normal limits will improve Outcome: Progressing Goal: Diagnostic test results will improve Outcome: Progressing Goal: Respiratory complications will improve Outcome: Progressing Goal: Cardiovascular complication will be avoided Outcome: Progressing   Problem: Activity: Goal: Risk for activity intolerance will decrease Outcome: Progressing   Problem: Nutrition: Goal: Adequate nutrition will be maintained Outcome: Progressing   Problem: Coping: Goal: Level of anxiety will decrease Outcome: Progressing   Problem: Elimination: Goal: Will not experience complications related to bowel motility Outcome: Progressing Goal: Will not experience complications related to urinary retention Outcome: Progressing   Problem: Pain Managment: Goal: General experience of comfort will improve and/or be controlled Outcome: Progressing   Problem: Safety: Goal: Ability to remain free from injury will improve Outcome: Progressing

## 2024-03-17 NOTE — Progress Notes (Signed)
° °   °  [address] [place] [address]             [phone]      CXR is stable post chest tube removal but patient is nauseous and has not ambulated since chest tube removal. Will keep in the hospital for today for further ambulation and make sure she is tolerating a diet prior to discharge home especially since the patient lives alone. Plan for discharge home in the AM.   Con GORMAN Bend, PA-C 03/17/24

## 2024-03-17 NOTE — Progress Notes (Addendum)
 1 Day Post-Op Procedures (LRB): RIGHT UPPER LOBE ROBOT-ASSISTED WEDGE RESECTION THORACOSCOPIC (Right) BLOCK, NERVE, INTERCOSTAL (Right) LYMPH NODE BIOPSY (Right) Subjective: Feels ok, some pain, some nausea yesterday but has resolved  Objective: Vital signs in last 24 hours: Temp:  [97.4 F (36.3 C)-99.4 F (37.4 C)] 98.9 F (37.2 C) (12/12 0300) Pulse Rate:  [68-78] 68 (12/12 0300) Cardiac Rhythm: Normal sinus rhythm (12/11 1900) Resp:  [11-20] 14 (12/12 0300) BP: (91-144)/(63-92) 113/76 (12/12 0300) SpO2:  [93 %-100 %] 93 % (12/12 0300)  Hemodynamic parameters for last 24 hours:    Intake/Output from previous day: 12/11 0701 - 12/12 0700 In: 1300 [I.V.:1200; IV Piggyback:100] Out: 813 [Urine:750; Blood:13; Chest Tube:50] Intake/Output this shift: No intake/output data recorded.  General appearance: alert, cooperative, and no distress Heart: regular rate and rhythm Lungs: clear to auscultation bilaterally Abdomen: benign Extremities: SCD in place, no edema or calf tenderness Wound: incis healing well  Lab Results: Recent Labs    03/14/24 1124 03/17/24 0258  WBC 3.8* 7.8  HGB 12.9 10.8*  HCT 40.4 32.6*  PLT 327 260   BMET:  Recent Labs    03/14/24 1124 03/17/24 0258  NA 141 133*  K 4.2 3.5  CL 105 101  CO2 30 26  GLUCOSE 89 101*  BUN 11 9  CREATININE 0.60 0.58  CALCIUM  9.3 8.7*    PT/INR:  Recent Labs    03/14/24 1124  LABPROT 14.1  INR 1.0   ABG No results found for: PHART, HCO3, TCO2, ACIDBASEDEF, O2SAT CBG (last 3)  Recent Labs    03/16/24 1500  GLUCAP 148*    Meds Scheduled Meds:  acetaminophen   1,000 mg Oral Q6H   Or   acetaminophen  (TYLENOL ) oral liquid 160 mg/5 mL  1,000 mg Oral Q6H   amLODipine   5 mg Oral Daily   atorvastatin   40 mg Oral Daily   bisacodyl  10 mg Oral Daily   enoxaparin (LOVENOX) injection  40 mg Subcutaneous Daily   ketorolac  15 mg Intravenous Q6H   pantoprazole   40 mg Oral Daily    senna-docusate  1 tablet Oral QHS   Continuous Infusions: PRN Meds:.albuterol , fentaNYL  (SUBLIMAZE ) injection, ondansetron  (ZOFRAN ) IV, traMADol  Xrays DG Chest Port 1 View Result Date: 03/16/2024 CLINICAL DATA:  Mass right lung. EXAM: PORTABLE CHEST 1 VIEW COMPARISON:  07/14/2023 and CT 02/24/2024 FINDINGS: Interval right upper lobe with resection with suture line over the right upper lobe. Right apical chest tube in place. Lungs are adequately inflated. Subtle patchy density over the right upper lobe likely postsurgical. No effusion or pneumothorax. Cardiomediastinal silhouette and remainder of the exam is unchanged. IMPRESSION: Interval right upper lobe wedge resection with right apical chest tube in place. No pneumothorax. Electronically Signed   By: Toribio Agreste M.D.   On: 03/16/2024 17:57   DG C-ARM BRONCHOSCOPY Result Date: 03/16/2024 C-ARM BRONCHOSCOPY: Fluoroscopy was utilized by the requesting physician.  No radiographic interpretation.    Assessment/Plan: S/P Procedures (LRB): RIGHT UPPER LOBE ROBOT-ASSISTED WEDGE RESECTION THORACOSCOPIC (Right) BLOCK, NERVE, INTERCOSTAL (Right) LYMPH NODE BIOPSY (Right) POD#1  1 afeb, s BP 99-144 range, sinus rhythm- resuming norvasc  2 O2 sats good on RA 3 Chest tube- 50 ml drainage recorded, no air leak- will d/c Chest tube 4 adeq UOP 5 CXR- tiny right apical pntx 6 normal renal fxn 7 expected ABLA, H/H 10/32- observe 8 cont pulm hygiene and rehab- poss home later today     LOS: 1 day    [place] E  Bonetta Mostek PA-C pager [phone] 03/17/2024

## 2024-03-18 ENCOUNTER — Inpatient Hospital Stay (HOSPITAL_COMMUNITY)

## 2024-03-18 ENCOUNTER — Other Ambulatory Visit (HOSPITAL_COMMUNITY): Payer: Self-pay

## 2024-03-18 LAB — CBC
HCT: 36.1 % (ref 36.0–46.0)
Hemoglobin: 12.1 g/dL (ref 12.0–15.0)
MCH: 31.6 pg (ref 26.0–34.0)
MCHC: 33.5 g/dL (ref 30.0–36.0)
MCV: 94.3 fL (ref 80.0–100.0)
Platelets: 270 K/uL (ref 150–400)
RBC: 3.83 MIL/uL — ABNORMAL LOW (ref 3.87–5.11)
RDW: 13.3 % (ref 11.5–15.5)
WBC: 7.5 K/uL (ref 4.0–10.5)
nRBC: 0 % (ref 0.0–0.2)

## 2024-03-18 LAB — COMPREHENSIVE METABOLIC PANEL WITH GFR
ALT: 25 U/L (ref 0–44)
AST: 32 U/L (ref 15–41)
Albumin: 3.3 g/dL — ABNORMAL LOW (ref 3.5–5.0)
Alkaline Phosphatase: 59 U/L (ref 38–126)
Anion gap: 9 (ref 5–15)
BUN: 6 mg/dL — ABNORMAL LOW (ref 8–23)
CO2: 28 mmol/L (ref 22–32)
Calcium: 8.9 mg/dL (ref 8.9–10.3)
Chloride: 98 mmol/L (ref 98–111)
Creatinine, Ser: 0.64 mg/dL (ref 0.44–1.00)
GFR, Estimated: >60 mL/min (ref >60)
Glucose, Bld: 97 mg/dL (ref 70–99)
Potassium: 3.7 mmol/L (ref 3.5–5.1)
Sodium: 135 mmol/L (ref 135–145)
Total Bilirubin: 0.7 mg/dL (ref 0.0–1.2)
Total Protein: 6.7 g/dL (ref 6.5–8.1)

## 2024-03-18 MED ORDER — ONDANSETRON HCL 4 MG PO TABS
4.0000 mg | ORAL_TABLET | Freq: Three times a day (TID) | ORAL | 1 refills | Status: AC | PRN
  Filled 2024-03-18: qty 30, 10d supply, fill #0

## 2024-03-18 MED ORDER — TRAMADOL HCL 50 MG PO TABS
50.0000 mg | ORAL_TABLET | Freq: Four times a day (QID) | ORAL | 0 refills | Status: AC | PRN
  Filled 2024-03-18: qty 28, 7d supply, fill #0

## 2024-03-18 NOTE — Progress Notes (Addendum)
° °   °  [address] [place] [address]             [phone]      2 Days Post-Op Procedures (LRB): RIGHT UPPER LOBE ROBOT-ASSISTED WEDGE RESECTION THORACOSCOPIC (Right) BLOCK, NERVE, INTERCOSTAL (Right) LYMPH NODE BIOPSY (Right) Subjective: Patient reports she was able to keep down jello this AM, she reports her nausea is better than yesterday.   Objective: Vital signs in last 24 hours: Temp:  [98.3 F (36.8 C)-99.1 F (37.3 C)] 99.1 F (37.3 C) (12/13 0819) Pulse Rate:  [64-70] 70 (12/13 0819) Cardiac Rhythm: Normal sinus rhythm (12/13 0819) Resp:  [12-21] 21 (12/13 0819) BP: (112-129)/(73-87) 128/78 (12/13 0850) SpO2:  [95 %-97 %] 95 % (12/13 0819)  Hemodynamic parameters for last 24 hours:    Intake/Output from previous day: 12/12 0701 - 12/13 0700 In: 1080 [P.O.:1080] Out: -  Intake/Output this shift: No intake/output data recorded.  General appearance: alert, cooperative, and no distress Neurologic: intact Heart: regular rate and rhythm, S1, S2 normal, no murmur, click, rub or gallop Lungs: clear to auscultation bilaterally Abdomen: soft, non-tender; bowel sounds normal; no masses,  no organomegaly Extremities: extremities normal, atraumatic, no cyanosis or edema Wound: Clean and dry without sign of infection  Lab Results: Recent Labs    03/17/24 0258 03/18/24 0418  WBC 7.8 7.5  HGB 10.8* 12.1  HCT 32.6* 36.1  PLT 260 270   BMET:  Recent Labs    03/17/24 0258 03/18/24 0418  NA 133* 135  K 3.5 3.7  CL 101 98  CO2 26 28  GLUCOSE 101* 97  BUN 9 6*  CREATININE 0.58 0.64  CALCIUM  8.7* 8.9    PT/INR: No results for input(s): LABPROT, INR in the last 72 hours. ABG No results found for: PHART, HCO3, TCO2, ACIDBASEDEF, O2SAT CBG (last 3)  Recent Labs    03/16/24 1500  GLUCAP 148*    Assessment/Plan: S/P Procedures (LRB): RIGHT UPPER LOBE ROBOT-ASSISTED WEDGE RESECTION THORACOSCOPIC (Right) BLOCK,  NERVE, INTERCOSTAL (Right) LYMPH NODE BIOPSY (Right)  Neuro: Pain controlled  CV: NSR, HR 70s. BP stable.   Pulm: Saturating well on RA. CT removed yesterday. CXR without pneumothorax. Encourage IS and ambulation.   GI: Nausea and vomiting, has not been able to eat anything other than jello. Reports nausea is better this AM. Continue Reglan , zofran  , compazine  and stool softeners. Benign abdominal exam. Needs to tolerate a diet prior to discharge.   Renal: Cr stable, 0.64.   ID: No leukocytosis, Tmax 99.1 on 1 occasion, otherwise 98.   Expected postop ABLA: Resolved, H/H 12.1/36.1. Not clinically significant.   DVT Prophylaxis: Lovenox   Dispo: Patient needs to tolerate a diet prior to discharge, reports nausea was better this AM. If she tolerates a good lunch will plan to d/c home this afternoon.    LOS: 2 days    Con GORMAN Bend, PA-C 03/18/2024  Patient seen and examined, agree findings and plan outlined above Home when nausea improved and taking Po  Arnice Vanepps C. Kerrin, MD [facility] [phone]

## 2024-03-18 NOTE — Discharge Instructions (Signed)
 Discharge Instructions:  1. You may shower, please wash incisions daily with soap and water and keep dry.  If you wish to cover wounds with dressing you may do so but please keep clean and change daily.  No tub baths or swimming until incisions have completely healed.  If your incisions become red or develop any drainage please call our office at [phone]  2. No Driving until cleared by Dr. Lang office and you are no longer using narcotic pain medications  3. Fever of 101.5 for at least 24 hours with no source, please contact our office at [phone]  4. Activity- up as tolerated, please walk at least 3 times per day.  Avoid strenuous activity until cleared by Dr. Lang office.  5. If any questions or concerns arise, please do not hesitate to contact our office at [phone]

## 2024-03-18 NOTE — Plan of Care (Signed)
°  Problem: Education: Goal: Knowledge of General Education information will improve Description: Including pain rating scale, medication(s)/side effects and non-pharmacologic comfort measures Outcome: Progressing   Problem: Health Behavior/Discharge Planning: Goal: Ability to manage health-related needs will improve Outcome: Progressing   Problem: Clinical Measurements: Goal: Will remain free from infection Outcome: Progressing   Problem: Coping: Goal: Level of anxiety will decrease Outcome: Progressing   Problem: Elimination: Goal: Will not experience complications related to bowel motility Outcome: Progressing Goal: Will not experience complications related to urinary retention Outcome: Progressing

## 2024-03-19 ENCOUNTER — Other Ambulatory Visit: Payer: Self-pay | Admitting: Family Medicine

## 2024-03-19 ENCOUNTER — Encounter (HOSPITAL_COMMUNITY): Payer: Self-pay | Admitting: Thoracic Surgery (Cardiothoracic Vascular Surgery)

## 2024-03-19 DIAGNOSIS — Z902 Acquired absence of lung [part of]: Secondary | ICD-10-CM

## 2024-03-19 NOTE — TOC Progression Note (Signed)
 Transition of Care [facility]) - Progression Note    Patient Details  Name: Tasha Nguyen MRN: [id number] Date of Birth: March 06, 1956  Transition of [facility]) CM/SW Contact  Robynn Eileen Hoose, RN Phone Number: 03/19/2024, 9:32 AM  Clinical Narrative:   Rolling walker order sent to Jermaine with [employer] to be delivered to pt bedside prior to discharging home.                      Expected Discharge Plan and Services                         DME Arranged: Walker rolling DME Agency: [employer] Date DME Agency Contacted: 03/19/24 Time DME Agency Contacted: [phone] Representative spoke with at DME Agency: [place]             Social Drivers of Health (SDOH) Interventions SDOH Screenings   Food Insecurity: No Food Insecurity (03/16/2024)  Housing: Low Risk (03/16/2024)  Transportation Needs: No Transportation Needs (03/16/2024)  Utilities: Not At Risk (03/16/2024)  Depression (PHQ2-9): Low Risk (02/07/2024)  Social Connections: Moderately Integrated (03/16/2024)  Tobacco Use: Medium Risk (03/16/2024)    Readmission Risk Interventions     No data to display

## 2024-03-19 NOTE — Progress Notes (Signed)
° °   °  [address] [place] [address]             [phone]      3 Days Post-Op Procedures (LRB): RIGHT UPPER LOBE ROBOT-ASSISTED WEDGE RESECTION THORACOSCOPIC (Right) BLOCK, NERVE, INTERCOSTAL (Right) LYMPH NODE BIOPSY (Right) Subjective: Patient reports she was walking the halls this morning and feels good. She denies nausea.   Objective: Vital signs in last 24 hours: Temp:  [98 F (36.7 C)-99.4 F (37.4 C)] 98.3 F (36.8 C) (12/14 0727) Pulse Rate:  [75-78] 78 (12/14 0727) Cardiac Rhythm: Normal sinus rhythm (12/14 0700) Resp:  [17-24] 17 (12/14 0727) BP: (105-133)/(68-90) 128/83 (12/14 0727) SpO2:  [94 %-96 %] 94 % (12/14 0727)  Hemodynamic parameters for last 24 hours:    Intake/Output from previous day: No intake/output data recorded. Intake/Output this shift: Total I/O In: 120 [P.O.:120] Out: -   General appearance: alert, cooperative, and no distress Neurologic: intact Heart: regular rate and rhythm, S1, S2 normal, no murmur, click, rub or gallop Lungs: clear to auscultation bilaterally Abdomen: soft, non-tender; bowel sounds normal; no masses,  no organomegaly Extremities: extremities normal, atraumatic, no cyanosis or edema Wound: Clean and dry without sign of infection  Lab Results: Recent Labs    03/17/24 0258 03/18/24 0418  WBC 7.8 7.5  HGB 10.8* 12.1  HCT 32.6* 36.1  PLT 260 270   BMET:  Recent Labs    03/17/24 0258 03/18/24 0418  NA 133* 135  K 3.5 3.7  CL 101 98  CO2 26 28  GLUCOSE 101* 97  BUN 9 6*  CREATININE 0.58 0.64  CALCIUM  8.7* 8.9    PT/INR: No results for input(s): LABPROT, INR in the last 72 hours. ABG No results found for: PHART, HCO3, TCO2, ACIDBASEDEF, O2SAT CBG (last 3)  Recent Labs    03/16/24 1500  GLUCAP 148*    Assessment/Plan: S/P Procedures (LRB): RIGHT UPPER LOBE ROBOT-ASSISTED WEDGE RESECTION THORACOSCOPIC (Right) BLOCK, NERVE, INTERCOSTAL (Right) LYMPH NODE  BIOPSY (Right)  Neuro: Pain controlled   CV: NSR, HR 90s, ST up to 120 during ambulation. BP stable.    Pulm: Saturating well on RA. CT removed 12/12. CXR yesterday without pneumothorax. Encourage IS and ambulation.    GI: Nausea and vomiting resolved, tolerating a diet. +BM, now soft stools, will d/c stool softeners.    Renal: Cr stable, 0.64.    ID: No leukocytosis, Tmax 99.4. No sign of infection.    DVT Prophylaxis: Lovenox    Dispo: Plan to d/c home today    LOS: 3 days    Con GORMAN Bend, PA-C 03/19/2024

## 2024-03-19 NOTE — TOC Progression Note (Signed)
 Transition of Care [facility]) - Initial/Assessment Note    Patient Details  Name: Tasha Nguyen MRN: [id number] Date of Birth: [birth year]  [facility]) CM/SW Contact:    Britt JULIANNA Bennetts, LCSW Phone Number: 03/19/2024, 11:06 AM  Clinical Narrative:                 CSW received consult due to pt inquiring about financial assistance as she will be out of work.  Resources provided.        Patient Goals and CMS Choice            Expected Discharge Plan and Services         Expected Discharge Date: 03/19/24               DME Arranged: Vannie rolling DME Agency: [employer] Date DME Agency Contacted: 03/19/24 Time DME Agency Contacted: [phone] Representative spoke with at DME Agency: [place]            Prior Living Arrangements/Services                       Activities of Daily Living   ADL Screening (condition at time of admission) Independently performs ADLs?: Yes (appropriate for developmental age) Is the patient deaf or have difficulty hearing?: No Does the patient have difficulty seeing, even when wearing glasses/contacts?: Yes Does the patient have difficulty concentrating, remembering, or making decisions?: No  Permission Sought/Granted                  Emotional Assessment              Admission diagnosis:  Non-small cell cancer of right lung (HCC) [C34.91] Non-small cell cancer of right lung (HCC) [C34.91] Status post robot-assisted surgical procedure [Z98.890] Mass of right lung [R91.8] Patient Active Problem List   Diagnosis Date Noted   S/P Robotic Assisted Right Upper Lobe Wedge Resection 03/16/2024   Mass of right lung 03/16/2024   Nodule of upper lobe of right lung 01/03/2024   Chronic obstructive pulmonary disease (HCC) 06/21/2023   Alcohol abuse 05/19/2023   Pain in both hands 12/16/2022   Encounter for screening for lung cancer 12/16/2022   Gastroesophageal reflux disease without esophagitis 01/14/2022    Prediabetes 06/25/2021   Right shoulder pain 06/25/2021   Vision changes 04/21/2021   Hammer toe 04/21/2021   History of colonic polyps 02/01/2020   PTSD (post-traumatic stress disorder) 11/23/2019   Generalized anxiety disorder 11/23/2019   Psychophysiological insomnia 11/23/2019   Callus 10/23/2019   Mixed hyperlipidemia 08/16/2008   Essential hypertension 08/16/2008   PCP:  Delbert Clam, MD Pharmacy:   [id number] [place] - [address] [place] [address] Phone: [phone] Fax: [phone]  [facility] [address] [place] [address] Phone: [phone] Fax: [phone]     Social Drivers of Health (SDOH) Social History: SDOH Screenings   Food Insecurity: No Food Insecurity (03/16/2024)  Housing: Low Risk (03/16/2024)  Transportation Needs: No Transportation Needs (03/16/2024)  Utilities: Not At Risk (03/16/2024)  Depression (PHQ2-9): Low Risk (02/07/2024)  Social Connections: Moderately Integrated (03/16/2024)  Tobacco Use: Medium Risk (03/16/2024)   SDOH Interventions:     Readmission Risk Interventions     No data to display

## 2024-03-20 ENCOUNTER — Telehealth: Payer: Self-pay | Admitting: *Deleted

## 2024-03-20 NOTE — Transitions of Care (Post Inpatient/ED Visit) (Signed)
 03/20/2024  Name: Tasha Nguyen MRN: [id number] DOB: 1955/05/24  Today's TOC FU Call Status: Today's TOC FU Call Status:: Successful TOC FU Call Completed TOC FU Call Complete Date: 03/20/24  Patient's Name and Date of Birth confirmed. DOB, Name  Transition Care Management Follow-up Telephone Call Date of Discharge: 03/19/24 Discharge Facility: [facility]) Type of Discharge: Inpatient Admission Primary Inpatient Discharge Diagnosis:: S/P Robotic Assisted Right Upper Lobe Wedge Resection How have you been since you were released from the hospital?: Same Any questions or concerns?: No  Items Reviewed: Did you receive and understand the discharge instructions provided?: Yes Medications obtained,verified, and reconciled?: Yes (Medications Reviewed) Dietary orders reviewed?: Yes Type of Diet Ordered:: Heart Healthy Do you have support at home?: Yes People in Home [RPT]: parent(s) Name of Support/Comfort Primary Source: Staying with her Mother for about a week  Medications Reviewed Today: Medications Reviewed Today     Reviewed by Lucky Andrea LABOR, RN (Registered Nurse) on 03/20/24 at 1539  Med List Status: <None>   Medication Order Taking? Sig Documenting Provider Last Dose Status Informant  acetaminophen  (TYLENOL ) 500 MG tablet [id number] Yes Take 500-1,000 mg by mouth every 6 (six) hours as needed (pain.). [provider]  Active Self  albuterol  (VENTOLIN  HFA) 108 (90 Base) MCG/ACT inhaler [id number] Yes Inhale 2 puffs into the lungs every 6 (six) hours as needed for wheezing or shortness of breath. Newlin, Enobong, MD  Active Self  amLODipine  (NORVASC ) 5 MG tablet [id number] Yes Take 1 tablet (5 mg total) by mouth daily. Newlin, Enobong, MD  Active Self  atorvastatin  (LIPITOR) 40 MG tablet [id number] Yes Take 1 tablet (40 mg total) by mouth daily. Newlin, Enobong, MD  Active Self  ondansetron  (ZOFRAN ) 4 MG tablet [id number] Yes Take 1 tablet (4 mg total) by mouth every 8  (eight) hours as needed for nausea or vomiting. Raguel Con RAMAN, PA-C  Active   pantoprazole  (PROTONIX ) 40 MG tablet [id number] Yes TAKE 1 TABLET BY MOUTH EVERY DAY Newlin, Enobong, MD  Active   traMADol  (ULTRAM ) 50 MG tablet [id number] Yes Take 1 tablet (50 mg total) by mouth every 6 (six) hours as needed (mild pain). Raguel Con RAMAN, PA-C  Active             Home Care and Equipment/Supplies: Were Home Health Services Ordered?: No Any new equipment or medical supplies ordered?: Yes Name of Medical supply agency?: [employer] Were you able to get the equipment/medical supplies?: Yes Do you have any questions related to the use of the equipment/supplies?: No  Functional Questionnaire: Do you need assistance with bathing/showering or dressing?: No Do you need assistance with meal preparation?: No Do you need assistance with eating?: No Do you have difficulty maintaining continence: No Do you need assistance with getting out of bed/getting out of a chair/moving?: No Do you have difficulty managing or taking your medications?: No  Follow up appointments reviewed: PCP Follow-up appointment confirmed?: No (RNCM assisted with scheduling on 04/13/24-first available, patietn preferred this office) MD Provider Line Number:[phone] Given: No Specialist Hospital Follow-up appointment confirmed?: Yes Date of Specialist follow-up appointment?: 03/22/24 Follow-Up Specialty Provider:: Dr. Mohamed/Oncology Do you need transportation to your follow-up appointment?: No Do you understand care options if your condition(s) worsen?: Yes-patient verbalized understanding  SDOH Interventions Today    Flowsheet Row Most Recent Value  SDOH Interventions   Food Insecurity Interventions Intervention Not Indicated  Housing Interventions Intervention Not Indicated  Transportation Interventions Intervention Not Indicated  Utilities Interventions  Intervention Not Indicated    Goals Addressed              This Visit's Progress    VBCI Transitions of Care (TOC) Care Plan       Problems:  Recent Hospitalization for treatment of s/p robotic assisted right upper lobe wedge resection  Goal:  Over the next 30 days, the patient will not experience hospital readmission  Interventions:  Transitions of Care: Durable Medical Equipment (DME) reviewed with patient/caregiver and delivery confirmed Doctor Visits  - discussed the importance of doctor visits Post discharge activity limitations prescribed by provider reviewed Post-op wound/incision care reviewed with patient/caregiver Reviewed Signs and symptoms of infection Assisted with scheduling PCP hospital follow up on 04/14/23-first available in PCP office, patient declined scheduling at an alternate office Reviewed using incentive spirometer Reviewed upcoming appointments with Dr. Sherrod on 03/22/24 and Dr. Shyrl for post op on 04/07/24  Patient Self Care Activities:  Attend all scheduled provider appointments Call provider office for new concerns or questions  Notify RN Care Manager of [facility] call rescheduling needs Participate in Transition of Care Program/Attend [facility] scheduled calls Take medications as prescribed    Plan:  Telephone follow up appointment with care management team member scheduled for:  03/28/24 at 1:45pm       Discussed and offered 30 day TOC program.  Patient       enrolled .  The patient has been provided with contact information for the care management team and has been advised to call with any health -related questions or concerns.  The patient verbalized understanding with current plan of care.  The patient is directed to their insurance card regarding availability of benefits coverage.   Andrea Dimes RN, BSN [place]  [facility] RN Care Manager [phone]

## 2024-03-21 ENCOUNTER — Inpatient Hospital Stay: Attending: Internal Medicine | Admitting: Licensed Clinical Social Worker

## 2024-03-21 DIAGNOSIS — C3411 Malignant neoplasm of upper lobe, right bronchus or lung: Secondary | ICD-10-CM | POA: Insufficient documentation

## 2024-03-21 DIAGNOSIS — C3491 Malignant neoplasm of unspecified part of right bronchus or lung: Secondary | ICD-10-CM

## 2024-03-21 NOTE — Progress Notes (Signed)
 [facility] CSW Progress Note   Interventions: CSW spoke w/ pt over the phone regarding financial concerns.  At this time pt has undergone surgery and is hopeful surgery will be the only necessary treatment.  Pt expressed concern about paying her bills while out of work.  CSW referred pt to [employer] for financial support.  If additional treatment is recommended CSW will work w/ pt to apply for SNAP benefits and if approved provide instructions to apply for the [employer].  Pt requesting a food bag which will be left at reception for pt following her appt on 12/17.      Follow Up Plan:  Patient will contact CSW with any support or resource needs    Tasha JONELLE Manna, LCSW Clinical Social Worker [facility]

## 2024-03-22 ENCOUNTER — Telehealth: Payer: Self-pay | Admitting: Internal Medicine

## 2024-03-22 ENCOUNTER — Inpatient Hospital Stay: Attending: Internal Medicine

## 2024-03-22 ENCOUNTER — Inpatient Hospital Stay: Admitting: Internal Medicine

## 2024-03-22 ENCOUNTER — Encounter: Payer: Self-pay | Admitting: Licensed Clinical Social Worker

## 2024-03-22 VITALS — BP 118/82 | HR 90 | Temp 98.2°F | Resp 17 | Ht 69.0 in | Wt 122.0 lb

## 2024-03-22 DIAGNOSIS — C3490 Malignant neoplasm of unspecified part of unspecified bronchus or lung: Secondary | ICD-10-CM

## 2024-03-22 DIAGNOSIS — C3411 Malignant neoplasm of upper lobe, right bronchus or lung: Secondary | ICD-10-CM | POA: Diagnosis present

## 2024-03-22 LAB — CMP (CANCER CENTER ONLY)
ALT: 50 U/L — ABNORMAL HIGH (ref 0–44)
AST: 39 U/L (ref 15–41)
Albumin: 4.1 g/dL (ref 3.5–5.0)
Alkaline Phosphatase: 88 U/L (ref 38–126)
Anion gap: 10 (ref 5–15)
BUN: 5 mg/dL — ABNORMAL LOW (ref 8–23)
CO2: 27 mmol/L (ref 22–32)
Calcium: 9.5 mg/dL (ref 8.9–10.3)
Chloride: 104 mmol/L (ref 98–111)
Creatinine: 0.68 mg/dL (ref 0.44–1.00)
GFR, Estimated: >60 mL/min (ref >60)
Glucose, Bld: 124 mg/dL — ABNORMAL HIGH (ref 70–99)
Potassium: 4.1 mmol/L (ref 3.5–5.1)
Sodium: 140 mmol/L (ref 135–145)
Total Bilirubin: 0.3 mg/dL (ref 0.0–1.2)
Total Protein: 7.4 g/dL (ref 6.5–8.1)

## 2024-03-22 LAB — CBC WITH DIFFERENTIAL (CANCER CENTER ONLY)
Abs Immature Granulocytes: 0.01 K/uL (ref 0.00–0.07)
Basophils Absolute: 0 K/uL (ref 0.0–0.1)
Basophils Relative: 1 %
Eosinophils Absolute: 0.1 K/uL (ref 0.0–0.5)
Eosinophils Relative: 2 %
HCT: 37 % (ref 36.0–46.0)
Hemoglobin: 12.2 g/dL (ref 12.0–15.0)
Immature Granulocytes: 0 %
Lymphocytes Relative: 34 %
Lymphs Abs: 1.7 K/uL (ref 0.7–4.0)
MCH: 31 pg (ref 26.0–34.0)
MCHC: 33 g/dL (ref 30.0–36.0)
MCV: 93.9 fL (ref 80.0–100.0)
Monocytes Absolute: 0.5 K/uL (ref 0.1–1.0)
Monocytes Relative: 9 %
Neutro Abs: 2.8 K/uL (ref 1.7–7.7)
Neutrophils Relative %: 54 %
Platelet Count: 345 K/uL (ref 150–400)
RBC: 3.94 MIL/uL (ref 3.87–5.11)
RDW: 13.6 % (ref 11.5–15.5)
WBC Count: 5.1 K/uL (ref 4.0–10.5)
nRBC: 0 % (ref 0.0–0.2)

## 2024-03-22 NOTE — Progress Notes (Signed)
 [facility] Telephone:([phone]) [phone]   Fax:([phone]) [phone]  OFFICE PROGRESS NOTE  Tasha Clam, MD [address] [place] [address]  DIAGNOSIS: Stage IA (T1b, N0, M0) non-small cell lung cancer, adenocarcinoma diagnosed initially in October 2025  PRIOR THERAPY: Status post right upper lobe wedge resection with hilar and mediastinal lymph node sampling under the care of Dr. Shyrl on March 16, 2024.  CURRENT THERAPY: Observation.  INTERVAL HISTORY: Tasha Nguyen 68 y.o. female returns to the clinic today for follow-up visit accompanied by her sister.  Discussed the use of AI scribe software for clinical note transcription with the patient, who gave verbal consent to proceed.  History of Present Illness Tasha Nguyen is a 68 year old female with stage 1A non-small cell lung adenocarcinoma status post right upper lobe wedge resection who presents for post-operative evaluation.  She was diagnosed with stage 1A non-small cell lung adenocarcinoma in October 2025 and underwent right upper lobe wedge resection with mediastinal and hilar lymph node sampling one week ago. Pathology from the surgery showed a 1.1 cm tumor, and the patient recalls being told there was no lymph node involvement or evidence of metastatic disease. She recalls being told that her cancer stage remains at 1A after surgery.  Post-operatively, she reports expected pain localized to the right surgical site. She describes a single nocturnal episode of sensation of internal pulling, attributed to scar tissue changes. She denies dyspnea, new cough, or other respiratory symptoms. No other new symptoms have occurred.  She expressed uncertainty regarding the timing of surgical bandage removal and plans to clarify this with her surgical team. Her sister was present for support during the visit.     MEDICAL HISTORY: Past Medical History:  Diagnosis Date   Cancer (HCC) 02/07/2024    Stage I non-small cell lung cancer, specifically adenocarcinoma, located in the right upper lobe   Colon polyps    Dyspnea    occasional with exertion - has inhalers   GERD (gastroesophageal reflux disease)    on protonix    History of kidney stones    passed stone   HLD (hyperlipidemia)    on lipitor   Hypertension    on amlodipine    Lung nodules    right lung   Pneumonia    x 1 as a child   Pre-diabetes 10/18/2023   Seasonal allergies    on zyrtec     ALLERGIES:  is allergic to hydrocodone-acetaminophen , oxycodone, and oxycodone-acetaminophen .  MEDICATIONS:  Current Outpatient Medications  Medication Sig Dispense Refill   acetaminophen  (TYLENOL ) 500 MG tablet Take 500-1,000 mg by mouth every 6 (six) hours as needed (pain.).     albuterol  (VENTOLIN  HFA) 108 (90 Base) MCG/ACT inhaler Inhale 2 puffs into the lungs every 6 (six) hours as needed for wheezing or shortness of breath. 8 g 2   amLODipine  (NORVASC ) 5 MG tablet Take 1 tablet (5 mg total) by mouth daily. 90 tablet 1   atorvastatin  (LIPITOR) 40 MG tablet Take 1 tablet (40 mg total) by mouth daily. 90 tablet 1   ondansetron  (ZOFRAN ) 4 MG tablet Take 1 tablet (4 mg total) by mouth every 8 (eight) hours as needed for nausea or vomiting. 30 tablet 1   pantoprazole  (PROTONIX ) 40 MG tablet TAKE 1 TABLET BY MOUTH EVERY DAY 90 tablet 0   traMADol  (ULTRAM ) 50 MG tablet Take 1 tablet (50 mg total) by mouth every 6 (six) hours as needed (mild pain). 28 tablet 0  No current facility-administered medications for this visit.    SURGICAL HISTORY:  Past Surgical History:  Procedure Laterality Date   COLONOSCOPY  2021   hx polyps - repeat q60yrs   INTERCOSTAL NERVE BLOCK Right 03/16/2024   Procedure: BLOCK, NERVE, INTERCOSTAL;  Surgeon: Shyrl Linnie KIDD, MD;  Location: [facility] OR;  Service: Thoracic;  Laterality: Right;   LYMPH NODE BIOPSY Right 03/16/2024   Procedure: LYMPH NODE BIOPSY;  Surgeon: Shyrl Linnie KIDD, MD;  Location:  [facility] OR;  Service: Thoracic;  Laterality: Right;   MULTIPLE TOOTH EXTRACTIONS     dentures   VIDEO BRONCHOSCOPY WITH ENDOBRONCHIAL NAVIGATION Right 01/17/2024   Procedure: VIDEO BRONCHOSCOPY WITH ENDOBRONCHIAL NAVIGATION;  Surgeon: Shelah Lamar RAMAN, MD;  Location: [facility] ENDOSCOPY;  Service: Pulmonary;  Laterality: Right;   VIDEO BRONCHOSCOPY WITH ENDOBRONCHIAL NAVIGATION N/A 03/16/2024   Procedure: VIDEO BRONCHOSCOPY WITH ENDOBRONCHIAL NAVIGATION;  Surgeon: Shyrl Linnie KIDD, MD;  Location: [facility] ENDOSCOPY;  Service: Thoracic;  Laterality: N/A;  Robotic bronch for marking   WEDGE RESECTION, LUNG, ROBOT-ASSISTED, THORACOSCOPIC Right 03/16/2024   Procedure: RIGHT UPPER LOBE ROBOT-ASSISTED WEDGE RESECTION THORACOSCOPIC;  Surgeon: Shyrl Linnie KIDD, MD;  Location: [facility] OR;  Service: Thoracic;  Laterality: Right;  Right robotic RUL wedge    REVIEW OF SYSTEMS:  Constitutional: positive for fatigue Eyes: negative Ears, nose, mouth, throat, and face: negative Respiratory: positive for pleurisy/chest pain Cardiovascular: negative Gastrointestinal: negative Genitourinary:negative Integument/breast: negative Hematologic/lymphatic: negative Musculoskeletal:negative Neurological: negative Behavioral/Psych: negative Endocrine: negative Allergic/Immunologic: negative   PHYSICAL EXAMINATION: General appearance: alert, cooperative, fatigued, and no distress Head: Normocephalic, without obvious abnormality, atraumatic Neck: no adenopathy, no JVD, supple, symmetrical, trachea midline, and thyroid  not enlarged, symmetric, no tenderness/mass/nodules Lymph nodes: Cervical, supraclavicular, and axillary nodes normal. Resp: clear to auscultation bilaterally Back: symmetric, no curvature. ROM normal. No CVA tenderness. Cardio: regular rate and rhythm, S1, S2 normal, no murmur, click, rub or gallop GI: soft, non-tender; bowel sounds normal; no masses,  no organomegaly Extremities: extremities normal, atraumatic,  no cyanosis or edema Neurologic: Alert and oriented X 3, normal strength and tone. Normal symmetric reflexes. Normal coordination and gait  ECOG PERFORMANCE STATUS: 1 - Symptomatic but completely ambulatory  Blood pressure 118/82, pulse 90, temperature 98.2 F (36.8 C), temperature source Temporal, resp. rate 17, height 5' 9 (1.753 m), weight 122 lb (55.3 kg), SpO2 99%.  LABORATORY DATA: Lab Results  Component Value Date   WBC 5.1 03/22/2024   HGB 12.2 03/22/2024   HCT 37.0 03/22/2024   MCV 93.9 03/22/2024   PLT 345 03/22/2024      Chemistry      Component Value Date/Time   NA 135 03/18/2024 0418   NA 142 10/18/2023 0946   K 3.7 03/18/2024 0418   CL 98 03/18/2024 0418   CO2 28 03/18/2024 0418   BUN 6 (L) 03/18/2024 0418   BUN 10 10/18/2023 0946   CREATININE 0.64 03/18/2024 0418   CREATININE 0.68 02/07/2024 1320      Component Value Date/Time   CALCIUM  8.9 03/18/2024 0418   ALKPHOS 59 03/18/2024 0418   AST 32 03/18/2024 0418   AST 17 02/07/2024 1320   ALT 25 03/18/2024 0418   ALT 15 02/07/2024 1320   BILITOT 0.7 03/18/2024 0418   BILITOT 0.5 02/07/2024 1320       RADIOGRAPHIC STUDIES: DG Chest 2 View Result Date: 03/18/2024 EXAM: 2 VIEW(S) XRAY OF THE CHEST 03/18/2024 05:17:16 AM COMPARISON: Portable chest yesterday at 11:38 AM. CLINICAL HISTORY: Pneumothorax. FINDINGS: LUNGS AND PLEURA: Postsurgical  changes in the right upper lobe are again noted. There is a minimal layering left pleural effusion. Linear atelectasis in the lateral left base. No other focal pulmonary opacity is identified. There is no measurable pneumothorax. Overall aeration seems unchanged. HEART AND MEDIASTINUM: No acute abnormality of the cardiac and mediastinal silhouettes. BONES AND SOFT TISSUES: Osteopenia. No acute osseous abnormality. IMPRESSION: 1. No measurable pneumothorax. 2. Minimal layering left pleural effusion. 3. Postsurgical changes in the right upper lobe. Electronically signed by:  Francis Quam MD 03/18/2024 06:27 AM EST RP Workstation: [id number]   DG Chest 2 View Result Date: 03/17/2024 EXAM: 2 VIEW(S) XRAY OF THE CHEST 03/14/2024 12:27:29 PM COMPARISON: 01/17/2024 CLINICAL HISTORY: Pre-op evaluation FINDINGS: LUNGS AND PLEURA: Fiducial marker/biopsy clip in the right lung apex. Linear atelectasis or scar at left lung base. No pleural effusion. No pneumothorax. HEART AND MEDIASTINUM: No acute abnormality of the cardiac and mediastinal silhouettes. BONES AND SOFT TISSUES: No acute osseous abnormality. IMPRESSION: 1. Fiducial marker/biopsy clip in the right lung apex. Electronically signed by: Pinkie Pebbles MD 03/17/2024 08:44 PM EST RP Workstation: [id number]   DG Chest 1 View Result Date: 03/17/2024 EXAM: 1 VIEW(S) XRAY OF THE CHEST 03/17/2024 11:47:00 AM COMPARISON: 03/17/2024 CLINICAL HISTORY: Encounter for chest tube removal FINDINGS: LINES, TUBES AND DEVICES: Right chest tube removed. LUNGS AND PLEURA: Suture lines over right upper lung. Chronic left basilar atelectasis/scarring. No pleural effusion. No pneumothorax. HEART AND MEDIASTINUM: No acute abnormality of the cardiac and mediastinal silhouettes. BONES AND SOFT TISSUES: No acute osseous abnormality. IMPRESSION: 1. Right chest tube removal. 2. No pneumothorax. Electronically signed by: Pinkie Pebbles MD 03/17/2024 08:43 PM EST RP Workstation: [id number]   DG Chest Port 1 View Result Date: 03/17/2024 CLINICAL DATA:  Right lung mass.  Chest tube. EXAM: PORTABLE CHEST 1 VIEW COMPARISON:  03/16/2024 FINDINGS: Right-sided chest tube is in similar position. No evidence for pneumothorax. Lungs are hyperexpanded. Mild chronic interstitial coarsening. Staple line overlies the right upper lung. The cardiopericardial silhouette is within normal limits for size. Telemetry leads overlie the chest. IMPRESSION: Right-sided chest tube in similar position without evidence for pneumothorax. Electronically Signed   By: Camellia Candle  M.D.   On: 03/17/2024 07:41   DG Chest Port 1 View Result Date: 03/16/2024 CLINICAL DATA:  Mass right lung. EXAM: PORTABLE CHEST 1 VIEW COMPARISON:  07/14/2023 and CT 02/24/2024 FINDINGS: Interval right upper lobe with resection with suture line over the right upper lobe. Right apical chest tube in place. Lungs are adequately inflated. Subtle patchy density over the right upper lobe likely postsurgical. No effusion or pneumothorax. Cardiomediastinal silhouette and remainder of the exam is unchanged. IMPRESSION: Interval right upper lobe wedge resection with right apical chest tube in place. No pneumothorax. Electronically Signed   By: Toribio Agreste M.D.   On: 03/16/2024 17:57   DG C-ARM BRONCHOSCOPY Result Date: 03/16/2024 C-ARM BRONCHOSCOPY: Fluoroscopy was utilized by the requesting physician.  No radiographic interpretation.   CT Super D Chest Wo Contrast Result Date: 02/29/2024 EXAM: CT CHEST WITHOUT CONTRAST 02/24/2024 08:24:00 AM TECHNIQUE: CT of the chest was performed without the administration of intravenous contrast. Multiplanar reformatted images are provided for review. Automated exposure control, iterative reconstruction, and/or weight based adjustment of the mA/kV was utilized to reduce the radiation dose to as low as reasonably achievable. COMPARISON: Plain film of 01/17/2024. CT of 12/10/2023. PET of 01/06/2024. CLINICAL HISTORY: Adenocarcinoma on biopsy of right upper lobe mixed attenuation nodule.pre surgical planning. * Tracking Code: BO * FINDINGS: MEDIASTINUM: Normal  heart size. Minimal anterior pericardial effusion, likely physiologic. Left main and LAD coronary artery calcification. Normal aortic caliber. Aortic atherosclerosis (ICD10-I70.0). The central airways are clear. LYMPH NODES: No mediastinal adenopathy. Hilar regions poorly evaluated without IV contrast. No axillary lymphadenopathy. LUNGS AND PLEURA: Advanced paraseptal and centrilobular emphysema (ICD10-J43.9). In the  right upper lobe, at the site of a mixed attenuation nodule, there is a prominent fiducial, presumably from prior bronchoscopy sampling. The underlying mixed attenuation nodule measures 7 x 6 mm on series 23, image 302, and is similar to on the prior. Bibasilar scarring. A subpleural right lower lobe 4 mm nodule on series 99, image 302, is unchanged. No focal consolidation or pulmonary edema. No pleural effusion or pneumothorax. SOFT TISSUES/BONES: No acute abnormality of the bones or soft tissues. UPPER ABDOMEN: Limited images of the upper abdomen demonstrate no acute abnormality. Normal adrenal glands. IMPRESSION: 1. Stable 7 x 6 mm mixed attenuation nodule in the posterior right upper lobe with fiducial marker, unchanged. 2. No thoracic adenopathy or evidence of metastatic disease. 3. Incidental findings, including: Aortic atherosclerosis (icd10-i70.0), coronary artery atherosclerosis, and emphysema (icd10-j43.9). Electronically signed by: Rockey Kilts MD 02/29/2024 09:17 AM EST RP Workstation: [id number]    ASSESSMENT AND PLAN: This is a very pleasant 68 years old African-American female recently diagnosed with a stage IA (T1b, N0, M0) non-small cell lung cancer, adenocarcinoma presented with right upper lobe lung nodule status post right upper lobe wedge resection with lymph node sampling on March 16, 2024 under the care of Dr. Shyrl. The patient is slowly recovering from her recent surgery. Assessment and Plan Assessment & Plan Stage IA non-small cell lung adenocarcinoma, status post right upper lobe wedge resection She is in the observation period following right upper lobe wedge resection for stage IA non-small cell lung adenocarcinoma. Pathology revealed a 1.1 cm tumor without lymph node involvement or metastatic disease. She is recovering appropriately post-operatively with expected incisional pain and no new respiratory symptoms. There is no evidence of recurrence or progression. - Provided  reassurance regarding expected post-surgical symptoms and scar changes. - Advised to contact the surgical team for guidance on bandage removal and wound care. - Surgeon to continue post-operative follow-up for the next several months. - Scheduled surveillance visits every six months for two years, then annually. - No adjuvant chemotherapy or radiation indicated. She was advised to call immediately if she has any other concerning symptoms in the interval. The patient voices understanding of current disease status and treatment options and is in agreement with the current care plan.  All questions were answered. The patient knows to call the clinic with any problems, questions or concerns. We can certainly see the patient much sooner if necessary.  The total time spent in the appointment was 30 minutes including review of chart and various tests results, discussions about plan of care and coordination of care plan .   Disclaimer: This note was dictated with voice recognition software. Similar sounding words can inadvertently be transcribed and may not be corrected upon review.

## 2024-03-22 NOTE — Telephone Encounter (Signed)
 Scheduled patient for next appointment in 6 months. Called and spoke with the patient, she is aware.

## 2024-03-23 ENCOUNTER — Other Ambulatory Visit: Payer: Self-pay

## 2024-03-23 NOTE — Progress Notes (Signed)
 The proposed treatment discussed in conference is for discussion purpose only and is not a binding recommendation.  The patients have not been physically examined, or presented with their treatment options.  Therefore, final treatment plans cannot be decided.

## 2024-03-24 ENCOUNTER — Other Ambulatory Visit: Payer: Self-pay | Admitting: Thoracic Surgery (Cardiothoracic Vascular Surgery)

## 2024-03-24 DIAGNOSIS — R911 Solitary pulmonary nodule: Secondary | ICD-10-CM

## 2024-03-28 ENCOUNTER — Telehealth: Admitting: *Deleted

## 2024-03-28 NOTE — Patient Instructions (Addendum)
 Visit Information  Thank you for taking time to visit with me today. Please don't hesitate to contact me if I can be of assistance to you before our next scheduled telephone appointment.   Following is a copy of your care plan:   Goals Addressed             This Visit's Progress    VBCI Transitions of Care (TOC) Care Plan       Problems:  Recent Hospitalization for treatment of s/p robotic assisted right upper lobe wedge resection  Goal:  Over the next 30 days, the patient will not experience hospital readmission  Interventions:  Transitions of Care: Doctor Visits  - discussed the importance of doctor visits Post discharge activity limitations prescribed by provider reviewed Post-op wound/incision care reviewed with patient/caregiver Reviewed Signs and symptoms of infection Reviewed using incentive spirometer, advised to continue breathing exercises Reviewed provider notes and discussed Reviewed upcoming appointments with Dr. Shyrl for post op on 04/07/24 and PCP on 04/13/24  Patient Self Care Activities:  Attend all scheduled provider appointments Call provider office for new concerns or questions  Notify RN Care Manager of [facility] call rescheduling needs Participate in Transition of Care Program/Attend [facility] scheduled calls Take medications as prescribed    Plan:  Telephone follow up appointment with care management team member scheduled for:  04/11/24 at 10am        Patient verbalizes understanding of instructions and care plan provided today and agrees to view in MyChart. Active MyChart status and patient understanding of how to access instructions and care plan via MyChart confirmed with patient.     Telephone follow up appointment with care management team member scheduled for:04/11/24 at 10am  Please call the care guide team at [phone] if you need to cancel or reschedule your appointment.   Please call [phone]-TALK (toll free, 24 hour hotline) go to [facility] Urgent [facility] [address] [place] [phone]) call 911 if you are experiencing a Mental Health or Behavioral Health Crisis or need someone to talk to.  Andrea Dimes RN, BSN [place]  Value-Based Care Institute [facility] RN Care Manager [phone]

## 2024-03-28 NOTE — Transitions of Care (Post Inpatient/ED Visit) (Signed)
 " Transition of Care week 2  Visit Note  03/28/2024  Name: Tasha Nguyen MRN: [id number]          DOB: 18-Sep-1955  Situation: Patient enrolled in [facility] 30-day program. Visit completed with Tasha Nguyen by telephone.   Background:   Initial Transition Care Management Follow-up Telephone Call Discharge Date and Diagnosis: 03/19/24, S/P Robotic Assisted Right Upper Lobe Wedge Resection   Past Medical History:  Diagnosis Date   Cancer (HCC) 02/07/2024   Stage I non-small cell lung cancer, specifically adenocarcinoma, located in the right upper lobe   Colon polyps    Dyspnea    occasional with exertion - has inhalers   GERD (gastroesophageal reflux disease)    on protonix    History of kidney stones    passed stone   HLD (hyperlipidemia)    on lipitor   Hypertension    on amlodipine    Lung nodules    right lung   Pneumonia    x 1 as a child   Pre-diabetes 10/18/2023   Seasonal allergies    on zyrtec     Assessment: Patient Reported Symptoms: Cognitive Cognitive Status: Able to follow simple commands, Alert and oriented to person, place, and time, Normal speech and language skills      Neurological Neurological Review of Symptoms: No symptoms reported    HEENT HEENT Symptoms Reported: No symptoms reported      Cardiovascular Cardiovascular Symptoms Reported: No symptoms reported    Respiratory Respiratory Symptoms Reported: No symptoms reported Respiratory Self-Management Outcome: 4 (good)  Endocrine Endocrine Symptoms Reported: No symptoms reported    Gastrointestinal Gastrointestinal Symptoms Reported: No symptoms reported Gastrointestinal Self-Management Outcome: 4 (good)    Genitourinary Genitourinary Symptoms Reported: No symptoms reported    Integumentary Integumentary Symptoms Reported: Incision Skin Self-Management Outcome: 4 (good) Skin Comment: Post op appointment on 04/07/24.  Musculoskeletal Musculoskelatal Symptoms Reviewed: No symptoms reported         Psychosocial Psychosocial Symptoms Reported: Not assessed         There were no vitals filed for this visit. Pain Score: 2  Pain Type: Surgical pain Pain Location: Abdomen Pain Orientation: Right Pain Descriptors / Indicators: Sore  Medications Reviewed Today     Reviewed by Lucky Andrea LABOR, RN (Registered Nurse) on 03/28/24 at 1427  Med List Status: <None>   Medication Order Taking? Sig Documenting Provider Last Dose Status Informant  acetaminophen  (TYLENOL ) 500 MG tablet [id number] Yes Take 500-1,000 mg by mouth every 6 (six) hours as needed (pain.). [provider]  Active Self  albuterol  (VENTOLIN  HFA) 108 (90 Base) MCG/ACT inhaler [id number] Yes Inhale 2 puffs into the lungs every 6 (six) hours as needed for wheezing or shortness of breath. Newlin, Enobong, MD  Active Self  amLODipine  (NORVASC ) 5 MG tablet [id number] Yes Take 1 tablet (5 mg total) by mouth daily. Newlin, Enobong, MD  Active Self  atorvastatin  (LIPITOR) 40 MG tablet [id number] Yes Take 1 tablet (40 mg total) by mouth daily. Newlin, Enobong, MD  Active Self  ondansetron  (ZOFRAN ) 4 MG tablet [id number]  Take 1 tablet (4 mg total) by mouth every 8 (eight) hours as needed for nausea or vomiting.  Patient not taking: Reported on 03/28/2024   Tasha Nguyen  Active   pantoprazole  (PROTONIX ) 40 MG tablet [id number] Yes TAKE 1 TABLET BY MOUTH EVERY DAY Newlin, Enobong, MD  Active   traMADol  (ULTRAM ) 50 MG tablet [id number] Yes Take 1 tablet (50 mg total) by mouth every 6 (six)  hours as needed (mild pain). Tasha Nguyen  Active             Recommendation:   Continue Current Plan of Care  Follow Up Plan:   Telephone follow-up in 1 week  Andrea Dimes RN, BSN [place]  [facility] RN Care Manager [phone]     "

## 2024-04-07 ENCOUNTER — Ambulatory Visit (HOSPITAL_COMMUNITY)
Admission: RE | Admit: 2024-04-07 | Discharge: 2024-04-07 | Disposition: A | Discharge status: Home / Self Care | Source: Ambulatory Visit | Attending: Internal Medicine | Admitting: Internal Medicine

## 2024-04-07 ENCOUNTER — Ambulatory Visit: Payer: Self-pay | Admitting: Thoracic Surgery (Cardiothoracic Vascular Surgery)

## 2024-04-07 VITALS — BP 165/96 | HR 79 | Resp 20 | Ht 69.0 in | Wt 125.7 lb

## 2024-04-07 DIAGNOSIS — Z9889 Other specified postprocedural states: Secondary | ICD-10-CM

## 2024-04-07 DIAGNOSIS — R911 Solitary pulmonary nodule: Secondary | ICD-10-CM | POA: Diagnosis present

## 2024-04-07 NOTE — Progress Notes (Signed)
" ° °   °  [address] [place] [address]             [phone]      Tasha Nguyen [facility] Medical Record #[id number] Date of Birth: 15-Dec-1955  Referring: Tasha Sherrod, MD Primary Care: Tasha Clam, MD Primary Cardiologist:None  Reason for visit:   follow-up  History of Present Illness:     Tasha Nguyen presents for their first follow-up appointment.  Overall, she is doing well.    Physical Exam: BP (!) 165/96 (BP Location: Left Arm, Patient Position: Sitting, Cuff Size: Normal)   Pulse 79   Resp 20   Ht 5' 9 (1.753 m)   Wt 125 lb 11.2 oz (57 kg)   SpO2 98% Comment: RA  BMI 18.56 kg/m   Alert NAD Incision clean.   Abdomen, ND no peripheral edema   Diagnostic Studies & Laboratory data:  Path:  FINAL MICROSCOPIC DIAGNOSIS:  A. LUNG, RIGHT UPPER LOBE, WEDGE RESECTION: Adenocarcinoma, acinar and lipidic 1.1 cm (pT1b). Carcinoma focally less than 1 mm from inked staple line margin. Negative for pleural involvement. Negative for lymphovascular involvement. 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 See oncology table.  B. LYMPH NODE, LEVER 4R, EXCISION: One lymph node negative for metastatic carcinoma (0/1).  C. LYMPH NODE, LEVER 7, EXCISION: One lymph node negative for metastatic carcinoma (0/1).  D. LYMPH NODE, HILAR, EXCISION: One lymph node negative for metastatic carcinoma (0/1).  E. LUNG, RIGHT UPPER LOBE ADDITIONAL MARGIN, RESECTION: Benign lung with emphysematous changes and respiratory bronchiolitis (smokers bronchiolitis). Negative for carcinoma.  ONCOLOGY TABLE:  LUNG: Resection Synchronous Tumors: Not applicable Total Number of Primary Tumors: 1 Procedure: Right upper lobe wedge resection and lymph node biopsies Specimen Laterality: Right Tumor Focality: Unifocal Tumor Site: Right upper lobe Tumor Size: 1.1 x 0.8 x 0.8 cm      Total Tumor Size: 1.1 x 0.8 x 0.8 cm      Invasive Tumor Size: 0.8  cm Histologic Type: Adenocarcinoma, acinar (70%) and lepidic (30%) Visceral Pleura Invasion: Not identified Direct Invasion of Adjacent Structures: Not applicable Lymphovascular Invasion: Not identified Margins: All margins negative for invasive carcinoma Treatment Effect: No known presurgical therapy Regional Lymph Nodes:      Number of Lymph Nodes Involved: 0                           Nodal Sites with Tumor: 0      Number of Lymph Nodes Examined: 3                      Nodal Sites Examined: 4R, 7 and hilar Distant Metastasis:      Distant Site(s) Involved: Not applicable Pathologic Stage Classification (pTNM, AJCC 8th Edition): pT1b, pN0     Assessment / Plan:   69 y.o. female s/p right upper lobectomy for T1b N0 M0 adenocarcinoma.  I have made a referral to medical oncology.  Tasha Nguyen 04/07/2024 4:09 PM  "

## 2024-04-11 ENCOUNTER — Other Ambulatory Visit: Payer: Self-pay | Admitting: *Deleted

## 2024-04-11 NOTE — Patient Instructions (Signed)
 Visit Information  Thank you for taking time to visit with me today. Please don't hesitate to contact me if I can be of assistance to you before our next scheduled telephone appointment.   Following is a copy of your care plan:   Goals Addressed             This Visit's Progress    [facility] Transitions of Care (TOC) Care Plan       Problems:  Recent Hospitalization for treatment of s/p robotic assisted right upper lobe wedge resection  Goal:  Over the next 30 days, the patient will not experience hospital readmission  Interventions:  Transitions of Care: Doctor Visits  - discussed the importance of doctor visits Post discharge activity limitations prescribed by provider reviewed Post-op wound/incision care reviewed with patient/caregiver Reviewed Signs and symptoms of infection Reviewed using incentive spirometer, advised to continue breathing exercises-revisited Reviewed provider notes and discussed Reviewed upcoming appointments with PCP on 04/13/24 Medications reviewed  Patient Self Care Activities:  Attend all scheduled provider appointments Call provider office for new concerns or questions  Notify RN Care Manager of [facility] call rescheduling needs Participate in Transition of Care Program/Attend [facility] scheduled calls Take medications as prescribed    Plan:  Telephone follow up appointment with care management team member scheduled for:  04/18/24 at 10:30am        Patient verbalizes understanding of instructions and care plan provided today and agrees to view in MyChart. Active MyChart status and patient understanding of how to access instructions and care plan via MyChart confirmed with patient.     Telephone follow up appointment with care management team member scheduled for:04/18/24 at 10:30am  Please call the care guide team at [phone] if you need to cancel or reschedule your appointment.   Please call [phone]-TALK (toll free, 24 hour hotline) go to [facility] Urgent [facility] [address], [place] [phone]) call 911 if you are experiencing a Mental Health or Behavioral Health Crisis or need someone to talk to.  Andrea Dimes RN, BSN [place]  [facility] RN Care Manager [phone]

## 2024-04-11 NOTE — Transitions of Care (Post Inpatient/ED Visit) (Signed)
 " Transition of Care week 3  Visit Note  04/11/2024  Name: Tasha Nguyen MRN: [id number]          DOB: 08-04-1955  Situation: Patient enrolled in [facility] 30-day program. Visit completed with Tasha Nguyen by telephone.   Background:   Initial Transition Care Management Follow-up Telephone Call Discharge Date and Diagnosis: 03/19/24, S/P Robotic Assisted Right Upper Lobe Wedge Resection   Past Medical History:  Diagnosis Date   Cancer (HCC) 02/07/2024   Stage I non-small cell lung cancer, specifically adenocarcinoma, located in the right upper lobe   Colon polyps    Dyspnea    occasional with exertion - has inhalers   GERD (gastroesophageal reflux disease)    on protonix    History of kidney stones    passed stone   HLD (hyperlipidemia)    on lipitor   Hypertension    on amlodipine    Lung nodules    right lung   Pneumonia    x 1 as a child   Pre-diabetes 10/18/2023   Seasonal allergies    on zyrtec     Assessment: Patient Reported Symptoms: Cognitive Cognitive Status: Able to follow simple commands, Alert and oriented to person, place, and time, Normal speech and language skills      Neurological Neurological Review of Symptoms: No symptoms reported    HEENT HEENT Symptoms Reported: No symptoms reported      Cardiovascular Cardiovascular Symptoms Reported: No symptoms reported    Respiratory Respiratory Symptoms Reported: No symptoms reported Additional Respiratory Details: Advised to continue using incentive spirometer. Released by Dr. Shyrl on 04/07/24 Respiratory Management Strategies: Adequate rest, Routine screening Respiratory Self-Management Outcome: 4 (good)  Endocrine Endocrine Symptoms Reported: No symptoms reported    Gastrointestinal Gastrointestinal Symptoms Reported: No symptoms reported      Genitourinary Genitourinary Symptoms Reported: No symptoms reported    Integumentary Integumentary Symptoms Reported: Incision Additional Integumentary Details:  surgical incisions healing nicely, evaluated by Dr. Shyrl on 04/07/24 Skin Self-Management Outcome: 4 (good)  Musculoskeletal Musculoskelatal Symptoms Reviewed: No symptoms reported   Falls in the past year?: No    Psychosocial Psychosocial Symptoms Reported: Not assessed         There were no vitals filed for this visit.    Medications Reviewed Today     Reviewed by Lucky Andrea LABOR, RN (Registered Nurse) on 04/11/24 at 1347  Med List Status: <None>   Medication Order Taking? Sig Documenting Provider Last Dose Status Informant  acetaminophen  (TYLENOL ) 500 MG tablet [id number] Yes Take 500-1,000 mg by mouth every 6 (six) hours as needed (pain.). [provider]  Active Self  albuterol  (VENTOLIN  HFA) 108 (90 Base) MCG/ACT inhaler [id number] Yes Inhale 2 puffs into the lungs every 6 (six) hours as needed for wheezing or shortness of breath. Newlin, Enobong, MD  Active Self  amLODipine  (NORVASC ) 5 MG tablet [id number] Yes Take 1 tablet (5 mg total) by mouth daily. Newlin, Enobong, MD  Active Self  atorvastatin  (LIPITOR) 40 MG tablet [id number] Yes Take 1 tablet (40 mg total) by mouth daily. Newlin, Enobong, MD  Active Self  ondansetron  (ZOFRAN ) 4 MG tablet [id number] Yes Take 1 tablet (4 mg total) by mouth every 8 (eight) hours as needed for nausea or vomiting. Raguel Benders S, PA-C  Active   pantoprazole  (PROTONIX ) 40 MG tablet [id number] Yes TAKE 1 TABLET BY MOUTH EVERY DAY Newlin, Enobong, MD  Active   traMADol  (ULTRAM ) 50 MG tablet [id number] Yes Take 1 tablet (50 mg total) by mouth  every 6 (six) hours as needed (mild pain). Raguel Con RAMAN, [place]  Active             Recommendation:   Continue Current Plan of Care  Follow Up Plan:   Telephone follow-up in 1 week  Andrea Dimes RN, BSN [place]  [facility] RN Care Manager [phone]     "

## 2024-04-12 ENCOUNTER — Telehealth: Payer: Self-pay | Admitting: Family Medicine

## 2024-04-12 NOTE — Telephone Encounter (Signed)
 Pt confirmed appt (per vr)

## 2024-04-13 ENCOUNTER — Encounter: Payer: Self-pay | Admitting: *Deleted

## 2024-04-13 ENCOUNTER — Ambulatory Visit: Payer: Self-pay | Attending: *Deleted | Admitting: *Deleted

## 2024-04-13 VITALS — BP 137/84 | HR 76 | Ht 69.0 in | Wt 124.8 lb

## 2024-04-13 DIAGNOSIS — I10 Essential (primary) hypertension: Secondary | ICD-10-CM

## 2024-04-13 DIAGNOSIS — Z9889 Other specified postprocedural states: Secondary | ICD-10-CM

## 2024-04-13 MED ORDER — BLOOD PRESSURE MONITORING KIT: 1.0000 | PACK | Freq: Every day | 0 refills | Status: AC

## 2024-04-13 NOTE — Assessment & Plan Note (Signed)
 As reviewed above she is healing well from her robotic assisted right upper lobe wedge resection for history of mass in the right lung. Wounds are healing well.

## 2024-04-13 NOTE — Patient Instructions (Signed)
 Today we discussed your recovery from your right upper lobe resection for a mass in your right lung. Your wounds are healing well and your lungs and heart sound good today. You will make a follow-up appointment with your primary care provider today

## 2024-04-13 NOTE — Progress Notes (Signed)
 "   Patient ID: Tasha Nguyen, female    DOB: 10-08-1955  MRN: [id number]  CC: Hospitalization Follow-up (Request for BP monitor, but insurance may not cover.)   Subjective: Tasha Nguyen is a 69 y.o. female who presents for hospital follow-up status post robotic assisted right upper lobe wedge resection for mass in the right lung on 03/19/2024.  Per EPIC review, she is a previous smoker and was involved in lung cancer screening program.  Needle aspiration revealed adenocarcinoma. It was felt that due to lung function she would be a better candidate for wedge resection versus lobectomy.  She tolerated the procedure and was medically stable when she was discharged home.  Today, she is doing quite Nguyen in her recovery.  She asked for me to check the wounds on her back for signs of healing if she is unable to see these areas herself.   Hypertension, benign essential Her blood pressure is under good control today.  No side effects from medications.  Struggles to adhere to a heart healthy diet and exercise daily. Request home blood pressure monitor  Her concerns today include: Recovering from recent surgery.  Obtaining home blood pressure monitor    Patient Active Problem List   Diagnosis Date Noted   S/P Robotic Assisted Right Upper Lobe Wedge Resection 03/16/2024   Mass of right lung 03/16/2024   Nodule of upper lobe of right lung 01/03/2024   Chronic obstructive pulmonary disease (HCC) 06/21/2023   Alcohol abuse 05/19/2023   Pain in both hands 12/16/2022   Encounter for screening for lung cancer 12/16/2022   Gastroesophageal reflux disease without esophagitis 01/14/2022   Prediabetes 06/25/2021   Right shoulder pain 06/25/2021   Vision changes 04/21/2021   Hammer toe 04/21/2021   History of colonic polyps 02/01/2020   PTSD (post-traumatic stress disorder) 11/23/2019   Generalized anxiety disorder 11/23/2019   Psychophysiological insomnia 11/23/2019   Callus 10/23/2019   Mixed  hyperlipidemia 08/16/2008   Essential hypertension 08/16/2008     Medications Ordered Prior to Encounter[1]  Allergies[2]  Social History   Socioeconomic History   Marital status: Single    Spouse name: Not on file   Number of children: Not on file   Years of education: Not on file   Highest education level: Not on file  Occupational History   Not on file  Tobacco Use   Smoking status: Former    Current packs/day: 0.00    Average packs/day: 1 pack/day for 26.1 years (26.1 ttl pk-yrs)    Types: Cigarettes    Start date: 04/18/1987    Quit date: 05/12/2013    Years since quitting: 10.9   Smokeless tobacco: Never  Vaping Use   Vaping status: Never Used  Substance and Sexual Activity   Alcohol use: Not Currently    Alcohol/week: 10.0 standard drinks of alcohol    Types: 10 Standard drinks or equivalent per week    Comment: HX weekends-beers/ wine, none since 01/2024   Drug use: Yes    Types: Marijuana    Comment: last use was in 01/2024   Sexual activity: Not Currently    Birth control/protection: Post-menopausal  Other Topics Concern   Not on file  Social History Narrative   Not on file   Social Drivers of Health   Tobacco Use: Medium Risk (04/13/2024)   Patient History    Smoking Tobacco Use: Former    Smokeless Tobacco Use: Never    Passive Exposure: Not on Actuary Strain:  Not on file  Food Insecurity: Food Insecurity Present (03/22/2024)   Epic    Worried About Programme Researcher, Broadcasting/film/video in the Last Year: Sometimes true    Ran Out of Food in the Last Year: Sometimes true  Transportation Needs: No Transportation Needs (03/20/2024)   Epic    Lack of Transportation (Medical): No    Lack of Transportation (Non-Medical): No  Physical Activity: Not on file  Stress: Not on file  Social Connections: Moderately Integrated (03/16/2024)   Social Connection and Isolation Panel    Frequency of Communication with Friends and Family: More than three times a week     Frequency of Social Gatherings with Friends and Family: Twice a week    Attends Religious Services: More than 4 times per year    Active Member of Clubs or Organizations: Yes    Attends Banker Meetings: More than 4 times per year    Marital Status: Never married  Intimate Partner Violence: Not At Risk (03/20/2024)   Epic    Fear of Current or Ex-Partner: No    Emotionally Abused: No    Physically Abused: No    Sexually Abused: No  Depression (PHQ2-9): Low Risk (03/20/2024)   Depression (PHQ2-9)    PHQ-2 Score: 0  Alcohol Screen: Not on file  Housing: Unknown (03/20/2024)   Epic    Unable to Pay for Housing in the Last Year: No    Number of Times Moved in the Last Year: Not on file    Homeless in the Last Year: Not on file  Utilities: Not At Risk (03/20/2024)   Epic    Threatened with loss of utilities: No  Health Literacy: Not on file    Family History  Problem Relation Age of Onset   Diabetes Mother    Ovarian cancer Mother 8   CAD Father    CAD Sister    Diabetes Sister    Diabetes Sister    Breast cancer Cousin 22   Colon cancer Neg Hx    Esophageal cancer Neg Hx    Stomach cancer Neg Hx    Pancreatic cancer Neg Hx    Liver disease Neg Hx    Rectal cancer Neg Hx    BRCA 1/2 Neg Hx     Past Surgical History:  Procedure Laterality Date   COLONOSCOPY  2021   hx polyps - repeat q75yrs   INTERCOSTAL NERVE BLOCK Right 03/16/2024   Procedure: BLOCK, NERVE, INTERCOSTAL;  Surgeon: Shyrl Linnie KIDD, MD;  Location: [facility] OR;  Service: Thoracic;  Laterality: Right;   LYMPH NODE BIOPSY Right 03/16/2024   Procedure: LYMPH NODE BIOPSY;  Surgeon: Shyrl Linnie KIDD, MD;  Location: [facility] OR;  Service: Thoracic;  Laterality: Right;   MULTIPLE TOOTH EXTRACTIONS     dentures   VIDEO BRONCHOSCOPY WITH ENDOBRONCHIAL NAVIGATION Right 01/17/2024   Procedure: VIDEO BRONCHOSCOPY WITH ENDOBRONCHIAL NAVIGATION;  Surgeon: Shelah Lamar RAMAN, MD;  Location: [facility] END[facility];   Service: Pulmonary;  Laterality: Right;   VIDEO BRONCHOSCOPY WITH ENDOBRONCHIAL NAVIGATION N/A 03/16/2024   Procedure: VIDEO BRONCHOSCOPY WITH ENDOBRONCHIAL NAVIGATION;  Surgeon: Shyrl Linnie KIDD, MD;  Location: [facility];  Service: Thoracic;  Laterality: N/A;  Robotic bronch for marking   WEDGE RESECTION, LUNG, ROBOT-ASSISTED, THORACOSCOPIC Right 03/16/2024   Procedure: RIGHT UPPER LOBE ROBOT-ASSISTED WEDGE RESECTION THORACOSCOPIC;  Surgeon: Shyrl Linnie KIDD, MD;  Location: [facility] OR;  Service: Thoracic;  Laterality: Right;  Right robotic RUL wedge    ROS: Review of Systems  Negative except as stated above  PHYSICAL EXAM: BP 137/84 (BP Location: Left Arm, Patient Position: Sitting, Cuff Size: Normal)   Pulse 76   Ht 5' 9 (1.753 m)   Wt 124 lb 12.8 oz (56.6 kg)   SpO2 99%   BMI 18.43 kg/m   Physical Exam Vitals and nursing note reviewed.  Constitutional:      Appearance: Normal appearance.  Cardiovascular:     Rate and Rhythm: Normal rate and regular rhythm.  Pulmonary:     Effort: Pulmonary effort is normal.     Breath sounds: Normal breath sounds.  Abdominal:     General: Abdomen is flat. Bowel sounds are normal.     Palpations: Abdomen is soft.  Skin:    General: Skin is warm and dry.     Comments: Incision status post robotic assisted surgery are healing Nguyen with no signs of infection  Neurological:     Mental Status: She is alert and oriented to person, place, and time. Mental status is at baseline.     Gait: Gait normal.          Latest Ref Rng & Units 03/22/2024    2:40 PM 03/18/2024    4:18 AM 03/17/2024    2:58 AM  CMP  Glucose 70 - 99 mg/dL 875  97  898   BUN 8 - 23 mg/dL 5  6  9    Creatinine 0.44 - 1.00 mg/dL 9.31  9.35  9.41   Sodium 135 - 145 mmol/L 140  135  133   Potassium 3.5 - 5.1 mmol/L 4.1  3.7  3.5   Chloride 98 - 111 mmol/L 104  98  101   CO2 22 - 32 mmol/L 27  28  26    Calcium  8.9 - 10.3 mg/dL 9.5  8.9  8.7   Total Protein 6.5 -  8.1 g/dL 7.4  6.7    Total Bilirubin 0.0 - 1.2 mg/dL 0.3  0.7    Alkaline Phos 38 - 126 U/L 88  59    AST 15 - 41 U/L 39  32    ALT 0 - 44 U/L 50  25     Lipid Panel     Component Value Date/Time   CHOL 218 (H) 10/18/2023 0946   TRIG 92 10/18/2023 0946   HDL 54 10/18/2023 0946   CHOLHDL 4.4 11/24/2022 1653   CHOLHDL 7.2 06/08/2008 0125   VLDL 37 06/08/2008 0125   LDLCALC 148 (H) 10/18/2023 0946    CBC    Component Value Date/Time   WBC 5.1 03/22/2024 1440   WBC 7.5 03/18/2024 0418   RBC 3.94 03/22/2024 1440   HGB 12.2 03/22/2024 1440   HGB 14.3 12/17/2020 0935   HCT 37.0 03/22/2024 1440   HCT 43.1 12/17/2020 0935   PLT 345 03/22/2024 1440   PLT 380 12/17/2020 0935   MCV 93.9 03/22/2024 1440   MCV 96 12/17/2020 0935   MCH 31.0 03/22/2024 1440   MCHC 33.0 03/22/2024 1440   RDW 13.6 03/22/2024 1440   RDW 13.0 12/17/2020 0935   LYMPHSABS 1.7 03/22/2024 1440   LYMPHSABS 2.6 12/17/2020 0935   MONOABS 0.5 03/22/2024 1440   EOSABS 0.1 03/22/2024 1440   EOSABS 0.1 12/17/2020 0935   BASOSABS 0.0 03/22/2024 1440   BASOSABS 0.0 12/17/2020 0935    Results for orders placed or performed in visit on 03/22/24  CMP (Cancer Center only)   Collection Time: 03/22/24  2:40 PM  Result Value Ref  Range   Sodium 140 135 - 145 mmol/L   Potassium 4.1 3.5 - 5.1 mmol/L   Chloride 104 98 - 111 mmol/L   CO2 27 22 - 32 mmol/L   Glucose, Bld 124 (H) 70 - 99 mg/dL   BUN 5 (L) 8 - 23 mg/dL   Creatinine 9.31 9.55 - 1.00 mg/dL   Calcium  9.5 8.9 - 10.3 mg/dL   Total Protein 7.4 6.5 - 8.1 g/dL   Albumin 4.1 3.5 - 5.0 g/dL   AST 39 15 - 41 U/L   ALT 50 (H) 0 - 44 U/L   Alkaline Phosphatase 88 38 - 126 U/L   Total Bilirubin 0.3 0.0 - 1.2 mg/dL   GFR, Estimated >39 >39 mL/min   Anion gap 10 5 - 15  CBC with Differential (Cancer Center Only)   Collection Time: 03/22/24  2:40 PM  Result Value Ref Range   WBC Count 5.1 4.0 - 10.5 K/uL   RBC 3.94 3.87 - 5.11 MIL/uL   Hemoglobin 12.2 12.0 -  15.0 g/dL   HCT 62.9 63.9 - 53.9 %   MCV 93.9 80.0 - 100.0 fL   MCH 31.0 26.0 - 34.0 pg   MCHC 33.0 30.0 - 36.0 g/dL   RDW 86.3 88.4 - 84.4 %   Platelet Count 345 150 - 400 K/uL   nRBC 0.0 0.0 - 0.2 %   Neutrophils Relative % 54 %   Neutro Abs 2.8 1.7 - 7.7 K/uL   Lymphocytes Relative 34 %   Lymphs Abs 1.7 0.7 - 4.0 K/uL   Monocytes Relative 9 %   Monocytes Absolute 0.5 0.1 - 1.0 K/uL   Eosinophils Relative 2 %   Eosinophils Absolute 0.1 0.0 - 0.5 K/uL   Basophils Relative 1 %   Basophils Absolute 0.0 0.0 - 0.1 K/uL   Immature Granulocytes 0 %   Abs Immature Granulocytes 0.01 0.00 - 0.07 K/uL     ASSESSMENT AND PLAN:  Assessment & Plan S/P Robotic Assisted Right Upper Lobe Wedge Resection As reviewed above she is healing Nguyen from her robotic assisted right upper lobe wedge resection for history of mass in the right lung. Wounds are healing Nguyen.     Primary hypertension Blood pressure was at goal at check-in. She would like a blood pressure monitor in order to check her blood pressure at home. Okay for blood pressure monitor. She is aware that insurance may not cover this Orders:   Blood Pressure Monitoring KIT; 1 Device by Does not apply route daily.       Patient was given the opportunity to ask questions.  Patient verbalized understanding of the plan and was able to repeat key elements of the plan.   This documentation was completed using Paediatric nurse.  Any transcriptional errors are unintentional.     Requested Prescriptions    No prescriptions requested or ordered in this encounter    Return in about 3 months (around 07/12/2024) for with PCP.  Denny Mccree H, NP      [1]  Current Outpatient Medications on File Prior to Visit  Medication Sig Dispense Refill   acetaminophen  (TYLENOL ) 500 MG tablet Take 500-1,000 mg by mouth every 6 (six) hours as needed (pain.).     albuterol  (VENTOLIN  HFA) 108 (90 Base) MCG/ACT inhaler Inhale 2  puffs into the lungs every 6 (six) hours as needed for wheezing or shortness of breath. 8 g 2   amLODipine  (NORVASC ) 5 MG tablet Take 1 tablet (  5 mg total) by mouth daily. 90 tablet 1   atorvastatin  (LIPITOR) 40 MG tablet Take 1 tablet (40 mg total) by mouth daily. 90 tablet 1   ondansetron  (ZOFRAN ) 4 MG tablet Take 1 tablet (4 mg total) by mouth every 8 (eight) hours as needed for nausea or vomiting. 30 tablet 1   pantoprazole  (PROTONIX ) 40 MG tablet TAKE 1 TABLET BY MOUTH EVERY DAY 90 tablet 0   traMADol  (ULTRAM ) 50 MG tablet Take 1 tablet (50 mg total) by mouth every 6 (six) hours as needed (mild pain). 28 tablet 0   No current facility-administered medications on file prior to visit.  [2]  Allergies Allergen Reactions   Hydrocodone-Acetaminophen      Other reaction(s): Other (See Comments) hallicinations   Oxycodone Other (See Comments)    Hallucinations Other reaction(s): Other (See Comments) Hallucinations   Oxycodone-Acetaminophen      Other reaction(s): Other (See Comments) hallucinations   "

## 2024-04-18 ENCOUNTER — Other Ambulatory Visit: Payer: Self-pay | Admitting: *Deleted

## 2024-04-18 NOTE — Transitions of Care (Post Inpatient/ED Visit) (Signed)
 " Transition of Care week 4  Visit Note  04/18/2024  Name: Tasha Nguyen MRN: [id number]          DOB: 11/25/1955  Situation: Patient enrolled in [facility] 30-day program. Visit completed with Ms. Christon by telephone.   Background:   Initial Transition Care Management Follow-up Telephone Call Discharge Date and Diagnosis: 03/19/24, S/P Robotic Assisted Right Upper Lobe Wedge Resection   Past Medical History:  Diagnosis Date   Cancer (HCC) 02/07/2024   Stage I non-small cell lung cancer, specifically adenocarcinoma, located in the right upper lobe   Colon polyps    Dyspnea    occasional with exertion - has inhalers   GERD (gastroesophageal reflux disease)    on protonix    History of kidney stones    passed stone   HLD (hyperlipidemia)    on lipitor   Hypertension    on amlodipine    Lung nodules    right lung   Pneumonia    x 1 as a child   Pre-diabetes 10/18/2023   Seasonal allergies    on zyrtec     Assessment: Patient Reported Symptoms: Cognitive Cognitive Status: Able to follow simple commands, Alert and oriented to person, place, and time, Normal speech and language skills      Neurological Neurological Review of Symptoms: No symptoms reported    HEENT HEENT Symptoms Reported: No symptoms reported      Cardiovascular Cardiovascular Symptoms Reported: No symptoms reported Cardiovascular Self-Management Outcome: 4 (good)  Respiratory Respiratory Symptoms Reported: No symptoms reported Respiratory Self-Management Outcome: 4 (good)  Endocrine Endocrine Symptoms Reported: No symptoms reported    Gastrointestinal Gastrointestinal Symptoms Reported: No symptoms reported      Genitourinary Genitourinary Symptoms Reported: No symptoms reported    Integumentary Integumentary Symptoms Reported: Incision Additional Integumentary Details: surgical incisions continue to heal without issue, evalutated by PCP on 04/13/24 Skin Self-Management Outcome: 4 (good)  Musculoskeletal  Musculoskelatal Symptoms Reviewed: No symptoms reported Musculoskeletal Self-Management Outcome: 4 (good) Musculoskeletal Comment: Ambulating indepentley      Psychosocial Psychosocial Symptoms Reported: No symptoms reported         There were no vitals filed for this visit.    Medications Reviewed Today     Reviewed by Lucky Andrea LABOR, RN (Registered Nurse) on 04/18/24 at 1051  Med List Status: <None>   Medication Order Taking? Sig Documenting Provider Last Dose Status Informant  acetaminophen  (TYLENOL ) 500 MG tablet [id number] Yes Take 500-1,000 mg by mouth every 6 (six) hours as needed (pain.). [provider]  Active Self  albuterol  (VENTOLIN  HFA) 108 (90 Base) MCG/ACT inhaler [id number] Yes Inhale 2 puffs into the lungs every 6 (six) hours as needed for wheezing or shortness of breath. Newlin, Enobong, MD  Active Self  amLODipine  (NORVASC ) 5 MG tablet [id number] Yes Take 1 tablet (5 mg total) by mouth daily. Newlin, Enobong, MD  Active Self  atorvastatin  (LIPITOR) 40 MG tablet [id number] Yes Take 1 tablet (40 mg total) by mouth daily. Newlin, Enobong, MD  Active Self  Blood Pressure Monitoring KIT [id number]  1 Device by Does not apply route daily.  Patient not taking: Reported on 04/18/2024   Scarlett Ronal Caldron, NP  Active   ondansetron  (ZOFRAN ) 4 MG tablet [id number] Yes Take 1 tablet (4 mg total) by mouth every 8 (eight) hours as needed for nausea or vomiting. Raguel Con RAMAN, PA-C  Active   pantoprazole  (PROTONIX ) 40 MG tablet [id number] Yes TAKE 1 TABLET BY MOUTH EVERY DAY Newlin, Enobong, MD  Active   traMADol  (ULTRAM ) 50 MG tablet [id number] Yes Take 1 tablet (50 mg total) by mouth every 6 (six) hours as needed (mild pain). Raguel Con RAMAN, PA-C  Active             Recommendation:   Continue Current Plan of Care  Follow Up Plan:   Closing From:  Transitions of Care Program  Andrea Dimes RN, BSN [place]  [facility] RN  Care Manager [phone]     "

## 2024-04-18 NOTE — Patient Instructions (Signed)
 Visit Information  Thank you for taking time to visit with me today. Please don't hesitate to contact me if I can be of assistance to you before our next scheduled telephone appointment.   Following is a copy of your care plan:   Goals Addressed             This Visit's Progress    COMPLETED: [facility] Transitions of Care (TOC) Care Plan       Problems:  Recent Hospitalization for treatment of s/p robotic assisted right upper lobe wedge resection  Goal:  Over the next 30 days, the patient will not experience hospital readmission  Interventions:  Transitions of Care: Post-op wound/incision care reviewed with patient/caregiver Reviewed Signs and symptoms of infection Reviewed provider notes and discussed Medications reviewed Offered referral to Longitudinal Case Management-patient declined  Patient Self Care Activities:  Attend all scheduled provider appointments Call provider office for new concerns or questions  Notify RN Care Manager of TOC call rescheduling needs Participate in Transition of Care Program/Attend TOC scheduled calls Take medications as prescribed    Plan:  No further follow up required:          Patient verbalizes understanding of instructions and care plan provided today and agrees to view in MyChart. Active MyChart status and patient understanding of how to access instructions and care plan via MyChart confirmed with patient.     No further follow up required:    Please call the care guide team at [phone] if you need to cancel or reschedule your appointment.   Please call [phone]-TALK (toll free, 24 hour hotline) go to [facility] [address] [place] [phone]) call 911 if you are experiencing a Mental Health or Behavioral Health Crisis or need someone to talk to.  Andrea Dimes RN, BSN [place]  [facility] RN Care Manager [phone]

## 2024-04-19 ENCOUNTER — Other Ambulatory Visit: Payer: Self-pay | Admitting: Family Medicine

## 2024-04-19 DIAGNOSIS — Z1231 Encounter for screening mammogram for malignant neoplasm of breast: Secondary | ICD-10-CM

## 2024-05-08 ENCOUNTER — Ambulatory Visit

## 2024-06-01 ENCOUNTER — Ambulatory Visit

## 2024-06-08 ENCOUNTER — Other Ambulatory Visit (HOSPITAL_BASED_OUTPATIENT_CLINIC_OR_DEPARTMENT_OTHER)

## 2024-07-12 ENCOUNTER — Ambulatory Visit: Payer: Self-pay | Admitting: Family Medicine

## 2024-09-12 ENCOUNTER — Inpatient Hospital Stay

## 2024-09-20 ENCOUNTER — Inpatient Hospital Stay: Admitting: Internal Medicine
